# Patient Record
Sex: Female | Born: 1955 | Race: White | Hispanic: No | Marital: Married | Smoking: Former smoker
Health system: Southern US, Community
[De-identification: ages and names within clinical notes are randomized; demographics above are authoritative.]

## PROBLEM LIST (undated history)

## (undated) DIAGNOSIS — E039 Hypothyroidism, unspecified: Secondary | ICD-10-CM

## (undated) DIAGNOSIS — D649 Anemia, unspecified: Secondary | ICD-10-CM

## (undated) DIAGNOSIS — E785 Hyperlipidemia, unspecified: Secondary | ICD-10-CM

## (undated) DIAGNOSIS — I6529 Occlusion and stenosis of unspecified carotid artery: Secondary | ICD-10-CM

## (undated) DIAGNOSIS — F329 Major depressive disorder, single episode, unspecified: Secondary | ICD-10-CM

## (undated) DIAGNOSIS — I700 Atherosclerosis of aorta: Secondary | ICD-10-CM

## (undated) DIAGNOSIS — E559 Vitamin D deficiency, unspecified: Secondary | ICD-10-CM

## (undated) DIAGNOSIS — E8881 Metabolic syndrome: Secondary | ICD-10-CM

## (undated) DIAGNOSIS — I10 Essential (primary) hypertension: Secondary | ICD-10-CM

## (undated) DIAGNOSIS — F419 Anxiety disorder, unspecified: Secondary | ICD-10-CM

## (undated) DIAGNOSIS — M25569 Pain in unspecified knee: Secondary | ICD-10-CM

## (undated) DIAGNOSIS — K219 Gastro-esophageal reflux disease without esophagitis: Secondary | ICD-10-CM

## (undated) DIAGNOSIS — F32A Depression, unspecified: Secondary | ICD-10-CM

## (undated) DIAGNOSIS — R0600 Dyspnea, unspecified: Secondary | ICD-10-CM

## (undated) DIAGNOSIS — R3129 Other microscopic hematuria: Secondary | ICD-10-CM

## (undated) DIAGNOSIS — E079 Disorder of thyroid, unspecified: Secondary | ICD-10-CM

## (undated) DIAGNOSIS — M1712 Unilateral primary osteoarthritis, left knee: Secondary | ICD-10-CM

## (undated) HISTORY — DX: Pain in unspecified knee: M25.569

## (undated) HISTORY — DX: Occlusion and stenosis of unspecified carotid artery: I65.29

## (undated) HISTORY — PX: FOOT NEUROMA SURGERY: SHX646

## (undated) HISTORY — DX: Essential (primary) hypertension: I10

## (undated) HISTORY — DX: Metabolic syndrome: E88.810

## (undated) HISTORY — DX: Anxiety disorder, unspecified: F41.9

## (undated) HISTORY — DX: Depression, unspecified: F32.A

## (undated) HISTORY — DX: Other microscopic hematuria: R31.29

## (undated) HISTORY — DX: Vitamin D deficiency, unspecified: E55.9

## (undated) HISTORY — DX: Gastro-esophageal reflux disease without esophagitis: K21.9

## (undated) HISTORY — DX: Disorder of thyroid, unspecified: E07.9

## (undated) HISTORY — DX: Major depressive disorder, single episode, unspecified: F32.9

## (undated) HISTORY — DX: Metabolic syndrome: E88.81

## (undated) HISTORY — DX: Hyperlipidemia, unspecified: E78.5

## (undated) HISTORY — PX: COLONOSCOPY W/ POLYPECTOMY: SHX1380

## (undated) HISTORY — PX: APPENDECTOMY: SHX54

---

## 2005-12-05 LAB — HM PAP SMEAR

## 2007-10-01 ENCOUNTER — Ambulatory Visit: Payer: Self-pay | Admitting: Vascular Surgery

## 2009-06-15 HISTORY — PX: ENDOMETRIAL BIOPSY: SHX622

## 2011-02-13 LAB — HM DEXA SCAN

## 2012-12-27 ENCOUNTER — Ambulatory Visit: Payer: Self-pay | Admitting: Gastroenterology

## 2012-12-27 LAB — HM COLONOSCOPY

## 2013-11-18 ENCOUNTER — Ambulatory Visit: Payer: Self-pay

## 2015-07-13 ENCOUNTER — Other Ambulatory Visit: Payer: Self-pay

## 2015-07-13 NOTE — Telephone Encounter (Signed)
Patient called and stated she would run out of both of these medications before her appointment on 08/23/15. She would like enough to get her to the appointment. Pharmacy is [facility].

## 2015-07-14 MED ORDER — HYDROCHLOROTHIAZIDE 25 MG PO TABS: 25.0000 mg | ORAL_TABLET | Freq: Every day | ORAL | Status: DC

## 2015-07-14 MED ORDER — LOSARTAN POTASSIUM 50 MG PO TABS: 50.0000 mg | ORAL_TABLET | Freq: Every day | ORAL | Status: DC

## 2015-07-14 NOTE — Telephone Encounter (Signed)
Last labs were August 2015; reviewed One month approved

## 2015-08-06 DIAGNOSIS — I10 Essential (primary) hypertension: Secondary | ICD-10-CM

## 2015-08-06 DIAGNOSIS — F32A Depression, unspecified: Secondary | ICD-10-CM | POA: Insufficient documentation

## 2015-08-06 DIAGNOSIS — E559 Vitamin D deficiency, unspecified: Secondary | ICD-10-CM | POA: Insufficient documentation

## 2015-08-06 DIAGNOSIS — I6529 Occlusion and stenosis of unspecified carotid artery: Secondary | ICD-10-CM | POA: Insufficient documentation

## 2015-08-06 DIAGNOSIS — I129 Hypertensive chronic kidney disease with stage 1 through stage 4 chronic kidney disease, or unspecified chronic kidney disease: Secondary | ICD-10-CM | POA: Insufficient documentation

## 2015-08-06 DIAGNOSIS — E785 Hyperlipidemia, unspecified: Secondary | ICD-10-CM | POA: Insufficient documentation

## 2015-08-06 DIAGNOSIS — F419 Anxiety disorder, unspecified: Secondary | ICD-10-CM

## 2015-08-06 DIAGNOSIS — F411 Generalized anxiety disorder: Secondary | ICD-10-CM | POA: Insufficient documentation

## 2015-08-06 DIAGNOSIS — E063 Autoimmune thyroiditis: Secondary | ICD-10-CM | POA: Insufficient documentation

## 2015-08-06 DIAGNOSIS — E8881 Metabolic syndrome: Secondary | ICD-10-CM | POA: Insufficient documentation

## 2015-08-06 DIAGNOSIS — K219 Gastro-esophageal reflux disease without esophagitis: Secondary | ICD-10-CM | POA: Insufficient documentation

## 2015-08-06 DIAGNOSIS — E039 Hypothyroidism, unspecified: Secondary | ICD-10-CM | POA: Insufficient documentation

## 2015-08-06 DIAGNOSIS — M25569 Pain in unspecified knee: Secondary | ICD-10-CM | POA: Insufficient documentation

## 2015-08-06 DIAGNOSIS — F329 Major depressive disorder, single episode, unspecified: Secondary | ICD-10-CM

## 2015-08-06 DIAGNOSIS — R3129 Other microscopic hematuria: Secondary | ICD-10-CM | POA: Insufficient documentation

## 2015-08-09 ENCOUNTER — Ambulatory Visit (INDEPENDENT_AMBULATORY_CARE_PROVIDER_SITE_OTHER): Payer: 59 | Admitting: Unknown Physician Specialty

## 2015-08-09 ENCOUNTER — Encounter: Payer: Self-pay | Admitting: Unknown Physician Specialty

## 2015-08-09 VITALS — BP 109/72 | HR 65 | Temp 98.3°F | Ht 62.9 in | Wt 199.3 lb

## 2015-08-09 DIAGNOSIS — F418 Other specified anxiety disorders: Secondary | ICD-10-CM

## 2015-08-09 DIAGNOSIS — F32A Depression, unspecified: Secondary | ICD-10-CM

## 2015-08-09 DIAGNOSIS — I129 Hypertensive chronic kidney disease with stage 1 through stage 4 chronic kidney disease, or unspecified chronic kidney disease: Secondary | ICD-10-CM

## 2015-08-09 DIAGNOSIS — K219 Gastro-esophageal reflux disease without esophagitis: Secondary | ICD-10-CM

## 2015-08-09 DIAGNOSIS — I10 Essential (primary) hypertension: Secondary | ICD-10-CM | POA: Diagnosis not present

## 2015-08-09 DIAGNOSIS — F329 Major depressive disorder, single episode, unspecified: Secondary | ICD-10-CM

## 2015-08-09 DIAGNOSIS — E785 Hyperlipidemia, unspecified: Secondary | ICD-10-CM

## 2015-08-09 DIAGNOSIS — E039 Hypothyroidism, unspecified: Secondary | ICD-10-CM

## 2015-08-09 DIAGNOSIS — F419 Anxiety disorder, unspecified: Secondary | ICD-10-CM

## 2015-08-09 LAB — LIPID PANEL PICCOLO, WAIVED
CHOLESTEROL PICCOLO, WAIVED: 168 mg/dL (ref <200)
Chol/HDL Ratio Piccolo,Waive: 3.9 mg/dL
HDL CHOL PICCOLO, WAIVED: 43 mg/dL — AB (ref >59)
LDL Chol Calc Piccolo Waived: 76 mg/dL (ref <100)
TRIGLYCERIDES PICCOLO,WAIVED: 245 mg/dL — AB (ref <150)
VLDL Chol Calc Piccolo,Waive: 49 mg/dL — ABNORMAL HIGH (ref <30)

## 2015-08-09 LAB — MICROALBUMIN, URINE WAIVED
Creatinine, Urine Waived: 100 mg/dL (ref 10–300)
Microalb, Ur Waived: 10 mg/L (ref 0–19)

## 2015-08-09 LAB — BAYER DCA HB A1C WAIVED: HB A1C: 6.1 % (ref <7.0)

## 2015-08-09 MED ORDER — LEVOTHYROXINE SODIUM 112 MCG PO TABS: 112.0000 ug | ORAL_TABLET | Freq: Every day | ORAL | Status: DC

## 2015-08-09 MED ORDER — OMEPRAZOLE 20 MG PO CPDR: 20.0000 mg | DELAYED_RELEASE_CAPSULE | Freq: Every day | ORAL | Status: DC

## 2015-08-09 MED ORDER — CITALOPRAM HYDROBROMIDE 20 MG PO TABS: 20.0000 mg | ORAL_TABLET | Freq: Every day | ORAL | Status: DC

## 2015-08-09 MED ORDER — ATORVASTATIN CALCIUM 40 MG PO TABS: 40.0000 mg | ORAL_TABLET | Freq: Every day | ORAL | Status: DC

## 2015-08-09 MED ORDER — LOSARTAN POTASSIUM 50 MG PO TABS: 50.0000 mg | ORAL_TABLET | Freq: Every day | ORAL | Status: DC

## 2015-08-09 MED ORDER — HYDROCHLOROTHIAZIDE 25 MG PO TABS: 25.0000 mg | ORAL_TABLET | Freq: Every day | ORAL | Status: DC

## 2015-08-09 NOTE — Assessment & Plan Note (Addendum)
Comprehensive Metabolic Panel ordered results pending Uric Acid ordered results pending

## 2015-08-09 NOTE — Assessment & Plan Note (Signed)
Stable, continue present medications.   

## 2015-08-09 NOTE — Assessment & Plan Note (Addendum)
Lipid panel results reviewed LDL 76 Total Cholesterol 168 Stable, continue present medications.

## 2015-08-09 NOTE — Progress Notes (Signed)
BP 109/72 mmHg  Pulse 65  Temp(Src) 98.3 F (36.8 C)  Ht 5' 2.9" (1.598 m)  Wt 199 lb 4.8 oz (90.402 kg)  BMI 35.40 kg/m2  SpO2 97%  LMP  (LMP Unknown)   Subjective:    Patient ID: Leslie Turner, female    DOB: 1956/09/17, 59 y.o.   MRN: [id number]  HPI: SHAKINAH NAVIS is a 59 y.o. female  Chief Complaint  Patient presents with  . Annual Exam   The pt presents for her annual exam her only complaint is intermittent  hot flashes onset 6 weeks ago.  She has not had a menstrual cycle in 5 years. Nothing makes the problem better or worse.    Hypertension/Hyperlipidemia This is a chronic problem medication compliance is excellent.  She is satisfied with the current treatment.  She does not check her blood pressure at home.  Pertinent negatives denies chest pain, shortness of breath, palpitations, edema, or headaches  Hypothyroidism This is a chronic problem medication compliance is excellent.  She is satisfied with the current treatment.  Pertinent negatives denies cold or heat intolerances, weight loss or gain, or fatigue  Depression This is a chronic problem medication compliance is excellent.  She is satisfied with the current treatment.  Pertinent negatives denies anxiety, suicidal ideation, confusion, or self-injury.  PHQ-9 results 2  Relevant past medical, surgical, family and social history reviewed and updated as indicated. Interim medical history since our last visit reviewed. Allergies and medications reviewed and updated.  Review of Systems  Constitutional: Negative.   HENT: Negative.   Eyes: Negative.   Respiratory: Negative.   Cardiovascular: Negative.   Gastrointestinal: Negative.   Endocrine: Negative.   Genitourinary: Negative.   Musculoskeletal: Negative.   Skin: Negative.   Allergic/Immunologic: Negative.   Neurological: Negative.   Hematological: Negative.   Psychiatric/Behavioral: Negative.     Per HPI unless specifically indicated above      Objective:    BP 109/72 mmHg  Pulse 65  Temp(Src) 98.3 F (36.8 C)  Ht 5' 2.9" (1.598 m)  Wt 199 lb 4.8 oz (90.402 kg)  BMI 35.40 kg/m2  SpO2 97%  LMP  (LMP Unknown)  Wt Readings from Last 3 Encounters:  08/09/15 199 lb 4.8 oz (90.402 kg)  01/08/15 200 lb (90.719 kg)    Physical Exam  Constitutional: She is oriented to person, place, and time. She appears well-developed and well-nourished. No distress.  HENT:  Head: Normocephalic and atraumatic.  Right Ear: External ear normal.  Left Ear: External ear normal.  Nose: Nose normal.  Mouth/Throat: Oropharynx is clear and moist. No oropharyngeal exudate.  Eyes: Pupils are equal, round, and reactive to light.  Neck: Normal range of motion. Neck supple. No JVD present. No thyromegaly present.  Cardiovascular: Normal rate, regular rhythm, normal heart sounds and intact distal pulses.   Pulmonary/Chest: Effort normal and breath sounds normal. No respiratory distress. She has no wheezes. She has no rales.  Abdominal: Soft. Bowel sounds are normal. She exhibits no distension and no mass. There is no tenderness. There is no rebound and no guarding.  Genitourinary: No breast swelling, tenderness, discharge or bleeding.  Bilateral breast no masses or discharge present   Musculoskeletal: She exhibits no edema or tenderness.  Lymphadenopathy:    She has no cervical adenopathy.  Neurological: She is alert and oriented to person, place, and time.  Skin: Skin is warm and dry. No rash noted. She is not diaphoretic. No erythema. No pallor.  Psychiatric: She has a normal mood and affect. Her behavior is normal. Judgment and thought content normal.    Results for orders placed or performed in visit on 08/06/15  HM DEXA SCAN  Result Value Ref Range   HM Dexa Scan from PP   HM PAP SMEAR  Result Value Ref Range   HM Pap smear from PP   HM COLONOSCOPY  Result Value Ref Range   HM Colonoscopy from PP       Assessment & Plan:   Problem List  Items Addressed This Visit      Unprioritized   Anxiety and depression    Stable, continue present medications.        Hyperlipidemia - Primary    Lipid panel results reviewed LDL 76 Total Cholesterol 168 Stable, continue present medications.        Relevant Medications   atorvastatin (LIPITOR) 40 MG tablet   hydrochlorothiazide (HYDRODIURIL) 25 MG tablet   losartan (COZAAR) 50 MG tablet   Other Relevant Orders   Bayer DCA Hb A1c Waived   Lipid Panel Piccolo, Waived   Hypothyroidism    TSH ordered results pending      Relevant Medications   levothyroxine (SYNTHROID, LEVOTHROID) 112 MCG tablet   Other Relevant Orders   TSH   GERD (gastroesophageal reflux disease)    Stable, continue present medications.        Relevant Medications   omeprazole (PRILOSEC) 20 MG capsule   Hypertensive CKD (chronic kidney disease)    Comprehensive Metabolic Panel ordered results pending Uric Acid ordered results pending        Relevant Orders   Comprehensive metabolic panel   Microalbumin, Urine Waived   Uric acid    Other Visit Diagnoses    Essential hypertension        Relevant Medications    atorvastatin (LIPITOR) 40 MG tablet    hydrochlorothiazide (HYDRODIURIL) 25 MG tablet    losartan (COZAAR) 50 MG tablet        Follow up plan: Return in about 6 months (around 02/07/2016).

## 2015-08-09 NOTE — Assessment & Plan Note (Signed)
TSH ordered results pending

## 2015-08-10 ENCOUNTER — Encounter: Payer: Self-pay | Admitting: Unknown Physician Specialty

## 2015-08-10 LAB — COMPREHENSIVE METABOLIC PANEL
ALT: 14 IU/L (ref 0–32)
AST: 18 IU/L (ref 0–40)
Albumin/Globulin Ratio: 1.7 (ref 1.1–2.5)
Albumin: 4.2 g/dL (ref 3.5–5.5)
Alkaline Phosphatase: 92 IU/L (ref 39–117)
BUN/Creatinine Ratio: 19 (ref 9–23)
BUN: 15 mg/dL (ref 6–24)
Bilirubin Total: 0.5 mg/dL (ref 0.0–1.2)
CALCIUM: 9 mg/dL (ref 8.7–10.2)
CO2: 24 mmol/L (ref 18–29)
CREATININE: 0.77 mg/dL (ref 0.57–1.00)
Chloride: 99 mmol/L (ref 97–108)
GFR, EST AFRICAN AMERICAN: 98 mL/min/{1.73_m2} (ref >59)
GFR, EST NON AFRICAN AMERICAN: 85 mL/min/{1.73_m2} (ref >59)
GLUCOSE: 90 mg/dL (ref 65–99)
Globulin, Total: 2.5 g/dL (ref 1.5–4.5)
Potassium: 3.9 mmol/L (ref 3.5–5.2)
Sodium: 141 mmol/L (ref 134–144)
TOTAL PROTEIN: 6.7 g/dL (ref 6.0–8.5)

## 2015-08-10 LAB — URIC ACID: Uric Acid: 5.2 mg/dL (ref 2.5–7.1)

## 2015-08-10 LAB — TSH: TSH: 1.35 u[IU]/mL (ref 0.450–4.500)

## 2015-08-10 NOTE — Progress Notes (Signed)
Quick Note:  Normal labs. Patient notified by letter. ______ 

## 2015-08-23 ENCOUNTER — Ambulatory Visit: Payer: Self-pay | Admitting: Unknown Physician Specialty

## 2015-09-29 ENCOUNTER — Other Ambulatory Visit: Payer: Self-pay | Admitting: Unknown Physician Specialty

## 2015-12-20 ENCOUNTER — Other Ambulatory Visit: Payer: Self-pay | Admitting: Unknown Physician Specialty

## 2016-01-16 ENCOUNTER — Other Ambulatory Visit: Payer: Self-pay | Admitting: Unknown Physician Specialty

## 2016-01-17 ENCOUNTER — Other Ambulatory Visit: Payer: Self-pay

## 2016-01-17 MED ORDER — HYDROCHLOROTHIAZIDE 25 MG PO TABS: 25.0000 mg | ORAL_TABLET | Freq: Every day | ORAL | Status: DC

## 2016-01-17 MED ORDER — LOSARTAN POTASSIUM 50 MG PO TABS: 50.0000 mg | ORAL_TABLET | Freq: Every day | ORAL | Status: DC

## 2016-01-17 MED ORDER — LEVOTHYROXINE SODIUM 112 MCG PO TABS: 112.0000 ug | ORAL_TABLET | Freq: Every day | ORAL | Status: DC

## 2016-01-17 NOTE — Telephone Encounter (Signed)
Patient called and left a voicemail about needing some refills sent in to a local pharmacy for her for her. I need clarification about the medications from the patient so I called and left her a voicemail asking for her to please return my call.

## 2016-01-17 NOTE — Telephone Encounter (Signed)
Patient returned call. She states there was a mix up with her pharmacies. She would like a one month supply of HCTZ and losartan sent to [facility]. She has an upcoming appointment 02/07/16.

## 2016-01-17 NOTE — Telephone Encounter (Signed)
Patient needs regular refills of these medications sent to [facility]. Has upcoming appointment 02/07/16.

## 2016-02-07 ENCOUNTER — Encounter: Payer: Self-pay | Admitting: Unknown Physician Specialty

## 2016-02-07 ENCOUNTER — Ambulatory Visit (INDEPENDENT_AMBULATORY_CARE_PROVIDER_SITE_OTHER): Payer: 59 | Admitting: Unknown Physician Specialty

## 2016-02-07 VITALS — BP 107/69 | HR 63 | Temp 98.3°F | Ht 63.0 in | Wt 203.6 lb

## 2016-02-07 DIAGNOSIS — E785 Hyperlipidemia, unspecified: Secondary | ICD-10-CM | POA: Diagnosis not present

## 2016-02-07 DIAGNOSIS — E039 Hypothyroidism, unspecified: Secondary | ICD-10-CM | POA: Diagnosis not present

## 2016-02-07 DIAGNOSIS — F32A Depression, unspecified: Secondary | ICD-10-CM

## 2016-02-07 DIAGNOSIS — F418 Other specified anxiety disorders: Secondary | ICD-10-CM | POA: Diagnosis not present

## 2016-02-07 DIAGNOSIS — I129 Hypertensive chronic kidney disease with stage 1 through stage 4 chronic kidney disease, or unspecified chronic kidney disease: Secondary | ICD-10-CM | POA: Diagnosis not present

## 2016-02-07 DIAGNOSIS — F329 Major depressive disorder, single episode, unspecified: Secondary | ICD-10-CM

## 2016-02-07 DIAGNOSIS — F419 Anxiety disorder, unspecified: Secondary | ICD-10-CM

## 2016-02-07 NOTE — Assessment & Plan Note (Signed)
Stable, continue present medications.   

## 2016-02-07 NOTE — Progress Notes (Signed)
BP 107/69 mmHg  Pulse 63  Temp(Src) 98.3 F (36.8 C)  Ht 5\' 3"  (1.6 m)  Wt 203 lb 9.6 oz (92.352 kg)  BMI 36.08 kg/m2  SpO2 97%  LMP  (LMP Unknown)   Subjective:    Patient ID: Leslie Turner, female    DOB: 14-Nov-1955, 60 y.o.   MRN: [id number]  HPI: MARTIN UMPIERRE is a 60 y.o. female  Chief Complaint  Patient presents with  . Depression  . Hyperlipidemia  . Hypertension  . Hypothyroidism   Depression: This is a chronic problem. Excellent medication compliance and patient is satisfied with current treatment. Denies anxiety, suicidal ideation, confusion, or self-injury. PHQ-9 results 1.   Hyperlipidemia Using medications without problems No Muscle aches Diet compliance: eats both out and at home, patient states she eats healthy for the most part  Exercise: walks dogs few times a week  Hypertension Using medications without difficulty Average home BPs: doesn't take  No problems or lightheadedness No chest pain with exertion or shortness of breath No Edema  Hypothyroidism: This is chronic problem and medication compliance is excellent. Patient satisfied with current treatment. Patient denies cold intolerance, weight loss/gain, fatigue, dry skin, or constipation.      Relevant past medical, surgical, family and social history reviewed and updated as indicated. Interim medical history since our last visit reviewed. Allergies and medications reviewed and updated.  Review of Systems  Constitutional: Negative.   HENT: Negative.   Eyes: Negative.   Respiratory: Negative.   Cardiovascular: Negative.   Gastrointestinal: Negative.   Endocrine: Negative.   Genitourinary: Negative.   Musculoskeletal: Negative.   Skin: Negative.   Allergic/Immunologic: Negative.   Neurological: Negative.   Hematological: Negative.   Psychiatric/Behavioral: Negative.     Per HPI unless specifically indicated above     Objective:    BP 107/69 mmHg  Pulse 63  Temp(Src) 98.3  F (36.8 C)  Ht 5\' 3"  (1.6 m)  Wt 203 lb 9.6 oz (92.352 kg)  BMI 36.08 kg/m2  SpO2 97%  LMP  (LMP Unknown)  Wt Readings from Last 3 Encounters:  02/07/16 203 lb 9.6 oz (92.352 kg)  08/09/15 199 lb 4.8 oz (90.402 kg)  01/08/15 200 lb (90.719 kg)    Physical Exam  Constitutional: She is oriented to person, place, and time. She appears well-developed and well-nourished. No distress.  HENT:  Head: Normocephalic and atraumatic.  Eyes: Conjunctivae and lids are normal. Right eye exhibits no discharge. Left eye exhibits no discharge. No scleral icterus.  Neck: Normal range of motion. Neck supple. No JVD present. Carotid bruit is not present.  Cardiovascular: Normal rate, regular rhythm and normal heart sounds.   Pulmonary/Chest: Effort normal and breath sounds normal.  Abdominal: Normal appearance. There is no splenomegaly or hepatomegaly.  Musculoskeletal: Normal range of motion.  Neurological: She is alert and oriented to person, place, and time.  Skin: Skin is warm, dry and intact. No rash noted. No pallor.  Psychiatric: She has a normal mood and affect. Her behavior is normal. Judgment and thought content normal.        Assessment & Plan:   Problem List Items Addressed This Visit      Unprioritized   Anxiety and depression    Stable, continue present medications.        Hyperlipidemia    Stable, continue present medications. Will check lipid panel at annual physical.        Hypertensive CKD (chronic kidney disease)  Stable, continue present medications.        Hypothyroidism - Primary    Stable, continue present medications.            Follow up plan: Return in about 6 months (around 08/08/2016) for annual physical.

## 2016-02-07 NOTE — Assessment & Plan Note (Signed)
Stable, continue present medications. Will check lipid panel at annual physical.

## 2016-05-01 DIAGNOSIS — M170 Bilateral primary osteoarthritis of knee: Secondary | ICD-10-CM | POA: Insufficient documentation

## 2016-07-15 ENCOUNTER — Other Ambulatory Visit: Payer: Self-pay | Admitting: Unknown Physician Specialty

## 2016-08-07 ENCOUNTER — Other Ambulatory Visit: Payer: Self-pay | Admitting: Unknown Physician Specialty

## 2016-08-09 ENCOUNTER — Encounter: Payer: Self-pay | Admitting: Unknown Physician Specialty

## 2016-08-09 ENCOUNTER — Ambulatory Visit (INDEPENDENT_AMBULATORY_CARE_PROVIDER_SITE_OTHER): Payer: 59 | Admitting: Unknown Physician Specialty

## 2016-08-09 VITALS — BP 113/74 | HR 64 | Temp 98.0°F | Ht 63.6 in | Wt 199.0 lb

## 2016-08-09 DIAGNOSIS — E039 Hypothyroidism, unspecified: Secondary | ICD-10-CM | POA: Diagnosis not present

## 2016-08-09 DIAGNOSIS — I129 Hypertensive chronic kidney disease with stage 1 through stage 4 chronic kidney disease, or unspecified chronic kidney disease: Secondary | ICD-10-CM

## 2016-08-09 DIAGNOSIS — Z0000 Encounter for general adult medical examination without abnormal findings: Secondary | ICD-10-CM | POA: Diagnosis not present

## 2016-08-09 DIAGNOSIS — E7800 Pure hypercholesterolemia, unspecified: Secondary | ICD-10-CM | POA: Diagnosis not present

## 2016-08-09 LAB — MICROALBUMIN, URINE WAIVED
Creatinine, Urine Waived: 300 mg/dL (ref 10–300)
Microalb, Ur Waived: 30 mg/L — ABNORMAL HIGH (ref 0–19)
Microalb/Creat Ratio: <30 mg/g

## 2016-08-09 MED ORDER — ATORVASTATIN CALCIUM 40 MG PO TABS: 40.0000 mg | ORAL_TABLET | Freq: Every day | ORAL | 3 refills | Status: DC

## 2016-08-09 MED ORDER — OMEPRAZOLE 20 MG PO CPDR
20.0000 mg | DELAYED_RELEASE_CAPSULE | Freq: Every day | ORAL | 3 refills | Status: DC
Start: 2016-08-09 — End: 2017-08-21

## 2016-08-09 MED ORDER — LOSARTAN POTASSIUM 50 MG PO TABS: 50.0000 mg | ORAL_TABLET | Freq: Every day | ORAL | 3 refills | Status: DC

## 2016-08-09 MED ORDER — HYDROCHLOROTHIAZIDE 25 MG PO TABS: 25.0000 mg | ORAL_TABLET | Freq: Every day | ORAL | 3 refills | Status: DC

## 2016-08-09 MED ORDER — LEVOTHYROXINE SODIUM 112 MCG PO TABS: 112.0000 ug | ORAL_TABLET | Freq: Every day | ORAL | 3 refills | Status: DC

## 2016-08-09 NOTE — Progress Notes (Signed)
BP 113/74 (BP Location: Left Arm, Patient Position: Sitting, Cuff Size: Large)   Pulse 64   Temp 98 F (36.7 C)   Ht 5' 3.6" (1.615 m)   Wt 199 lb (90.3 kg)   LMP  (LMP Unknown)   SpO2 96%   BMI 34.59 kg/m    Subjective:    Patient ID: Leslie Turner, female    DOB: [birth year], 60 y.o.   MRN: [id number]  HPI: Leslie Turner is a 60 y.o. female  Chief Complaint  Patient presents with  . Hyperlipidemia  . Hypertension  . Hypothyroidism  . Depression   Hyperlipidemia Using medications without problems No Muscle aches Diet compliance: eats both out and at home, patient states she eats healthy for the most part.  She has cut back on sald Exercise: walks dogs few times a week  Hypertension Using medications without difficulty Average home BPs: doesn't take        No problems or lightheadedness No chest pain with exertion or shortness of breath No Edema  Hypothyroidism  This is chronic problem and medication compliance is excellent. Patient satisfied with current treatment. Patient denies cold intolerance, weight loss/gain, fatigue, dry skin, or constipation.   Relevant past medical, surgical, family and social history reviewed and updated as indicated. Interim medical history since our last visit reviewed. Allergies and medications reviewed and updated.  Review of Systems  Constitutional: Negative.   HENT: Negative.   Eyes: Negative.   Respiratory: Negative.   Cardiovascular: Negative.   Gastrointestinal: Negative.   Endocrine: Negative.   Genitourinary: Negative.   Musculoskeletal: Negative.   Skin: Negative.   Allergic/Immunologic: Negative.   Neurological: Negative.   Hematological: Negative.   Psychiatric/Behavioral: Negative.     Per HPI unless specifically indicated above     Objective:    BP 113/74 (BP Location: Left Arm, Patient Position: Sitting, Cuff Size: Large)   Pulse 64   Temp 98 F (36.7 C)   Ht 5' 3.6" (1.615 m)   Wt 199 lb (90.3  kg)   LMP  (LMP Unknown)   SpO2 96%   BMI 34.59 kg/m   Wt Readings from Last 3 Encounters:  08/09/16 199 lb (90.3 kg)  02/07/16 203 lb 9.6 oz (92.4 kg)  08/09/15 199 lb 4.8 oz (90.4 kg)    Physical Exam  Constitutional: She is oriented to person, place, and time. She appears well-developed and well-nourished. No distress.  HENT:  Head: Normocephalic and atraumatic.  Eyes: Conjunctivae and lids are normal. Right eye exhibits no discharge. Left eye exhibits no discharge. No scleral icterus.  Neck: Normal range of motion. Neck supple. No JVD present. Carotid bruit is not present.  Cardiovascular: Normal rate, regular rhythm and normal heart sounds.   Pulmonary/Chest: Effort normal and breath sounds normal.  Abdominal: Normal appearance. There is no splenomegaly or hepatomegaly.  Musculoskeletal: Normal range of motion.  Neurological: She is alert and oriented to person, place, and time.  Skin: Skin is warm, dry and intact. No rash noted. No pallor.  Psychiatric: She has a normal mood and affect. Her behavior is normal. Judgment and thought content normal.    Results for orders placed or performed in visit on 08/09/15  Comprehensive metabolic panel  Result Value Ref Range   Glucose 90 65 - 99 mg/dL   BUN 15 6 - 24 mg/dL   Creatinine, Ser 0.77 0.57 - 1.00 mg/dL   GFR calc non Af Amer 85 >59 mL/min/1.73   GFR  calc Af Amer 98 >59 mL/min/1.73   BUN/Creatinine Ratio 19 9 - 23   Sodium 141 134 - 144 mmol/L   Potassium 3.9 3.5 - 5.2 mmol/L   Chloride 99 97 - 108 mmol/L   CO2 24 18 - 29 mmol/L   Calcium 9.0 8.7 - 10.2 mg/dL   Total Protein 6.7 6.0 - 8.5 g/dL   Albumin 4.2 3.5 - 5.5 g/dL   Globulin, Total 2.5 1.5 - 4.5 g/dL   Albumin/Globulin Ratio 1.7 1.1 - 2.5   Bilirubin Total 0.5 0.0 - 1.2 mg/dL   Alkaline Phosphatase 92 39 - 117 IU/L   AST 18 0 - 40 IU/L   ALT 14 0 - 32 IU/L  Microalbumin, Urine Waived  Result Value Ref Range   Microalb, Ur Waived 10 0 - 19 mg/L    Creatinine, Urine Waived 100 10 - 300 mg/dL   Microalb/Creat Ratio <30 <30 mg/g  Uric acid  Result Value Ref Range   Uric Acid 5.2 2.5 - 7.1 mg/dL  Bayer DCA Hb A1c Waived  Result Value Ref Range   Bayer DCA Hb A1c Waived 6.1 <7.0 %  Lipid Panel Piccolo, Waived  Result Value Ref Range   Cholesterol Piccolo, Waived 168 <200 mg/dL   HDL Chol Piccolo, Waived 43 (L) >59 mg/dL   Triglycerides Piccolo,Waived 245 (H) <150 mg/dL   Chol/HDL Ratio Piccolo,Waive 3.9 mg/dL   LDL Chol Calc Piccolo Waived 76 <100 mg/dL   VLDL Chol Calc Piccolo,Waive 49 (H) <30 mg/dL  TSH  Result Value Ref Range   TSH 1.350 0.450 - 4.500 uIU/mL      Assessment & Plan:   Problem List Items Addressed This Visit    None    Visit Diagnoses    Health care maintenance    -  Primary   Relevant Orders   Hepatitis C antibody   HIV antibody       Follow up plan: No Follow-up on file.

## 2016-08-10 LAB — COMPREHENSIVE METABOLIC PANEL
ALBUMIN: 4.5 g/dL (ref 3.5–5.5)
ALK PHOS: 92 IU/L (ref 39–117)
ALT: 11 IU/L (ref 0–32)
AST: 14 IU/L (ref 0–40)
Albumin/Globulin Ratio: 2 (ref 1.2–2.2)
BUN / CREAT RATIO: 21 (ref 9–23)
BUN: 18 mg/dL (ref 6–24)
Bilirubin Total: 0.6 mg/dL (ref 0.0–1.2)
CO2: 25 mmol/L (ref 18–29)
CREATININE: 0.86 mg/dL (ref 0.57–1.00)
Calcium: 9.2 mg/dL (ref 8.7–10.2)
Chloride: 98 mmol/L (ref 96–106)
GFR calc non Af Amer: 74 mL/min/{1.73_m2} (ref >59)
GFR, EST AFRICAN AMERICAN: 86 mL/min/{1.73_m2} (ref >59)
GLOBULIN, TOTAL: 2.2 g/dL (ref 1.5–4.5)
Glucose: 89 mg/dL (ref 65–99)
Potassium: 3.7 mmol/L (ref 3.5–5.2)
SODIUM: 140 mmol/L (ref 134–144)
TOTAL PROTEIN: 6.7 g/dL (ref 6.0–8.5)

## 2016-08-10 LAB — LIPID PANEL W/O CHOL/HDL RATIO
CHOLESTEROL TOTAL: 162 mg/dL (ref 100–199)
HDL: 42 mg/dL (ref >39)
LDL CALC: 81 mg/dL (ref 0–99)
Triglycerides: 195 mg/dL — ABNORMAL HIGH (ref 0–149)
VLDL CHOLESTEROL CAL: 39 mg/dL (ref 5–40)

## 2016-08-10 LAB — CBC WITH DIFFERENTIAL/PLATELET
BASOS ABS: 0 10*3/uL (ref 0.0–0.2)
Basos: 0 %
EOS (ABSOLUTE): 0.4 10*3/uL (ref 0.0–0.4)
Eos: 5 %
HEMOGLOBIN: 13.4 g/dL (ref 11.1–15.9)
Hematocrit: 40.1 % (ref 34.0–46.6)
IMMATURE GRANS (ABS): 0 10*3/uL (ref 0.0–0.1)
IMMATURE GRANULOCYTES: 0 %
LYMPHS: 38 %
Lymphocytes Absolute: 3 10*3/uL (ref 0.7–3.1)
MCH: 29.6 pg (ref 26.6–33.0)
MCHC: 33.4 g/dL (ref 31.5–35.7)
MCV: 89 fL (ref 79–97)
MONOCYTES: 8 %
Monocytes Absolute: 0.6 10*3/uL (ref 0.1–0.9)
NEUTROS ABS: 3.9 10*3/uL (ref 1.4–7.0)
NEUTROS PCT: 49 %
PLATELETS: 294 10*3/uL (ref 150–379)
RBC: 4.53 x10E6/uL (ref 3.77–5.28)
RDW: 13.4 % (ref 12.3–15.4)
WBC: 7.9 10*3/uL (ref 3.4–10.8)

## 2016-08-10 LAB — URIC ACID: URIC ACID: 5.1 mg/dL (ref 2.5–7.1)

## 2016-08-10 LAB — HIV ANTIBODY (ROUTINE TESTING W REFLEX): HIV Screen 4th Generation wRfx: NONREACTIVE

## 2016-08-10 LAB — HEPATITIS C ANTIBODY: Hep C Virus Ab: <0.1 s/co ratio (ref 0.0–0.9)

## 2016-08-10 LAB — TSH: TSH: 0.54 u[IU]/mL (ref 0.450–4.500)

## 2016-08-11 ENCOUNTER — Encounter: Payer: Self-pay | Admitting: Unknown Physician Specialty

## 2016-09-13 ENCOUNTER — Ambulatory Visit (INDEPENDENT_AMBULATORY_CARE_PROVIDER_SITE_OTHER): Payer: 59 | Admitting: Family Medicine

## 2016-09-13 ENCOUNTER — Encounter: Payer: Self-pay | Admitting: Family Medicine

## 2016-09-13 VITALS — BP 131/82 | HR 62 | Temp 98.1°F | Wt 201.0 lb

## 2016-09-13 DIAGNOSIS — D489 Neoplasm of uncertain behavior, unspecified: Secondary | ICD-10-CM | POA: Diagnosis not present

## 2016-09-13 DIAGNOSIS — L03313 Cellulitis of chest wall: Secondary | ICD-10-CM

## 2016-09-13 MED ORDER — SULFAMETHOXAZOLE-TRIMETHOPRIM 800-160 MG PO TABS: 1.0000 | ORAL_TABLET | Freq: Two times a day (BID) | ORAL | 0 refills | Status: DC

## 2016-09-13 NOTE — Progress Notes (Signed)
BP 131/82   Pulse 62   Temp 98.1 F (36.7 C)   Wt 201 lb (91.2 kg)   LMP  (LMP Unknown)   SpO2 96%   BMI 34.94 kg/m    Subjective:    Patient ID: Leslie Turner, female    DOB: May 29, 1956, 60 y.o.   MRN: [id number]  HPI: Leslie Turner is a 60 y.o. female  Chief Complaint  Patient presents with  . Insect Bite    on her left breast x 1 week. Red, angry looking.    Patient presents with 1 week history of infected bug bite on left breast. Has been treating it with neosporin and keeping the area clean and covered. Area has become red, swollen, and painful with purulent drainage from the opening.   Pt also wanted a place on her left low back looked at that she's recently noticed. States she never had a mole there in the past but now has a raised place there that keeps catching on her clothes and itching.   Relevant past medical, surgical, family and social history reviewed and updated as indicated. Interim medical history since our last visit reviewed. Allergies and medications reviewed and updated.  Review of Systems  Constitutional: Negative for fever.  HENT: Negative.   Eyes: Negative.   Respiratory: Negative.   Cardiovascular: Negative.   Gastrointestinal: Negative.   Genitourinary: Negative.   Musculoskeletal: Negative.   Skin: Positive for wound.       Neoplasm of left back  Neurological: Negative.   Psychiatric/Behavioral: Negative.     Per HPI unless specifically indicated above     Objective:    BP 131/82   Pulse 62   Temp 98.1 F (36.7 C)   Wt 201 lb (91.2 kg)   LMP  (LMP Unknown)   SpO2 96%   BMI 34.94 kg/m   Wt Readings from Last 3 Encounters:  09/13/16 201 lb (91.2 kg)  08/09/16 199 lb (90.3 kg)  02/07/16 203 lb 9.6 oz (92.4 kg)    Physical Exam  Constitutional: She is oriented to person, place, and time. She appears well-developed and well-nourished. No distress.  HENT:  Head: Atraumatic.  Eyes: Conjunctivae are normal. Pupils are  equal, round, and reactive to light. No scleral icterus.  Neck: Normal range of motion. Neck supple.  Cardiovascular: Normal rate and normal heart sounds.   Pulmonary/Chest: Effort normal. No respiratory distress.  Musculoskeletal: Normal range of motion.  Lymphadenopathy:    She has no cervical adenopathy.  Neurological: She is alert and oriented to person, place, and time.  Skin: Skin is warm and dry.  Area of warmth, erythema, and purulence on lateral left breast at site of insect bite  Flesh toned rough papule about .5 cm diameter on left low back  Psychiatric: She has a normal mood and affect. Her behavior is normal.  Nursing note and vitals reviewed.  Procedure:  Shave bx, left low back neoplasm Area was prepped with alcohol pad and numbed with 2 cc of lidocaine with epinephrine. A scalpel and forceps were used to shave the neoplasm. Silver nitrate stick was used for cautery. Area was dressed with neosporin and a bandage. Patient tolerated procedure well with no immediate complications. Wound care was discussed.      Assessment & Plan:   Problem List Items Addressed This Visit    None    Visit Diagnoses    Cellulitis of chest wall    -  Primary  Bactrim sent, continue dressing with neosporin and bandage. Follow up if not completely resolved once abx are complete   Neoplasm of uncertain behavior       Shave bx done today. Await lab results. Wound care discussed   Relevant Orders   Pathology Report       Follow up plan: Return if symptoms worsen or fail to improve.

## 2016-09-13 NOTE — Patient Instructions (Signed)
Follow up as needed

## 2016-09-16 LAB — PATHOLOGY

## 2016-09-18 ENCOUNTER — Telehealth: Payer: Self-pay | Admitting: Family Medicine

## 2016-09-18 NOTE — Telephone Encounter (Signed)
Called and left patient a VM (it was personalized) letting her know about results. I asked for her to please give [place] a call if she has any questions.

## 2016-09-18 NOTE — Telephone Encounter (Signed)
Please call pt and let her know that the place we biopsied on her low back came back as an irritated keratosis - completely benign and nothing further to do!

## 2017-02-07 ENCOUNTER — Ambulatory Visit (INDEPENDENT_AMBULATORY_CARE_PROVIDER_SITE_OTHER): Payer: 59 | Admitting: Unknown Physician Specialty

## 2017-02-07 ENCOUNTER — Encounter: Payer: Self-pay | Admitting: Unknown Physician Specialty

## 2017-02-07 DIAGNOSIS — E7800 Pure hypercholesterolemia, unspecified: Secondary | ICD-10-CM | POA: Diagnosis not present

## 2017-02-07 DIAGNOSIS — I129 Hypertensive chronic kidney disease with stage 1 through stage 4 chronic kidney disease, or unspecified chronic kidney disease: Secondary | ICD-10-CM | POA: Diagnosis not present

## 2017-02-07 DIAGNOSIS — M25569 Pain in unspecified knee: Secondary | ICD-10-CM | POA: Diagnosis not present

## 2017-02-07 DIAGNOSIS — E039 Hypothyroidism, unspecified: Secondary | ICD-10-CM | POA: Diagnosis not present

## 2017-02-07 DIAGNOSIS — G8929 Other chronic pain: Secondary | ICD-10-CM | POA: Diagnosis not present

## 2017-02-07 MED ORDER — MELOXICAM 7.5 MG PO TABS: 7.5000 mg | ORAL_TABLET | Freq: Every day | ORAL | 3 refills | Status: DC

## 2017-02-07 NOTE — Assessment & Plan Note (Signed)
Stable, continue present medications.   

## 2017-02-07 NOTE — Assessment & Plan Note (Signed)
Due to OA.  Continue current meds

## 2017-02-07 NOTE — Progress Notes (Signed)
BP 132/85 (BP Location: Left Arm, Patient Position: Sitting, Cuff Size: Large)   Pulse 67   Temp 98.6 F (37 C)   Ht 5' 3.5" (1.613 m)   Wt 203 lb 14.4 oz (92.5 kg)   LMP  (LMP Unknown)   SpO2 95%   BMI 35.55 kg/m    Subjective:    Patient ID: Leslie Turner, female    DOB: Feb 20, 1956, 61 y.o.   MRN: [id number]  HPI: Leslie Turner is a 61 y.o. female  Chief Complaint  Patient presents with  . Depression  . Hyperlipidemia  . Hypertension  . Hypothyroidism   Hyperlipidemia Using medications without problems No Muscle aches Diet compliance: eats both out and at home, patient states she eats healthy for the most part.   Exercise: walks dogs few times a week lately as she has her grand dogs.    Hypertension Using medications without difficulty Average home BPs: No checking Noproblems or lightheadedness No chest pain with exertionor shortness of breath No Edema  Hypothyroidism  This is chronic problem and medication compliance is excellent. Patient satisfied with current treatment. Patient denies cold intolerance, weight loss/gain, fatigue, dry skin, or constipation.   Knee OA Seeing Dr. Prescott Parma at [facility].  Taking Mobic.  Would like me to prescribe  Relevant past medical, surgical, family and social history reviewed and updated as indicated. Interim medical history since our last visit reviewed. Allergies and medications reviewed and updated.  Review of Systems  Per HPI unless specifically indicated above     Objective:    BP 132/85 (BP Location: Left Arm, Patient Position: Sitting, Cuff Size: Large)   Pulse 67   Temp 98.6 F (37 C)   Ht 5' 3.5" (1.613 m)   Wt 203 lb 14.4 oz (92.5 kg)   LMP  (LMP Unknown)   SpO2 95%   BMI 35.55 kg/m   Wt Readings from Last 3 Encounters:  02/07/17 203 lb 14.4 oz (92.5 kg)  09/13/16 201 lb (91.2 kg)  08/09/16 199 lb (90.3 kg)    Physical Exam  Constitutional: She is oriented to person, place, and  time. She appears well-developed and well-nourished. No distress.  HENT:  Head: Normocephalic and atraumatic.  Eyes: Conjunctivae and lids are normal. Right eye exhibits no discharge. Left eye exhibits no discharge. No scleral icterus.  Neck: Normal range of motion. Neck supple. No JVD present. Carotid bruit is not present.  Cardiovascular: Normal rate, regular rhythm and normal heart sounds.   Pulmonary/Chest: Effort normal and breath sounds normal.  Abdominal: Normal appearance. There is no splenomegaly or hepatomegaly.  Musculoskeletal: Normal range of motion.  Neurological: She is alert and oriented to person, place, and time.  Skin: Skin is warm, dry and intact. No rash noted. No pallor.  Psychiatric: She has a normal mood and affect. Her behavior is normal. Judgment and thought content normal.   Reviewed labs.  All were normal    Assessment & Plan:   Problem List Items Addressed This Visit      Unprioritized   Hyperlipidemia    Stable, continue present medications.        Hypertensive CKD (chronic kidney disease)    Stable, continue present medications.        Hypothyroidism    Stable, continue present medications.        Pain in knee    Due to OA.  Continue current meds          Follow up  plan: Return in about 6 months (around 08/09/2017).

## 2017-02-16 ENCOUNTER — Other Ambulatory Visit: Payer: Self-pay | Admitting: Unknown Physician Specialty

## 2017-06-13 ENCOUNTER — Other Ambulatory Visit: Payer: Self-pay | Admitting: Unknown Physician Specialty

## 2017-06-15 ENCOUNTER — Encounter: Payer: Self-pay | Admitting: Certified Nurse Midwife

## 2017-06-15 ENCOUNTER — Ambulatory Visit (INDEPENDENT_AMBULATORY_CARE_PROVIDER_SITE_OTHER): Payer: 59 | Admitting: Certified Nurse Midwife

## 2017-06-15 VITALS — BP 100/58 | HR 97 | Ht 64.5 in | Wt 204.0 lb

## 2017-06-15 DIAGNOSIS — Z01419 Encounter for gynecological examination (general) (routine) without abnormal findings: Secondary | ICD-10-CM | POA: Diagnosis not present

## 2017-06-15 DIAGNOSIS — Z124 Encounter for screening for malignant neoplasm of cervix: Secondary | ICD-10-CM

## 2017-06-15 NOTE — Progress Notes (Signed)
Gynecology Annual Exam  PCP: [name], NP  Chief Complaint:  Chief Complaint  Patient presents with  . Gynecologic Exam    History of Present Illness:Leslie Turner presents today for her annual exam. She is a 61 year old Caucasian/White female, G 2 P 2 0 0 2, whose LMP was in the distant past. Her menses are absent and she is postmenopausal. She has had no spotting.  She does have intermittent hot flashes that have been more noticeable over the last 2 years. They are worse after drinking hot coffee in the AM and when outside in the heat. Pt has not taken any medications for reduction. The patient's past medical history is remarkable for anxiety/depression, hypertension, and hypothyroidism.  Since her last annual GYN exam dated 06/22/2015, she has had no significant changes in her health history. Has had problems recently with pain and swelling behind her right knee. She rarely sexually active due to her husband's condition (had a prostatectomy for prostate cancer). She does not have vaginal dryness.  Her most recent pap smear was obtained 06/22/2015 and was with negative cells and negative HPV DNA.  Her last mammogram was done 06/21/2016 and was negative. There is no family history of breast cancer. There is no family history of ovarian cancer. The patient does do monthly self breast exams.  She had a colonoscopy in 4.5 years ago that was normal. Her next colonoscopy is due later this year due to a family history of colon cancer (mother). A DEXA scan was done by her PCP (?date) and was normal. The patient does not smoke.  The patient does drink occasionally.  The patient does not use illegal drugs.  The patient exercises occasionally. Her BMI=34.48 kg/m2..  The patient does get adequate calcium in her diet.  She has her labs managed by her PCP. Her last cholesterol screen in 2018 was normal The patient denies current symptoms of depression.    Review of Systems: Review of  Systems  Constitutional: Negative for chills, fever and weight loss.  HENT: Negative for congestion, sinus pain and sore throat.   Eyes: Negative for blurred vision and pain.  Respiratory: Negative for hemoptysis, shortness of breath and wheezing.   Cardiovascular: Negative for chest pain, palpitations and leg swelling.  Gastrointestinal: Negative for abdominal pain, blood in stool, diarrhea, heartburn, nausea and vomiting.  Genitourinary: Negative for dysuria, frequency, hematuria and urgency.  Musculoskeletal: Positive for joint pain (right kneebbb). Negative for back pain and myalgias.  Skin: Negative for itching and rash.  Neurological: Negative for dizziness, tingling and headaches.  Endo/Heme/Allergies: Negative for environmental allergies and polydipsia. Does not bruise/bleed easily.       Negative for hirsutism   Psychiatric/Behavioral: Negative for depression. The patient is not nervous/anxious and does not have insomnia.     Past Medical History:  Past Medical History:  Diagnosis Date  . Anxiety   . Carotid stenosis   . Depression   . GERD (gastroesophageal reflux disease)   . Hyperlipidemia   . Hypertension   . Metabolic syndrome   . Microscopic hematuria   . Pain in knee   . Thyroid disease    hypothyroid  . Vitamin D deficiency     Past Surgical History:  Past Surgical History:  Procedure Laterality Date  . APPENDECTOMY    . ENDOMETRIAL BIOPSY  06/15/2009   disordered prolif. phase  . FOOT NEUROMA SURGERY Left     Family History:  Family History  Problem Relation Age of Onset  . Diabetes Father   . Heart disease Father   . Hypertension Father   . Heart disease Brother   . Lung cancer Brother   . Arthritis Mother   . Cancer Mother 46       colon  . Heart disease Paternal Grandfather        MI    Social History:  Social History   Social History  . Marital status: Married    Spouse name: Elta Guadeloupe  . Number of children: 2  . Years of education:  N/A   Occupational History  . Not on file.   Social History Main Topics  . Smoking status: Former Research scientist (life sciences)  . Smokeless tobacco: Never Used     Comment: smoked for 12-14 years and quit cold [place]  . Alcohol use No     Comment: occ  . Drug use: No  . Sexual activity: Not Currently    Partners: Male   Other Topics Concern  . Not on file   Social History Narrative  . No narrative on file    Allergies:  No Known Allergies  Medications: Prior to Admission medications   Medication Sig Start Date End Date Taking? Authorizing Provider  atorvastatin (LIPITOR) 40 MG tablet Take 1 tablet (40 mg total) by mouth daily. 08/09/16   [name], NP  cholecalciferol (VITAMIN D) 1000 UNITS tablet Take 1,000 Units by mouth daily.    [provider]  citalopram (CELEXA) 20 MG tablet TAKE 1 TABLET BY MOUTH  DAILY 02/19/17   [name], NP  hydrochlorothiazide (HYDRODIURIL) 25 MG tablet Take 1 tablet (25 mg total) by mouth daily. 08/09/16   [name], NP  levothyroxine (SYNTHROID, LEVOTHROID) 112 MCG tablet Take 1 tablet (112 mcg total) by mouth daily before breakfast. 08/09/16   [name], NP  losartan (COZAAR) 50 MG tablet TAKE 1 TABLET BY MOUTH  DAILY 06/14/17   [name], NP  meloxicam (MOBIC) 7.5 MG tablet Take 1 tablet (7.5 mg total) by mouth daily. 02/07/17   [name], NP  omeprazole (PRILOSEC) 20 MG capsule Take 1 capsule (20 mg total) by mouth daily. 08/09/16   [name], NP    Physical Exam Vitals: BP (!) 100/58   Pulse 97   Ht 5' 4.5" (1.638 m)   Wt 204 lb (92.5 kg)   LMP  (LMP Unknown)   BMI 34.48 kg/m   General: WF in NAD HEENT: normocephalic, anicteric Neck: no thyroid enlargement, no palpable nodules, no cervical lymphadenopathy  Pulmonary: No increased work of breathing, CTAB Cardiovascular: RRR, without murmur  Breast: Breast symmetrical, no tenderness, no palpable nodules or masses, no skin or nipple retraction present, no nipple  discharge.  No axillary, infraclavicular or supraclavicular lymphadenopathy. Abdomen: Soft, non-tender, non-distended.  Umbilicus without lesions.  No hepatomegaly or masses palpable. No evidence of hernia. Genitourinary:  External: Normal external female genitalia.  Normal urethral meatus, normal Bartholin's and Skene's glands.    Vagina: Normal vaginal mucosa, no evidence of prolapse.    Cervix: Grossly normal in appearance, no bleeding, non-tender  Uterus: Anteverted, normal size, shape, and consistency, mobile, and non-tender  Adnexa: No adnexal masses, non-tender  Rectal: deferred  Lymphatic: no evidence of inguinal lymphadenopathy Extremities: no edema, erythema, or tenderness Neurologic: Grossly intact Psychiatric: mood appropriate, affect full     Assessment: 61 y.o. [id number] annual gyn exam  Plan:    1) Breast cancer screening - recommend monthly self breast exam and annual  screening mammograms.  Patient to schedule annual mammogram at [facility]  2) Colon cancer screening-UTD, due in the next 6-12 months  3) Cervical cancer screening - Pap was done. ASCCP guidelines and rational discussed.  Patient opts for every 2 years. screening interval  4) Osteoporosis prevention: discussed calcium and vitamin D3 requirements. To discuss timing of next DEXA with PCP  5) Routine healthcare maintenance including cholesterol and diabetes screening managed by PCP   Dalia Heading, CNM

## 2017-06-19 ENCOUNTER — Other Ambulatory Visit: Payer: Self-pay | Admitting: Unknown Physician Specialty

## 2017-06-19 LAB — IGP, APTIMA HPV
HPV Aptima: NEGATIVE
PAP Smear Comment: 0

## 2017-06-25 ENCOUNTER — Encounter: Payer: Self-pay | Admitting: Unknown Physician Specialty

## 2017-07-11 ENCOUNTER — Other Ambulatory Visit: Payer: Self-pay | Admitting: Unknown Physician Specialty

## 2017-08-08 ENCOUNTER — Other Ambulatory Visit: Payer: Self-pay | Admitting: Unknown Physician Specialty

## 2017-08-14 ENCOUNTER — Ambulatory Visit: Payer: 59 | Admitting: Unknown Physician Specialty

## 2017-08-21 ENCOUNTER — Ambulatory Visit (INDEPENDENT_AMBULATORY_CARE_PROVIDER_SITE_OTHER): Payer: 59 | Admitting: Unknown Physician Specialty

## 2017-08-21 ENCOUNTER — Encounter: Payer: Self-pay | Admitting: Unknown Physician Specialty

## 2017-08-21 VITALS — BP 118/72 | HR 71 | Temp 98.2°F | Wt 204.0 lb

## 2017-08-21 DIAGNOSIS — E039 Hypothyroidism, unspecified: Secondary | ICD-10-CM

## 2017-08-21 DIAGNOSIS — E7800 Pure hypercholesterolemia, unspecified: Secondary | ICD-10-CM

## 2017-08-21 DIAGNOSIS — F329 Major depressive disorder, single episode, unspecified: Secondary | ICD-10-CM

## 2017-08-21 DIAGNOSIS — I10 Essential (primary) hypertension: Secondary | ICD-10-CM | POA: Diagnosis not present

## 2017-08-21 DIAGNOSIS — F32A Depression, unspecified: Secondary | ICD-10-CM

## 2017-08-21 MED ORDER — CITALOPRAM HYDROBROMIDE 20 MG PO TABS: 20.0000 mg | ORAL_TABLET | Freq: Every day | ORAL | 3 refills | Status: DC

## 2017-08-21 MED ORDER — MELOXICAM 7.5 MG PO TABS
7.5000 mg | ORAL_TABLET | Freq: Every day | ORAL | 3 refills | Status: DC
Start: 2017-08-21 — End: 2018-07-05

## 2017-08-21 MED ORDER — LOSARTAN POTASSIUM 50 MG PO TABS: 50.0000 mg | ORAL_TABLET | Freq: Every day | ORAL | 1 refills | Status: DC

## 2017-08-21 MED ORDER — HYDROCHLOROTHIAZIDE 25 MG PO TABS: 25.0000 mg | ORAL_TABLET | Freq: Every day | ORAL | 3 refills | Status: DC

## 2017-08-21 MED ORDER — OMEPRAZOLE 20 MG PO CPDR: 20.0000 mg | DELAYED_RELEASE_CAPSULE | Freq: Every day | ORAL | 3 refills | Status: DC

## 2017-08-21 MED ORDER — LEVOTHYROXINE SODIUM 112 MCG PO TABS: 112.0000 ug | ORAL_TABLET | Freq: Every day | ORAL | 1 refills | Status: DC

## 2017-08-21 MED ORDER — ATORVASTATIN CALCIUM 40 MG PO TABS: 40.0000 mg | ORAL_TABLET | Freq: Every day | ORAL | 1 refills | Status: DC

## 2017-08-21 NOTE — Progress Notes (Signed)
BP 118/72   Pulse 71   Temp 98.2 F (36.8 C)   Wt 204 lb (92.5 kg)   LMP  (LMP Unknown)   SpO2 98%   BMI 34.48 kg/m    Subjective:    Patient ID: Leslie Turner, female    DOB: 23-Nov-1955, 61 y.o.   MRN: [id number]  HPI: Leslie Turner is a 61 y.o. female  Chief Complaint  Patient presents with  . Depression  . Hyperlipidemia  . Hypertension  . Hypothyroidism   Hypertension Using medications without difficulty Average home BPs   No problems or lightheadedness No chest pain with exertion or shortness of breath No Edema  Hyperlipidemia Non-fasting today Using medications without problems: No Muscle aches  Diet compliance:Exercise: Not walking as much as she should.  Eats healthy  Hypothyroid Excellent compliance.  Energy stable.  Weight is the same.    Knee OA Good days and bad days  Depression Depression screen [facility] 2/9 08/21/2017 02/07/2017 08/09/2015  Decreased Interest 0 0 0  Down, Depressed, Hopeless 0 0 0  PHQ - 2 Score 0 0 0  Altered sleeping 0 - -  Tired, decreased energy 0 - -  Change in appetite 0 - -  Feeling bad or failure about yourself  0 - -  Trouble concentrating 0 - -  Moving slowly or fidgety/restless 0 - -  Suicidal thoughts 0 - -  PHQ-9 Score 0 - -   Relevant past medical, surgical, family and social history reviewed and updated as indicated. Interim medical history since our last visit reviewed. Allergies and medications reviewed and updated.  Review of Systems  Per HPI unless specifically indicated above     Objective:    BP 118/72   Pulse 71   Temp 98.2 F (36.8 C)   Wt 204 lb (92.5 kg)   LMP  (LMP Unknown)   SpO2 98%   BMI 34.48 kg/m   Wt Readings from Last 3 Encounters:  08/21/17 204 lb (92.5 kg)  06/15/17 204 lb (92.5 kg)  02/07/17 203 lb 14.4 oz (92.5 kg)    Physical Exam  Constitutional: She is oriented to person, place, and time. She appears well-developed and well-nourished. No distress.  HENT:  Head:  Normocephalic and atraumatic.  Eyes: Conjunctivae and lids are normal. Right eye exhibits no discharge. Left eye exhibits no discharge. No scleral icterus.  Neck: Normal range of motion. Neck supple. No JVD present. Carotid bruit is not present.  Cardiovascular: Normal rate, regular rhythm and normal heart sounds.   Pulmonary/Chest: Effort normal and breath sounds normal.  Abdominal: Normal appearance. There is no splenomegaly or hepatomegaly.  Musculoskeletal: Normal range of motion.  Neurological: She is alert and oriented to person, place, and time.  Skin: Skin is warm, dry and intact. No rash noted. No pallor.  Psychiatric: She has a normal mood and affect. Her behavior is normal. Judgment and thought content normal.    Results for orders placed or performed in visit on 06/15/17  IGP, Aptima HPV  Result Value Ref Range   DIAGNOSIS: Comment    Specimen adequacy: Comment    Clinician Provided ICD10 Comment    Performed by: Comment    PAP Smear Comment .    Note: Comment    Test Methodology Comment    HPV Aptima Negative Negative      Assessment & Plan:   Problem List Items Addressed This Visit      Unprioritized   Chronic depression  Relevant Medications   citalopram (CELEXA) 20 MG tablet   Hyperlipidemia - Primary   Relevant Medications   atorvastatin (LIPITOR) 40 MG tablet   hydrochlorothiazide (HYDRODIURIL) 25 MG tablet   losartan (COZAAR) 50 MG tablet   Other Relevant Orders   Lipid Panel w/o Chol/HDL Ratio   Hypothyroidism   Relevant Medications   levothyroxine (SYNTHROID, LEVOTHROID) 112 MCG tablet   Other Relevant Orders   TSH    Other Visit Diagnoses    Essential hypertension, benign       Relevant Medications   atorvastatin (LIPITOR) 40 MG tablet   hydrochlorothiazide (HYDRODIURIL) 25 MG tablet   losartan (COZAAR) 50 MG tablet   Other Relevant Orders   CBC with Differential/Platelet   Comprehensive metabolic panel       Follow up plan: Return in  about 6 months (around 02/19/2018), or if symptoms worsen or fail to improve.

## 2017-08-22 ENCOUNTER — Encounter: Payer: Self-pay | Admitting: Unknown Physician Specialty

## 2017-08-22 LAB — LIPID PANEL W/O CHOL/HDL RATIO
CHOLESTEROL TOTAL: 159 mg/dL (ref 100–199)
HDL: 47 mg/dL (ref >39)
LDL CALC: 87 mg/dL (ref 0–99)
Triglycerides: 123 mg/dL (ref 0–149)
VLDL CHOLESTEROL CAL: 25 mg/dL (ref 5–40)

## 2017-08-22 LAB — COMPREHENSIVE METABOLIC PANEL
ALBUMIN: 4.1 g/dL (ref 3.6–4.8)
ALK PHOS: 90 IU/L (ref 39–117)
ALT: 16 IU/L (ref 0–32)
AST: 17 IU/L (ref 0–40)
Albumin/Globulin Ratio: 1.6 (ref 1.2–2.2)
BUN / CREAT RATIO: 16 (ref 12–28)
BUN: 14 mg/dL (ref 8–27)
Bilirubin Total: 0.6 mg/dL (ref 0.0–1.2)
CO2: 23 mmol/L (ref 20–29)
CREATININE: 0.9 mg/dL (ref 0.57–1.00)
Calcium: 9.1 mg/dL (ref 8.7–10.3)
Chloride: 99 mmol/L (ref 96–106)
GFR calc non Af Amer: 70 mL/min/{1.73_m2} (ref >59)
GFR, EST AFRICAN AMERICAN: 80 mL/min/{1.73_m2} (ref >59)
GLOBULIN, TOTAL: 2.5 g/dL (ref 1.5–4.5)
Glucose: 113 mg/dL — ABNORMAL HIGH (ref 65–99)
Potassium: 3.5 mmol/L (ref 3.5–5.2)
SODIUM: 139 mmol/L (ref 134–144)
Total Protein: 6.6 g/dL (ref 6.0–8.5)

## 2017-08-22 LAB — CBC WITH DIFFERENTIAL/PLATELET
Basophils Absolute: 0 10*3/uL (ref 0.0–0.2)
Basos: 1 %
EOS (ABSOLUTE): 0.5 10*3/uL — ABNORMAL HIGH (ref 0.0–0.4)
EOS: 7 %
HEMATOCRIT: 39.8 % (ref 34.0–46.6)
HEMOGLOBIN: 13.2 g/dL (ref 11.1–15.9)
IMMATURE GRANS (ABS): 0 10*3/uL (ref 0.0–0.1)
Immature Granulocytes: 0 %
LYMPHS ABS: 3 10*3/uL (ref 0.7–3.1)
LYMPHS: 45 %
MCH: 29.5 pg (ref 26.6–33.0)
MCHC: 33.2 g/dL (ref 31.5–35.7)
MCV: 89 fL (ref 79–97)
MONOCYTES: 8 %
Monocytes Absolute: 0.5 10*3/uL (ref 0.1–0.9)
NEUTROS ABS: 2.6 10*3/uL (ref 1.4–7.0)
Neutrophils: 39 %
Platelets: 269 10*3/uL (ref 150–379)
RBC: 4.47 x10E6/uL (ref 3.77–5.28)
RDW: 13.6 % (ref 12.3–15.4)
WBC: 6.7 10*3/uL (ref 3.4–10.8)

## 2017-08-22 LAB — TSH: TSH: 1.1 u[IU]/mL (ref 0.450–4.500)

## 2018-01-17 ENCOUNTER — Other Ambulatory Visit: Payer: Self-pay | Admitting: Unknown Physician Specialty

## 2018-02-19 ENCOUNTER — Ambulatory Visit (INDEPENDENT_AMBULATORY_CARE_PROVIDER_SITE_OTHER): Payer: 59 | Admitting: Unknown Physician Specialty

## 2018-02-19 ENCOUNTER — Encounter: Payer: Self-pay | Admitting: Unknown Physician Specialty

## 2018-02-19 VITALS — BP 117/74 | HR 69 | Temp 98.6°F | Ht 64.5 in | Wt 203.5 lb

## 2018-02-19 DIAGNOSIS — I10 Essential (primary) hypertension: Secondary | ICD-10-CM | POA: Insufficient documentation

## 2018-02-19 DIAGNOSIS — F32A Depression, unspecified: Secondary | ICD-10-CM

## 2018-02-19 DIAGNOSIS — M79606 Pain in leg, unspecified: Secondary | ICD-10-CM | POA: Diagnosis not present

## 2018-02-19 DIAGNOSIS — F329 Major depressive disorder, single episode, unspecified: Secondary | ICD-10-CM | POA: Diagnosis not present

## 2018-02-19 DIAGNOSIS — G2581 Restless legs syndrome: Secondary | ICD-10-CM | POA: Diagnosis not present

## 2018-02-19 DIAGNOSIS — E785 Hyperlipidemia, unspecified: Secondary | ICD-10-CM | POA: Diagnosis not present

## 2018-02-19 MED ORDER — ROPINIROLE HCL 0.5 MG PO TABS: 0.5000 mg | ORAL_TABLET | Freq: Every day | ORAL | 1 refills | Status: DC

## 2018-02-19 MED ORDER — ROPINIROLE HCL 0.5 MG PO TABS: 0.5000 mg | ORAL_TABLET | Freq: Three times a day (TID) | ORAL | 0 refills | Status: DC

## 2018-02-19 MED ORDER — CYANOCOBALAMIN 1000 MCG/ML IJ SOLN: 1000.0000 ug | Freq: Once | INTRAMUSCULAR | Status: DC

## 2018-02-19 NOTE — Assessment & Plan Note (Signed)
Continue Lipitor for now.  Due to legs aching, may consider a trial off of medications

## 2018-02-19 NOTE — Progress Notes (Signed)
BP 117/74   Pulse 69   Temp 98.6 F (37 C) (Oral)   Ht 5' 4.5" (1.638 m)   Wt 203 lb 8 oz (92.3 kg)   LMP  (LMP Unknown)   SpO2 96%   BMI 34.39 kg/m    Subjective:    Patient ID: Leslie Turner, female    DOB: [birth year], 62 y.o.   MRN: [id number]  HPI: Leslie Turner is a 62 y.o. female  Chief Complaint  Patient presents with  . Depression  . Hyperlipidemia  . Hypertension   Hypertension Using medications without difficulty Average home BPs Not checking   No problems or lightheadedness No chest pain with exertion or shortness of breath No Edema  Hyperlipidemia Using medications without problems: No Muscle aches  Diet compliance:Exercise:  Depression Depression screen [facility] 2/9 02/19/2018 08/21/2017 02/07/2017 08/09/2015  Decreased Interest 0 0 0 0  Down, Depressed, Hopeless 0 0 0 0  PHQ - 2 Score 0 0 0 0  Altered sleeping 1 0 - -  Tired, decreased energy 1 0 - -  Change in appetite 0 0 - -  Feeling bad or failure about yourself  0 0 - -  Trouble concentrating 0 0 - -  Moving slowly or fidgety/restless 0 0 - -  Suicidal thoughts 0 0 - -  PHQ-9 Score 2 0 - -   Leg pain Pt states sometimes her lower legs ache, especially when she is up on them for a while.  Unable to stand still for long.  Legs ache at night. Better when walking.  Both knees ache, but feels she can handle that.    Relevant past medical, surgical, family and social history reviewed and updated as indicated. Interim medical history since our last visit reviewed. Allergies and medications reviewed and updated.  Review of Systems  Per HPI unless specifically indicated above     Objective:    BP 117/74   Pulse 69   Temp 98.6 F (37 C) (Oral)   Ht 5' 4.5" (1.638 m)   Wt 203 lb 8 oz (92.3 kg)   LMP  (LMP Unknown)   SpO2 96%   BMI 34.39 kg/m   Wt Readings from Last 3 Encounters:  02/19/18 203 lb 8 oz (92.3 kg)  08/21/17 204 lb (92.5 kg)  06/15/17 204 lb (92.5 kg)    Physical Exam    Constitutional: She is oriented to person, place, and time. She appears well-developed and well-nourished. No distress.  HENT:  Head: Normocephalic and atraumatic.  Eyes: Conjunctivae and lids are normal. Right eye exhibits no discharge. Left eye exhibits no discharge. No scleral icterus.  Neck: Normal range of motion. Neck supple. No JVD present. Carotid bruit is not present.  Cardiovascular: Normal rate, regular rhythm and normal heart sounds.  Pulmonary/Chest: Effort normal and breath sounds normal.  Abdominal: Normal appearance. There is no splenomegaly or hepatomegaly.  Musculoskeletal: Normal range of motion.  Neurological: She is alert and oriented to person, place, and time.  Skin: Skin is warm, dry and intact. No rash noted. No pallor.  Psychiatric: She has a normal mood and affect. Her behavior is normal. Judgment and thought content normal.    Results for orders placed or performed in visit on 08/21/17  CBC with Differential/Platelet  Result Value Ref Range   WBC 6.7 3.4 - 10.8 x10E3/uL   RBC 4.47 3.77 - 5.28 x10E6/uL   Hemoglobin 13.2 11.1 - 15.9 g/dL   Hematocrit 39.8 34.0 -  46.6 %   MCV 89 79 - 97 fL   MCH 29.5 26.6 - 33.0 pg   MCHC 33.2 31.5 - 35.7 g/dL   RDW 13.6 12.3 - 15.4 %   Platelets 269 150 - 379 x10E3/uL   Neutrophils 39 Not Estab. %   Lymphs 45 Not Estab. %   Monocytes 8 Not Estab. %   Eos 7 Not Estab. %   Basos 1 Not Estab. %   Neutrophils Absolute 2.6 1.4 - 7.0 x10E3/uL   Lymphocytes Absolute 3.0 0.7 - 3.1 x10E3/uL   Monocytes Absolute 0.5 0.1 - 0.9 x10E3/uL   EOS (ABSOLUTE) 0.5 (H) 0.0 - 0.4 x10E3/uL   Basophils Absolute 0.0 0.0 - 0.2 x10E3/uL   Immature Granulocytes 0 Not Estab. %   Immature Grans (Abs) 0.0 0.0 - 0.1 x10E3/uL  Comprehensive metabolic panel  Result Value Ref Range   Glucose 113 (H) 65 - 99 mg/dL   BUN 14 8 - 27 mg/dL   Creatinine, Ser 0.90 0.57 - 1.00 mg/dL   GFR calc non Af Amer 70 >59 mL/min/1.73   GFR calc Af Amer 80 >59  mL/min/1.73   BUN/Creatinine Ratio 16 12 - 28   Sodium 139 134 - 144 mmol/L   Potassium 3.5 3.5 - 5.2 mmol/L   Chloride 99 96 - 106 mmol/L   CO2 23 20 - 29 mmol/L   Calcium 9.1 8.7 - 10.3 mg/dL   Total Protein 6.6 6.0 - 8.5 g/dL   Albumin 4.1 3.6 - 4.8 g/dL   Globulin, Total 2.5 1.5 - 4.5 g/dL   Albumin/Globulin Ratio 1.6 1.2 - 2.2   Bilirubin Total 0.6 0.0 - 1.2 mg/dL   Alkaline Phosphatase 90 39 - 117 IU/L   AST 17 0 - 40 IU/L   ALT 16 0 - 32 IU/L  Lipid Panel w/o Chol/HDL Ratio  Result Value Ref Range   Cholesterol, Total 159 100 - 199 mg/dL   Triglycerides 123 0 - 149 mg/dL   HDL 47 >39 mg/dL   VLDL Cholesterol Cal 25 5 - 40 mg/dL   LDL Calculated 87 0 - 99 mg/dL  TSH  Result Value Ref Range   TSH 1.100 0.450 - 4.500 uIU/mL      Assessment & Plan:   Problem List Items Addressed This Visit      Unprioritized   Aching leg syndrome    ? Restless legs vs statin vs non-conditioning.  Check laps.  Check Vit D and B12      Relevant Orders   Vitamin B12   Chronic depression    Stable, continue present medications.        Essential hypertension, benign    Stable, continue present medications.        Relevant Orders   Comprehensive metabolic panel   Hyperlipidemia - Primary    Continue Lipitor for now.  Due to legs aching, may consider a trial off of medications      Restless legs    Check Ferritin.  Rx for Ropinarole 1/2 mg and may increase to 1 mg       Relevant Medications   cyanocobalamin ((VITAMIN B-12)) injection 1,000 mcg   Other Relevant Orders   Comprehensive metabolic panel   Ferritin   VITAMIN D 25 Hydroxy (Vit-D Deficiency, Fractures)       Follow up plan: Return in about 3 weeks (around 03/12/2018).

## 2018-02-19 NOTE — Assessment & Plan Note (Signed)
Check Ferritin.  Rx for Ropinarole 1/2 mg and may increase to 1 mg

## 2018-02-19 NOTE — Assessment & Plan Note (Addendum)
?   Restless legs vs statin vs non-conditioning.  Check laps.  Check Vit D and B12

## 2018-02-19 NOTE — Assessment & Plan Note (Signed)
Stable, continue present medications.   

## 2018-02-20 LAB — COMPREHENSIVE METABOLIC PANEL
A/G RATIO: 1.7 (ref 1.2–2.2)
ALBUMIN: 4.3 g/dL (ref 3.6–4.8)
ALK PHOS: 89 IU/L (ref 39–117)
ALT: 15 IU/L (ref 0–32)
AST: 19 IU/L (ref 0–40)
BILIRUBIN TOTAL: 0.6 mg/dL (ref 0.0–1.2)
BUN / CREAT RATIO: 21 (ref 12–28)
BUN: 18 mg/dL (ref 8–27)
CHLORIDE: 101 mmol/L (ref 96–106)
CO2: 25 mmol/L (ref 20–29)
CREATININE: 0.84 mg/dL (ref 0.57–1.00)
Calcium: 9.4 mg/dL (ref 8.7–10.3)
GFR calc Af Amer: 87 mL/min/{1.73_m2} (ref >59)
GFR calc non Af Amer: 75 mL/min/{1.73_m2} (ref >59)
GLOBULIN, TOTAL: 2.6 g/dL (ref 1.5–4.5)
Glucose: 84 mg/dL (ref 65–99)
POTASSIUM: 3.9 mmol/L (ref 3.5–5.2)
SODIUM: 142 mmol/L (ref 134–144)
Total Protein: 6.9 g/dL (ref 6.0–8.5)

## 2018-02-20 LAB — FERRITIN: FERRITIN: 74 ng/mL (ref 15–150)

## 2018-02-20 LAB — VITAMIN B12: Vitamin B-12: 599 pg/mL (ref 232–1245)

## 2018-02-20 LAB — VITAMIN D 25 HYDROXY (VIT D DEFICIENCY, FRACTURES): Vit D, 25-Hydroxy: 33.6 ng/mL (ref 30.0–100.0)

## 2018-02-22 ENCOUNTER — Encounter: Payer: Self-pay | Admitting: Unknown Physician Specialty

## 2018-03-10 ENCOUNTER — Other Ambulatory Visit: Payer: Self-pay | Admitting: Unknown Physician Specialty

## 2018-03-13 ENCOUNTER — Encounter: Payer: Self-pay | Admitting: Unknown Physician Specialty

## 2018-03-13 ENCOUNTER — Ambulatory Visit (INDEPENDENT_AMBULATORY_CARE_PROVIDER_SITE_OTHER): Payer: 59 | Admitting: Unknown Physician Specialty

## 2018-03-13 DIAGNOSIS — M79606 Pain in leg, unspecified: Secondary | ICD-10-CM | POA: Diagnosis not present

## 2018-03-13 DIAGNOSIS — G2581 Restless legs syndrome: Secondary | ICD-10-CM | POA: Diagnosis not present

## 2018-03-13 NOTE — Assessment & Plan Note (Signed)
Doing well with current medications.

## 2018-03-13 NOTE — Assessment & Plan Note (Signed)
Probably related to knee pain.  Pt would like at this time not to follow up with Orthopedics.

## 2018-03-13 NOTE — Progress Notes (Signed)
BP 107/76   Pulse 61   Wt 202 lb (91.6 kg)   LMP  (LMP Unknown)   SpO2 96%   BMI 34.14 kg/m    Subjective:    Patient ID: Leslie Turner, female    DOB: 1956-01-21, 62 y.o.   MRN: [id number]  HPI: Leslie Turner is a 62 y.o. female  Chief Complaint  Patient presents with  . Follow-up    Restless Leg Syndrome. Started on Requip x 3 weeks. No Side effect. Has noticed benefit.    Restless legs Pt finding that .5mg  of Ropinarole is working well for her restless legs.  She takes it BID.  No need to increase at this time.    Aching legs F/u for this complaint from previous.  She feels it's related to knee pain.  Has already had injections for this.    Relevant past medical, surgical, family and social history reviewed and updated as indicated. Interim medical history since our last visit reviewed. Allergies and medications reviewed and updated.  Review of Systems  Per HPI unless specifically indicated above     Objective:    BP 107/76   Pulse 61   Wt 202 lb (91.6 kg)   LMP  (LMP Unknown)   SpO2 96%   BMI 34.14 kg/m   Wt Readings from Last 3 Encounters:  03/13/18 202 lb (91.6 kg)  02/19/18 203 lb 8 oz (92.3 kg)  08/21/17 204 lb (92.5 kg)    Physical Exam  Constitutional: She is oriented to person, place, and time. She appears well-developed and well-nourished. No distress.  HENT:  Head: Normocephalic and atraumatic.  Eyes: Conjunctivae and lids are normal. Right eye exhibits no discharge. Left eye exhibits no discharge. No scleral icterus.  Cardiovascular: Normal rate.  Pulmonary/Chest: Effort normal.  Abdominal: Normal appearance. There is no splenomegaly or hepatomegaly.  Musculoskeletal: Normal range of motion.  Neurological: She is alert and oriented to person, place, and time.  Skin: Skin is intact. No rash noted. No pallor.  Psychiatric: She has a normal mood and affect. Her behavior is normal. Judgment and thought content normal.    Results for  orders placed or performed in visit on 02/19/18  Comprehensive metabolic panel  Result Value Ref Range   Glucose 84 65 - 99 mg/dL   BUN 18 8 - 27 mg/dL   Creatinine, Ser 0.84 0.57 - 1.00 mg/dL   GFR calc non Af Amer 75 >59 mL/min/1.73   GFR calc Af Amer 87 >59 mL/min/1.73   BUN/Creatinine Ratio 21 12 - 28   Sodium 142 134 - 144 mmol/L   Potassium 3.9 3.5 - 5.2 mmol/L   Chloride 101 96 - 106 mmol/L   CO2 25 20 - 29 mmol/L   Calcium 9.4 8.7 - 10.3 mg/dL   Total Protein 6.9 6.0 - 8.5 g/dL   Albumin 4.3 3.6 - 4.8 g/dL   Globulin, Total 2.6 1.5 - 4.5 g/dL   Albumin/Globulin Ratio 1.7 1.2 - 2.2   Bilirubin Total 0.6 0.0 - 1.2 mg/dL   Alkaline Phosphatase 89 39 - 117 IU/L   AST 19 0 - 40 IU/L   ALT 15 0 - 32 IU/L  Ferritin  Result Value Ref Range   Ferritin 74 15 - 150 ng/mL  VITAMIN D 25 Hydroxy (Vit-D Deficiency, Fractures)  Result Value Ref Range   Vit D, 25-Hydroxy 33.6 30.0 - 100.0 ng/mL  Vitamin B12  Result Value Ref Range   Vitamin B-12 599  232 - 1,245 pg/mL      Assessment & Plan:   Problem List Items Addressed This Visit      Unprioritized   Aching leg syndrome    Probably related to knee pain.  Pt would like at this time not to follow up with Orthopedics.        Restless legs    Doing well with current medications.            Follow up plan: Return in about 6 months (around 09/13/2018), or if symptoms worsen or fail to improve.

## 2018-05-03 ENCOUNTER — Other Ambulatory Visit: Payer: Self-pay | Admitting: Unknown Physician Specialty

## 2018-05-03 MED ORDER — ROPINIROLE HCL 0.5 MG PO TABS: 0.5000 mg | ORAL_TABLET | Freq: Two times a day (BID) | ORAL | 0 refills | Status: DC

## 2018-05-03 MED ORDER — ROPINIROLE HCL 0.5 MG PO TABS: 0.5000 mg | ORAL_TABLET | Freq: Two times a day (BID) | ORAL | 3 refills | Status: DC

## 2018-05-03 NOTE — Telephone Encounter (Signed)
Copied from [place] [id number]. Topic: Quick Communication - See Telephone Encounter >> May 03, 2018 12:22 PM Hewitt Shorts wrote: Pt is calling to ask if while the rx for ropinirole is being refilled and sent to mail order pharmacy [facility]  that she also approve one refill to go to [facility] [address]   Best number is [phone]

## 2018-05-03 NOTE — Telephone Encounter (Signed)
Requesting Ropinirole refill; last refill 02/19/18; #30; no refills  Last office visit 03/13/18  PCP: [name]  Requesting that she have refill sent to mail order pharmacy [facility]) and [facility] [address] [place]  Due to discrepancy on dose, sending back to office for review.  Medication record shows Rx on 02/19/18 to be TID.  Per last note of 03/13/18 of [name]:    "Restless legs Pt finding that .5mg  of Ropinarole is working well for her restless legs.  She takes it BID.  No need to increase at this time."

## 2018-05-03 NOTE — Telephone Encounter (Signed)
Patient needs updated Rx sent to pharmacy per visit 03/13/18- patient using 0.5mg  bid. Rx on medication list is not current with note.

## 2018-06-02 ENCOUNTER — Other Ambulatory Visit: Payer: Self-pay | Admitting: Unknown Physician Specialty

## 2018-06-23 ENCOUNTER — Other Ambulatory Visit: Payer: Self-pay | Admitting: Unknown Physician Specialty

## 2018-07-04 ENCOUNTER — Other Ambulatory Visit: Payer: Self-pay | Admitting: Unknown Physician Specialty

## 2018-07-04 DIAGNOSIS — I10 Essential (primary) hypertension: Secondary | ICD-10-CM

## 2018-07-05 ENCOUNTER — Other Ambulatory Visit: Payer: Self-pay | Admitting: Unknown Physician Specialty

## 2018-07-05 DIAGNOSIS — G8929 Other chronic pain: Secondary | ICD-10-CM

## 2018-07-05 DIAGNOSIS — M25569 Pain in unspecified knee: Principal | ICD-10-CM

## 2018-07-05 NOTE — Telephone Encounter (Signed)
Mobic 7.5 mg refill Last Refill:08/21/17 # 90 Last OV: 03/13/18 with [name] PCP: Ned Card - There is an appt scheduled with her for 09/17/18 at 3:30 but the pt has not been seen by her yet. Pharmacy:[facility] [place], [place] because was not sure which name to refill under.  Thanks.

## 2018-07-05 NOTE — Telephone Encounter (Signed)
Hydrochlorothiazide 25 mg refill Last Refill:08/21/17 # 90 with 3 refills Last OV: 03/13/18 PCP: formerly Leslie Turner   NOV  11/19 with Leslie Guarneri, NP Pharmacy:[facility]  Refill too soon

## 2018-07-29 ENCOUNTER — Other Ambulatory Visit: Payer: Self-pay | Admitting: Unknown Physician Specialty

## 2018-07-30 NOTE — Telephone Encounter (Signed)
Requested Prescriptions  Pending Prescriptions Disp Refills  . levothyroxine (SYNTHROID, LEVOTHROID) 112 MCG tablet [Pharmacy Med Name: LEVOTHYROXINE  0.112MG   TAB] 90 tablet 0    Sig: TAKE 1 TABLET BY MOUTH  DAILY BEFORE BREAKFAST     Endocrinology:  Hypothyroid Agents Failed - 07/29/2018  9:24 PM      Failed - TSH needs to be rechecked within 3 months after an abnormal result. Refill until TSH is due.      Passed - TSH in normal range and within 360 days    TSH  Date Value Ref Range Status  08/21/2017 1.100 0.450 - 4.500 uIU/mL Final         Passed - Valid encounter within last 12 months    Recent Outpatient Visits          4 months ago Aching leg syndrome, unspecified laterality   [place], NP   5 months ago Hyperlipidemia, unspecified hyperlipidemia type   [facility] [name], NP   11 months ago Pure hypercholesterolemia   [place], NP   1 year ago Hypertensive CKD (chronic kidney disease)   [facility] [name], NP   1 year ago Cellulitis of chest wall   [facility], Lilia Argue, [place]      Future Appointments            In 1 month [place], Barbaraann Faster, NP [employer], [facility]

## 2018-08-09 ENCOUNTER — Ambulatory Visit (INDEPENDENT_AMBULATORY_CARE_PROVIDER_SITE_OTHER): Payer: 59 | Admitting: Nurse Practitioner

## 2018-08-09 ENCOUNTER — Encounter: Payer: Self-pay | Admitting: Nurse Practitioner

## 2018-08-09 VITALS — BP 127/78 | HR 70 | Ht 64.5 in | Wt 202.0 lb

## 2018-08-09 DIAGNOSIS — M545 Low back pain, unspecified: Secondary | ICD-10-CM | POA: Insufficient documentation

## 2018-08-09 DIAGNOSIS — R109 Unspecified abdominal pain: Secondary | ICD-10-CM | POA: Diagnosis not present

## 2018-08-09 LAB — UA/M W/RFLX CULTURE, ROUTINE
Bilirubin, UA: NEGATIVE
Glucose, UA: NEGATIVE
Ketones, UA: NEGATIVE
Leukocytes, UA: NEGATIVE
NITRITE UA: NEGATIVE
RBC, UA: NEGATIVE
Specific Gravity, UA: 1.015 (ref 1.005–1.030)
UUROB: 1 mg/dL (ref 0.2–1.0)
pH, UA: 7.5 (ref 5.0–7.5)

## 2018-08-09 MED ORDER — CYCLOBENZAPRINE HCL 5 MG PO TABS: 5.0000 mg | ORAL_TABLET | Freq: Three times a day (TID) | ORAL | 0 refills | Status: DC | PRN

## 2018-08-09 NOTE — Progress Notes (Signed)
BP 127/78 (BP Location: Left Arm, Patient Position: Sitting)   LMP  (LMP Unknown)    Subjective:    Patient ID: Leslie Turner, female    DOB: [birth year], 62 y.o.   MRN: [id number]  HPI: Leslie Turner is a 62 y.o. female presents for mid-left back pain.  Chief Complaint  Patient presents with  . Flank Pain    Patient states she has low back pain near kidneys.   . Back Pain    BACK PAIN Duration: Almost one week to left lower back Mechanism of injury: unknown Location: Left Onset: sudden Severity: 1/10 and at times a 2/10 Quality: dull and throbbing Frequency: intermittent Radiation: none Aggravating factors: none Alleviating factors: ice and NSAIDs Status: better Treatments attempted: ice and ibuprofen  Relief with NSAIDs?: mild Nighttime pain:  no Paresthesias / decreased sensation:  no Bowel / bladder incontinence:  no Fevers:  no Dysuria / urinary frequency:  no  She does not recall any incident that started back pain.  Reports pain present to mid-left back.  States it is improving.  Wanted to ensure it was not UTI related.  Reports she did wake up with a muscle spasm in back last night, but overall pain is improving.  Denies any urinary symptoms.  Denies chills, headache, fatigue, or radiation of pain with numbness.    Relevant past medical, surgical, family and social history reviewed and updated as indicated. Interim medical history since our last visit reviewed. Allergies and medications reviewed and updated.  Review of Systems  Constitutional: Negative for activity change, appetite change, fatigue and fever.  Respiratory: Negative for cough, chest tightness and shortness of breath.   Cardiovascular: Negative for chest pain, palpitations and leg swelling.  Gastrointestinal: Negative for abdominal distention, abdominal pain, constipation, diarrhea, nausea and vomiting.  Genitourinary: Negative for difficulty urinating, frequency, hematuria and urgency.    Musculoskeletal: Positive for back pain. Negative for gait problem, neck pain and neck stiffness.  Neurological: Negative for dizziness, numbness and headaches.    Per HPI unless specifically indicated above     Objective:    BP 127/78 (BP Location: Left Arm, Patient Position: Sitting)   LMP  (LMP Unknown)   Wt Readings from Last 3 Encounters:  03/13/18 202 lb (91.6 kg)  02/19/18 203 lb 8 oz (92.3 kg)  08/21/17 204 lb (92.5 kg)    Physical Exam  Constitutional: She is oriented to person, place, and time. She appears well-developed and well-nourished.  HENT:  Head: Normocephalic and atraumatic.  Eyes: Pupils are equal, round, and reactive to light.  Neck: Normal range of motion.  Cardiovascular: Normal rate, regular rhythm and normal heart sounds.  Pulmonary/Chest: Effort normal and breath sounds normal.  Abdominal: Soft. Bowel sounds are normal.  Genitourinary:  Genitourinary Comments: No CVA or suprapubic tenderness  Musculoskeletal: Normal range of motion.  Able to perform all back ROM without grimacing or report of pain.  No tenderness on palpation.  No bruising or rash noted.  Neurological: She is alert and oriented to person, place, and time.  Skin: Skin is warm and dry.  Psychiatric: She has a normal mood and affect. Her behavior is normal.    Results for orders placed or performed in visit on 02/19/18  Comprehensive metabolic panel  Result Value Ref Range   Glucose 84 65 - 99 mg/dL   BUN 18 8 - 27 mg/dL   Creatinine, Ser 0.84 0.57 - 1.00 mg/dL   GFR calc non Af Wyvonnia Lora  75 >59 mL/min/1.73   GFR calc Af Amer 87 >59 mL/min/1.73   BUN/Creatinine Ratio 21 12 - 28   Sodium 142 134 - 144 mmol/L   Potassium 3.9 3.5 - 5.2 mmol/L   Chloride 101 96 - 106 mmol/L   CO2 25 20 - 29 mmol/L   Calcium 9.4 8.7 - 10.3 mg/dL   Total Protein 6.9 6.0 - 8.5 g/dL   Albumin 4.3 3.6 - 4.8 g/dL   Globulin, Total 2.6 1.5 - 4.5 g/dL   Albumin/Globulin Ratio 1.7 1.2 - 2.2   Bilirubin Total  0.6 0.0 - 1.2 mg/dL   Alkaline Phosphatase 89 39 - 117 IU/L   AST 19 0 - 40 IU/L   ALT 15 0 - 32 IU/L  Ferritin  Result Value Ref Range   Ferritin 74 15 - 150 ng/mL  VITAMIN D 25 Hydroxy (Vit-D Deficiency, Fractures)  Result Value Ref Range   Vit D, 25-Hydroxy 33.6 30.0 - 100.0 ng/mL  Vitamin B12  Result Value Ref Range   Vitamin B-12 599 232 - 1,245 pg/mL      Assessment & Plan:   Problem List Items Addressed This Visit      Other   Acute left-sided low back pain without sciatica    Acute, improving.  UA negative for infection (neg NIT, blood, leuks).  Pain to left mid-back improving with intermittent muscle spasms at night reported.  Script for Flexeril 5MG  TID PRN x 15 tablet.  Educated patient on medication and use.  Informed patient to return if worsening or no improvement in symptoms.      Relevant Medications   cyclobenzaprine (FLEXERIL) 5 MG tablet    Other Visit Diagnoses    Flank pain    -  Primary   Negative for UTI.  Pain present more left mid-back.   Relevant Orders   UA/M w/rflx Culture, Routine      Controlled substance check.  Checked data base and no other refills of controlled substances noted.  Follow up plan: Return if symptoms worsen or fail to improve.

## 2018-08-09 NOTE — Patient Instructions (Signed)

## 2018-08-09 NOTE — Assessment & Plan Note (Signed)
Acute, improving.  UA negative for infection (neg NIT, blood, leuks).  Pain to left mid-back improving with intermittent muscle spasms at night reported.  Script for Flexeril 5MG  TID PRN x 15 tablet.  Educated patient on medication and use.  Informed patient to return if worsening or no improvement in symptoms.

## 2018-08-29 ENCOUNTER — Other Ambulatory Visit: Payer: Self-pay | Admitting: Physician Assistant

## 2018-08-29 DIAGNOSIS — I10 Essential (primary) hypertension: Secondary | ICD-10-CM

## 2018-08-30 NOTE — Telephone Encounter (Signed)
Refill request approved

## 2018-09-11 ENCOUNTER — Other Ambulatory Visit: Payer: Self-pay | Admitting: Unknown Physician Specialty

## 2018-09-15 ENCOUNTER — Other Ambulatory Visit: Payer: Self-pay | Admitting: Unknown Physician Specialty

## 2018-09-17 ENCOUNTER — Ambulatory Visit: Payer: 59 | Admitting: Unknown Physician Specialty

## 2018-09-17 ENCOUNTER — Encounter: Payer: Self-pay | Admitting: Nurse Practitioner

## 2018-09-17 ENCOUNTER — Ambulatory Visit (INDEPENDENT_AMBULATORY_CARE_PROVIDER_SITE_OTHER): Payer: 59 | Admitting: Nurse Practitioner

## 2018-09-17 VITALS — BP 117/73 | HR 54 | Temp 98.3°F | Ht 64.5 in | Wt 199.5 lb

## 2018-09-17 DIAGNOSIS — E7800 Pure hypercholesterolemia, unspecified: Secondary | ICD-10-CM

## 2018-09-17 DIAGNOSIS — E039 Hypothyroidism, unspecified: Secondary | ICD-10-CM

## 2018-09-17 DIAGNOSIS — I10 Essential (primary) hypertension: Secondary | ICD-10-CM

## 2018-09-17 MED ORDER — ZOSTER VAC RECOMB ADJUVANTED 50 MCG/0.5ML IM SUSR: 0.5000 mL | Freq: Once | INTRAMUSCULAR | 0 refills | Status: AC

## 2018-09-17 MED ORDER — ATORVASTATIN CALCIUM 40 MG PO TABS: 40.0000 mg | ORAL_TABLET | Freq: Every day | ORAL | 3 refills | Status: DC

## 2018-09-17 MED ORDER — LEVOTHYROXINE SODIUM 112 MCG PO TABS: ORAL_TABLET | ORAL | 3 refills | Status: DC

## 2018-09-17 MED ORDER — HYDROCHLOROTHIAZIDE 25 MG PO TABS: 25.0000 mg | ORAL_TABLET | Freq: Every day | ORAL | 3 refills | Status: DC

## 2018-09-17 MED ORDER — LOSARTAN POTASSIUM 50 MG PO TABS: 50.0000 mg | ORAL_TABLET | Freq: Every day | ORAL | 3 refills | Status: DC

## 2018-09-17 MED ORDER — CITALOPRAM HYDROBROMIDE 20 MG PO TABS: 20.0000 mg | ORAL_TABLET | Freq: Every day | ORAL | 3 refills | Status: DC

## 2018-09-17 NOTE — Assessment & Plan Note (Addendum)
Chronic, well-controlled on current regimen.  Urine micro ALB 10 and CRT 10 with A:C <30.  CMP today.  Continue current regimen.

## 2018-09-17 NOTE — Patient Instructions (Signed)
Live Zoster (Shingles) Vaccine, ZVL: What You Need to Know  1. What is shingles?  Shingles (also called herpes zoster, or just zoster) is a painful skin rash, often with blisters. Shingles is caused by the varicella zoster virus, the same virus that causes chickenpox. After you have chickenpox, the virus stays in your body and can cause shingles later in life.  You can't catch shingles from another person. However, a person who has never had chickenpox (or chickenpox vaccine) could get chickenpox from someone with shingles.  A shingles rash usually appears on one side of the face or body and heals within 2 to 4 weeks. Its main symptom is pain, which can be severe. Other symptoms can include fever, headache, chills, and upset stomach. Very rarely, a shingles infection can lead to pneumonia, hearing problems, blindness, brain inflammation (encephalitis), or death.  For about 1 person in 5, severe pain can continue even long after the rash has cleared up. This long-lasting pain is called post-herpetic neuralgia (PHN).  Shingles is far more common in people 50 years of age and older than in younger people, and the risk increases with age. It is also more common in people whose immune system is weakened because of a disease such as cancer or by drugs such as steroids or chemotherapy.  At least 1 million people a year in the [place] get shingles.  2. Shingles vaccine (live)  A live shingles vaccine was approved by FDA in 2006. In a clinical trial, the vaccine reduced the risk of shingles by about 50% in people 60 and older. It can reduce the likelihood of PHN, and reduce pain in some people who still get shingles after being vaccinated.  The recommended schedule for live shingles vaccine is a single dose for adults 60 years of age and older.  3. Some people should not get this vaccine  Tell your vaccine provider if you:  · Have any severe, life-threatening allergies. A person who has ever had a life-threatening  allergic reaction after a dose of live shingles vaccine, or has a severe allergy to any component of this vaccine, may be advised not to be vaccinated. Ask your health care provider if you want information about vaccine components.  · Are pregnant, or think you might be pregnant. Pregnant women should wait to get live shingles vaccine until they are no longer pregnant. Women should avoid getting pregnant for at least 1 month after getting shingles vaccine.  · Have a weakened immune system due to disease (such as cancer or AIDS) or medical treatments (such as radiation, immunotherapy, high-dose steroids, or chemotherapy).  · Are not feeling well. If you have a mild illness, such as a cold, you can probably get the vaccine today. If you are moderately or severely ill, you should probably wait until you recover. Your doctor can advise you.    4. Risks of a vaccine reaction  With any medicine, including vaccines, there is a chance of reactions.  After live shingles vaccination, a person might experience:  · Redness, soreness, swelling, or itching at the site of the injection  · Headache    These events are usually mild and go away on their own.  Rarely, live shingles vaccine can cause rash or shingles.  Other things that could happen after this vaccine:  · People sometimes faint after medical procedures, including vaccination. Sitting or lying down for about 15 minutes can help prevent fainting and injuries caused by a fall. Tell your   provider if you feel dizzy or have vision changes or ringing in the ears.  · Some people get shoulder pain that can be more severe and longer-lasting than routine soreness that can follow injections. This happens very rarely.  · Any medication can cause a severe allergic reaction. Such reactions to a vaccine are estimated at about 1 in a million doses, and would happen within a few minutes to a few hours after the vaccination.  As with any medicine, there is a very remote chance of a  vaccine causing a serious injury or death.  The safety of vaccines is always being monitored. For more information, visit: www.cdc.gov/vaccinesafety/  5. What if there is a serious problem?  What should I look for?  · Look for anything that concerns you, such as signs of a severe allergic reaction, very high fever, or unusual behavior.  Signs of a severe allergic reaction can include hives, swelling of the face and throat, difficulty breathing, a fast heartbeat, dizziness, and weakness. These would usually start a few minutes to a few hours after the vaccination.  What should I do?  · If you think it is a severe allergic reaction or other emergency that can't wait, call 9-1-1 and get to the nearest hospital. Otherwise, call your health care provider.  · Afterward, the reaction should be reported to the Vaccine Adverse Event Reporting System (VAERS). Your doctor should file this report, or you can do it yourself through the VAERS web site at www.vaers.hhs.gov or by calling 1-800-822-7967.  VAERS does not give medical advice.  6. How can I learn more?  · Ask your healthcare provider. He or she can give you the vaccine package insert or suggest other sources of information.  · Call your local or state health department.  · Contact the Centers for Disease Control and Prevention (CDC):  ? Call 1-800-232-4636(1-800-CDC-INFO) or  ? Visit CDC's website at www.cdc.gov/vaccines  CDC Vaccine Information Statement (VIS) Live Zoster Vaccine (12/11/2016)  This information is not intended to replace advice given to you by your health care provider. Make sure you discuss any questions you have with your health care provider.  Document Released: 08/13/2006 Document Revised: 12/26/2016 Document Reviewed: 12/26/2016  Elsevier Interactive Patient Education © 2018 [employer]

## 2018-09-17 NOTE — Telephone Encounter (Signed)
Requested Prescriptions  Pending Prescriptions Disp Refills  . levothyroxine (SYNTHROID, LEVOTHROID) 112 MCG tablet [Pharmacy Med Name: LEVOTHYROXINE  0.112MG   TAB] 90 tablet 0    Sig: TAKE 1 TABLET BY MOUTH  DAILY BEFORE BREAKFAST     Endocrinology:  Hypothyroid Agents Failed - 09/15/2018  9:11 PM      Failed - TSH needs to be rechecked within 3 months after an abnormal result. Refill until TSH is due.      Failed - TSH in normal range and within 360 days    TSH  Date Value Ref Range Status  08/21/2017 1.100 0.450 - 4.500 uIU/mL Final         Passed - Valid encounter within last 12 months    Recent Outpatient Visits          1 month ago Flank pain   [place], Beatty T, NP   6 months ago Aching leg syndrome, unspecified laterality   [facility] [name], NP   7 months ago Hyperlipidemia, unspecified hyperlipidemia type   [facility] [name], NP   1 year ago Pure hypercholesterolemia   [facility] [name], NP   1 year ago Hypertensive CKD (chronic kidney disease)   [place] [name], NP      Future Appointments            Today Venita Lick, NP [place]

## 2018-09-17 NOTE — Assessment & Plan Note (Addendum)
Chronic, stable on Levothyroxine 112 MCG daily.  TSH today.  Adjust dose as needed.

## 2018-09-17 NOTE — Progress Notes (Signed)
BP 117/73 (BP Location: Left Arm, Patient Position: Sitting, Cuff Size: Normal)   Pulse (!) 54   Temp 98.3 F (36.8 C)   Ht 5' 4.5" (1.638 m)   Wt 199 lb 8 oz (90.5 kg)   LMP  (LMP Unknown)   SpO2 97%   BMI 33.72 kg/m    Subjective:    Patient ID: Leslie Turner, female    DOB: Mar 14, 1956, 62 y.o.   MRN: [id number]  HPI: Leslie Turner is a 62 y.o. female presents for 6 month follow-up.  Chief Complaint  Patient presents with  . Hypertension  . Hyperlipidemia  . Hypothyroidism   HYPERTENSION / HYPERLIPIDEMIA Continues on current medication regimen with no ADR reported. Satisfied with current treatment? yes Duration of hypertension: chronic BP monitoring frequency: not checking BP range:  BP medication side effects: no Past BP meds:none Duration of hyperlipidemia: chronic Cholesterol medication side effects: no Cholesterol supplements: none Past cholesterol medications: none Medication compliance: excellent compliance Aspirin: no Recent stressors: no Recurrent headaches: no Visual changes: no Palpitations: no Dyspnea: no Chest pain: no Lower extremity edema: no Dizzy/lightheaded: no   HYPOTHYROIDISM Continues on Levothyroxine 112 MCG daily.  Last TSH 08/21/17 1.100.  Thyroid control status:controlled Satisfied with current treatment? yes Medication side effects: no Medication compliance: excellent compliance Etiology of hypothyroidism:  Recent dose adjustment:no Fatigue: no Cold intolerance: no Heat intolerance: no Weight gain: no Weight loss: no Constipation: no Diarrhea/loose stools: no Palpitations: no Lower extremity edema: no Anxiety/depressed mood: no  Relevant past medical, surgical, family and social history reviewed and updated as indicated. Interim medical history since our last visit reviewed. Allergies and medications reviewed and updated.  Review of Systems  Constitutional: Negative for activity change, appetite change,  diaphoresis, fatigue, fever and unexpected weight change.  Respiratory: Negative for cough, chest tightness and shortness of breath.   Cardiovascular: Negative for chest pain, palpitations and leg swelling.  Gastrointestinal: Negative for abdominal distention, abdominal pain, constipation, diarrhea, nausea and vomiting.  Endocrine: Negative for cold intolerance, heat intolerance, polydipsia, polyphagia and polyuria.  Neurological: Negative for dizziness, syncope, weakness, light-headedness, numbness and headaches.  Psychiatric/Behavioral: Negative.     Per HPI unless specifically indicated above     Objective:    BP 117/73 (BP Location: Left Arm, Patient Position: Sitting, Cuff Size: Normal)   Pulse (!) 54   Temp 98.3 F (36.8 C)   Ht 5' 4.5" (1.638 m)   Wt 199 lb 8 oz (90.5 kg)   LMP  (LMP Unknown)   SpO2 97%   BMI 33.72 kg/m   Wt Readings from Last 3 Encounters:  09/17/18 199 lb 8 oz (90.5 kg)  08/09/18 202 lb (91.6 kg)  03/13/18 202 lb (91.6 kg)    Physical Exam  Constitutional: She is oriented to person, place, and time. She appears well-developed and well-nourished.  HENT:  Head: Normocephalic.  Eyes: Pupils are equal, round, and reactive to light. Conjunctivae and EOM are normal. Right eye exhibits no discharge. Left eye exhibits no discharge.  Neck: Trachea normal and normal range of motion. Neck supple. No JVD present. Carotid bruit is not present. No thyromegaly present.  Cardiovascular: Normal rate, regular rhythm and normal heart sounds.  Pulmonary/Chest: Effort normal and breath sounds normal.  Abdominal: Soft. Bowel sounds are normal.  Lymphadenopathy:    She has no cervical adenopathy.  Neurological: She is alert and oriented to person, place, and time.  Skin: Skin is warm and dry.  Psychiatric: She has  a normal mood and affect. Her behavior is normal. Judgment and thought content normal.  Nursing note and vitals reviewed.   Results for orders placed or  performed in visit on 08/09/18  UA/M w/rflx Culture, Routine  Result Value Ref Range   Specific Gravity, UA 1.015 1.005 - 1.030   pH, UA 7.5 5.0 - 7.5   Color, UA Yellow Yellow   Appearance Ur Hazy (A) Clear   Leukocytes, UA Negative Negative   Protein, UA Trace (A) Negative/Trace   Glucose, UA Negative Negative   Ketones, UA Negative Negative   RBC, UA Negative Negative   Bilirubin, UA Negative Negative   Urobilinogen, Ur 1.0 0.2 - 1.0 mg/dL   Nitrite, UA Negative Negative      Assessment & Plan:   Problem List Items Addressed This Visit      Cardiovascular and Mediastinum   Essential hypertension, benign    Chronic, well-controlled on current regimen.  Urine micro ALB 10 and CRT 10 with A:C <30.  CMP today.  Continue current regimen.      Relevant Medications   atorvastatin (LIPITOR) 40 MG tablet   hydrochlorothiazide (HYDRODIURIL) 25 MG tablet   losartan (COZAAR) 50 MG tablet   Other Relevant Orders   Comp Met (CMET)   Microalbumin, Urine Waived     Endocrine   Hypothyroidism    Chronic, stable on Levothyroxine 112 MCG daily.  TSH today.  Adjust dose as needed.      Relevant Medications   levothyroxine (SYNTHROID, LEVOTHROID) 112 MCG tablet   Other Relevant Orders   TSH     Other   Hyperlipidemia    Chronic, stable on daily Atorvastatin.  Lipid panel today, non fasting at this time.      Relevant Medications   atorvastatin (LIPITOR) 40 MG tablet   hydrochlorothiazide (HYDRODIURIL) 25 MG tablet   losartan (COZAAR) 50 MG tablet   Other Relevant Orders   Lipid Profile       Follow up plan: Return in about 6 months (around 03/18/2019) for HTN/HLD and depression.

## 2018-09-17 NOTE — Assessment & Plan Note (Addendum)
Chronic, stable on daily Atorvastatin.  Lipid panel today, non fasting at this time.

## 2018-09-18 ENCOUNTER — Other Ambulatory Visit: Payer: Self-pay | Admitting: Nurse Practitioner

## 2018-09-18 DIAGNOSIS — E039 Hypothyroidism, unspecified: Secondary | ICD-10-CM

## 2018-09-18 LAB — LIPID PANEL
Chol/HDL Ratio: 2.9 ratio (ref 0.0–4.4)
Cholesterol, Total: 164 mg/dL (ref 100–199)
HDL: 56 mg/dL (ref >39)
LDL Calculated: 86 mg/dL (ref 0–99)
TRIGLYCERIDES: 109 mg/dL (ref 0–149)
VLDL CHOLESTEROL CAL: 22 mg/dL (ref 5–40)

## 2018-09-18 LAB — COMPREHENSIVE METABOLIC PANEL
A/G RATIO: 1.7 (ref 1.2–2.2)
ALBUMIN: 4.3 g/dL (ref 3.6–4.8)
ALK PHOS: 93 IU/L (ref 39–117)
ALT: 19 IU/L (ref 0–32)
AST: 20 IU/L (ref 0–40)
BILIRUBIN TOTAL: 0.6 mg/dL (ref 0.0–1.2)
BUN / CREAT RATIO: 13 (ref 12–28)
BUN: 12 mg/dL (ref 8–27)
CHLORIDE: 97 mmol/L (ref 96–106)
CO2: 26 mmol/L (ref 20–29)
Calcium: 9.4 mg/dL (ref 8.7–10.3)
Creatinine, Ser: 0.91 mg/dL (ref 0.57–1.00)
GFR calc non Af Amer: 68 mL/min/{1.73_m2} (ref >59)
GFR, EST AFRICAN AMERICAN: 78 mL/min/{1.73_m2} (ref >59)
Globulin, Total: 2.5 g/dL (ref 1.5–4.5)
Glucose: 84 mg/dL (ref 65–99)
POTASSIUM: 3.8 mmol/L (ref 3.5–5.2)
Sodium: 141 mmol/L (ref 134–144)
TOTAL PROTEIN: 6.8 g/dL (ref 6.0–8.5)

## 2018-09-18 LAB — TSH: TSH: 0.272 u[IU]/mL — ABNORMAL LOW (ref 0.450–4.500)

## 2018-09-18 LAB — MICROALBUMIN, URINE WAIVED
Creatinine, Urine Waived: 10 mg/dL (ref 10–300)
Microalb, Ur Waived: 10 mg/L (ref 0–19)
Microalb/Creat Ratio: <30 mg/g (ref <30)

## 2018-09-18 MED ORDER — LEVOTHYROXINE SODIUM 88 MCG PO TABS: 88.0000 ug | ORAL_TABLET | Freq: Every day | ORAL | 3 refills | Status: DC

## 2018-09-18 NOTE — Progress Notes (Signed)
Spoke to Leslie Turner over telephone to review labs.  TSH 0.272.  Previous one year ago 1.100.  Discussed via telephone will decrease her current Levothyroxine from 112 MCG to 88 MCG daily.  Script sent to [facility].  She is to f/u in 6 weeks for TSH recheck.  Remainder of labs remain stable, discussed this with patient.

## 2018-11-01 ENCOUNTER — Encounter: Payer: Self-pay | Admitting: Nurse Practitioner

## 2018-11-01 ENCOUNTER — Ambulatory Visit (INDEPENDENT_AMBULATORY_CARE_PROVIDER_SITE_OTHER): Payer: 59 | Admitting: Nurse Practitioner

## 2018-11-01 DIAGNOSIS — F411 Generalized anxiety disorder: Secondary | ICD-10-CM | POA: Diagnosis not present

## 2018-11-01 DIAGNOSIS — E039 Hypothyroidism, unspecified: Secondary | ICD-10-CM | POA: Diagnosis not present

## 2018-11-01 DIAGNOSIS — F32A Depression, unspecified: Secondary | ICD-10-CM

## 2018-11-01 DIAGNOSIS — F329 Major depressive disorder, single episode, unspecified: Secondary | ICD-10-CM | POA: Diagnosis not present

## 2018-11-01 MED ORDER — CITALOPRAM HYDROBROMIDE 20 MG PO TABS: 20.0000 mg | ORAL_TABLET | Freq: Every day | ORAL | 3 refills | Status: DC

## 2018-11-01 MED ORDER — CITALOPRAM HYDROBROMIDE 10 MG PO TABS: 10.0000 mg | ORAL_TABLET | Freq: Every day | ORAL | 3 refills | Status: DC

## 2018-11-01 NOTE — Patient Instructions (Signed)

## 2018-11-01 NOTE — Assessment & Plan Note (Signed)
Chronic, ongoing.  Increase Citalopram to 30 MG daily and reassess in 4 weeks.  Appears anxiety > depression.

## 2018-11-01 NOTE — Assessment & Plan Note (Signed)
Chronic, ongoing.  Increase Citalopram to 30 MG daily and reassess in 4 weeks.

## 2018-11-01 NOTE — Assessment & Plan Note (Signed)
Thyroid panel today.  Adjust dose as needed.

## 2018-11-01 NOTE — Progress Notes (Signed)
BP 128/64 (BP Location: Left Arm, Patient Position: Sitting)   Pulse 61   Temp 98.9 F (37.2 C) (Oral)   Ht 5' 4.5" (1.638 m)   Wt 199 lb (90.3 kg)   LMP  (LMP Unknown)   SpO2 98%   BMI 33.63 kg/m    Subjective:    Patient ID: Leslie Turner, female    DOB: 09-Aug-1956, 63 y.o.   MRN: [id number]  HPI: Leslie Turner is a 62 y.o. female presents for follow-up  Chief Complaint  Patient presents with  . Anxiety    Pt in office w/ husband, Elta [place]. Increased anxiety/depression after Holidays, retirement, and children leaving.   . Depression   ANXIETY/STRESS Reports increased anxiety since retirement.   Will increase to Citalopram 30 MG (20 mg and 10 mg tablets = 30 mg), as she would prefer not to go to 40 MG at this time; states she tried taking 30 MG last night and it helped. Duration:exacerbated Anxious mood: yes  Excessive worrying: yes Irritability: no  Sweating: no Nausea: no Palpitations:no Hyperventilation: no Panic attacks: no Agoraphobia: no  Obscessions/compulsions: no Depressed mood: no Depression screen [facility] 2/9 11/01/2018 03/13/2018 02/19/2018 08/21/2017 02/07/2017  Decreased Interest 2 0 0 0 0  Down, Depressed, Hopeless 2 0 0 0 0  PHQ - 2 Score 4 0 0 0 0  Altered sleeping 2 - 1 0 -  Tired, decreased energy 1 - 1 0 -  Change in appetite 1 - 0 0 -  Feeling bad or failure about yourself  0 - 0 0 -  Trouble concentrating 1 - 0 0 -  Moving slowly or fidgety/restless 0 - 0 0 -  Suicidal thoughts 0 - 0 0 -  PHQ-9 Score 9 - 2 0 -  Difficult doing work/chores Somewhat difficult - - - -   Anhedonia: no Weight changes: no Insomnia: yes hard to stay asleep  Hypersomnia: no Fatigue/loss of energy: no Feelings of worthlessness: no Feelings of guilt: no Impaired concentration/indecisiveness: no Suicidal ideations: no  Crying spells: no Recent Stressors/Life Changes: yes   Relationship problems: no   Family stress: no     Financial stress: no    Job stress: yes  , retirement   Recent death/loss: no  HYPOTHYROIDISM Dose decreased at last visit to 72 MCG d/t TSH 0.272. Thyroid control status:controlled Satisfied with current treatment? yes Medication side effects: no Medication compliance: good compliance Etiology of hypothyroidism:  Recent dose adjustment:yes Fatigue: no Cold intolerance: no Heat intolerance: no Weight gain: no Weight loss: no Constipation: no Diarrhea/loose stools: no Palpitations: no Lower extremity edema: no Anxiety/depressed mood: yes  Relevant past medical, surgical, family and social history reviewed and updated as indicated. Interim medical history since our last visit reviewed. Allergies and medications reviewed and updated.  Review of Systems  Constitutional: Negative for activity change, appetite change, diaphoresis, fatigue and fever.  Respiratory: Negative for cough, chest tightness and shortness of breath.   Cardiovascular: Negative for chest pain, palpitations and leg swelling.  Gastrointestinal: Negative for abdominal distention, abdominal pain, constipation, diarrhea, nausea and vomiting.  Endocrine: Negative for cold intolerance, heat intolerance, polydipsia, polyphagia and polyuria.  Neurological: Negative for dizziness, syncope, weakness, light-headedness, numbness and headaches.  Psychiatric/Behavioral: Positive for decreased concentration and sleep disturbance (started Melatonin). Negative for behavioral problems. The patient is nervous/anxious.     Per HPI unless specifically indicated above     Objective:    BP 128/64 (BP Location: Left Arm, Patient Position:  Sitting)   Pulse 61   Temp 98.9 F (37.2 C) (Oral)   Ht 5' 4.5" (1.638 m)   Wt 199 lb (90.3 kg)   LMP  (LMP Unknown)   SpO2 98%   BMI 33.63 kg/m   Wt Readings from Last 3 Encounters:  11/01/18 199 lb (90.3 kg)  09/17/18 199 lb 8 oz (90.5 kg)  08/09/18 202 lb (91.6 kg)    Physical Exam Vitals signs and nursing note reviewed.    Constitutional:      General: She is awake.     Appearance: She is well-developed.  HENT:     Head: Normocephalic.     Right Ear: Hearing normal.     Left Ear: Hearing normal.     Nose: Nose normal.     Mouth/Throat:     Mouth: Mucous membranes are moist.  Eyes:     General: Lids are normal.        Right eye: No discharge.        Left eye: No discharge.     Conjunctiva/sclera: Conjunctivae normal.     Pupils: Pupils are equal, round, and reactive to light.  Neck:     Musculoskeletal: Normal range of motion and neck supple.     Thyroid: No thyromegaly.     Vascular: No carotid bruit or JVD.  Cardiovascular:     Rate and Rhythm: Normal rate and regular rhythm.     Heart sounds: Normal heart sounds.  Pulmonary:     Effort: Pulmonary effort is normal.     Breath sounds: Normal breath sounds.  Abdominal:     General: Bowel sounds are normal.     Palpations: Abdomen is soft. There is no hepatomegaly or splenomegaly.  Lymphadenopathy:     Cervical: No cervical adenopathy.  Skin:    General: Skin is warm and dry.  Neurological:     Mental Status: She is alert and oriented to person, place, and time.  Psychiatric:        Attention and Perception: Attention normal.        Mood and Affect: Mood normal.        Behavior: Behavior normal. Behavior is cooperative.        Thought Content: Thought content normal.        Judgment: Judgment normal.     Results for orders placed or performed in visit on 09/17/18  TSH  Result Value Ref Range   TSH 0.272 (L) 0.450 - 4.500 uIU/mL  Lipid Profile  Result Value Ref Range   Cholesterol, Total 164 100 - 199 mg/dL   Triglycerides 109 0 - 149 mg/dL   HDL 56 >39 mg/dL   VLDL Cholesterol Cal 22 5 - 40 mg/dL   LDL Calculated 86 0 - 99 mg/dL   Chol/HDL Ratio 2.9 0.0 - 4.4 ratio  Comp Met (CMET)  Result Value Ref Range   Glucose 84 65 - 99 mg/dL   BUN 12 8 - 27 mg/dL   Creatinine, Ser 0.91 0.57 - 1.00 mg/dL   GFR calc non Af Amer 68 >59  mL/min/1.73   GFR calc Af Amer 78 >59 mL/min/1.73   BUN/Creatinine Ratio 13 12 - 28   Sodium 141 134 - 144 mmol/L   Potassium 3.8 3.5 - 5.2 mmol/L   Chloride 97 96 - 106 mmol/L   CO2 26 20 - 29 mmol/L   Calcium 9.4 8.7 - 10.3 mg/dL   Total Protein 6.8 6.0 - 8.5 g/dL  Albumin 4.3 3.6 - 4.8 g/dL   Globulin, Total 2.5 1.5 - 4.5 g/dL   Albumin/Globulin Ratio 1.7 1.2 - 2.2   Bilirubin Total 0.6 0.0 - 1.2 mg/dL   Alkaline Phosphatase 93 39 - 117 IU/L   AST 20 0 - 40 IU/L   ALT 19 0 - 32 IU/L  Microalbumin, Urine Waived  Result Value Ref Range   Microalb, Ur Waived 10 0 - 19 mg/L   Creatinine, Urine Waived 10 10 - 300 mg/dL   Microalb/Creat Ratio <30 <30 mg/g      Assessment & Plan:   Problem List Items Addressed This Visit      Endocrine   Hypothyroidism    Thyroid panel today.  Adjust dose as needed.      Relevant Orders   Thyroid Panel With TSH     Other   Chronic depression    Chronic, ongoing.  Increase Citalopram to 30 MG daily and reassess in 4 weeks.  Appears anxiety > depression.      Relevant Medications   citalopram (CELEXA) 10 MG tablet   citalopram (CELEXA) 20 MG tablet   Generalized anxiety disorder    Chronic, ongoing.  Increase Citalopram to 30 MG daily and reassess in 4 weeks.      Relevant Medications   citalopram (CELEXA) 10 MG tablet   citalopram (CELEXA) 20 MG tablet       Follow up plan: Return in about 4 weeks (around 11/29/2018) for HTN, Thyroid.

## 2018-11-02 LAB — THYROID PANEL WITH TSH
Free Thyroxine Index: 2.5 (ref 1.2–4.9)
T3 UPTAKE RATIO: 26 % (ref 24–39)
T4 TOTAL: 9.7 ug/dL (ref 4.5–12.0)
TSH: 8.82 u[IU]/mL — ABNORMAL HIGH (ref 0.450–4.500)

## 2018-11-04 ENCOUNTER — Other Ambulatory Visit: Payer: 59

## 2018-11-04 ENCOUNTER — Other Ambulatory Visit: Payer: Self-pay | Admitting: Nurse Practitioner

## 2018-11-04 ENCOUNTER — Other Ambulatory Visit: Payer: Self-pay | Admitting: Unknown Physician Specialty

## 2018-11-04 MED ORDER — LEVOTHYROXINE SODIUM 100 MCG PO TABS: 100.0000 ug | ORAL_TABLET | Freq: Every day | ORAL | 5 refills | Status: DC

## 2018-11-04 NOTE — Progress Notes (Signed)
Recent TSH 8.820, currently on Levothyroxine 88 MCG.  Had decreased dose 6 weeks ago d/t TSH level 0.272.  Will increase this up to 100 MCG daily and recheck TSH in 4-6 weeks.  Discussed plan of care with patient via telephone, she was able to verbalize plan back to provider.

## 2018-11-29 ENCOUNTER — Ambulatory Visit (INDEPENDENT_AMBULATORY_CARE_PROVIDER_SITE_OTHER): Payer: 59 | Admitting: Nurse Practitioner

## 2018-11-29 ENCOUNTER — Encounter: Payer: Self-pay | Admitting: Nurse Practitioner

## 2018-11-29 VITALS — BP 111/73 | HR 59 | Temp 97.6°F | Ht 64.5 in | Wt 198.1 lb

## 2018-11-29 DIAGNOSIS — E039 Hypothyroidism, unspecified: Secondary | ICD-10-CM | POA: Diagnosis not present

## 2018-11-29 DIAGNOSIS — F411 Generalized anxiety disorder: Secondary | ICD-10-CM

## 2018-11-29 DIAGNOSIS — F329 Major depressive disorder, single episode, unspecified: Secondary | ICD-10-CM | POA: Diagnosis not present

## 2018-11-29 DIAGNOSIS — I10 Essential (primary) hypertension: Secondary | ICD-10-CM

## 2018-11-29 DIAGNOSIS — F32A Depression, unspecified: Secondary | ICD-10-CM

## 2018-11-29 MED ORDER — CITALOPRAM HYDROBROMIDE 40 MG PO TABS: 40.0000 mg | ORAL_TABLET | Freq: Every day | ORAL | 3 refills | Status: DC

## 2018-11-29 NOTE — Assessment & Plan Note (Signed)
Chronic, ongoing.  Increase Citalopram to 40 MG daily and monitor.

## 2018-11-29 NOTE — Patient Instructions (Signed)
Hypothyroidism  Hypothyroidism is when the thyroid gland does not make enough of certain hormones (it is underactive). The thyroid gland is a small gland located in the lower front part of the neck, just in front of the windpipe (trachea). This gland makes hormones that help control how the body uses food for energy (metabolism) as well as how the heart and brain function. These hormones also play a role in keeping your bones strong. When the thyroid is underactive, it produces too little of the hormones thyroxine (T4) and triiodothyronine (T3). What are the causes? This condition may be caused by:  Hashimoto's disease. This is a disease in which the body's disease-fighting system (immune system) attacks the thyroid gland. This is the most common cause.  Viral infections.  Pregnancy.  Certain medicines.  Birth defects.  Past radiation treatments to the head or neck for cancer.  Past treatment with radioactive iodine.  Past exposure to radiation in the environment.  Past surgical removal of part or all of the thyroid.  Problems with a gland in the center of the brain (pituitary gland).  Lack of enough iodine in the diet. What increases the risk? You are more likely to develop this condition if:  You are female.  You have a family history of thyroid conditions.  You use a medicine called lithium.  You take medicines that affect the immune system (immunosuppressants). What are the signs or symptoms? Symptoms of this condition include:  Feeling as though you have no energy (lethargy).  Not being able to tolerate cold.  Weight gain that is not explained by a change in diet or exercise habits.  Lack of appetite.  Dry skin.  Coarse hair.  Menstrual irregularity.  Slowing of thought processes.  Constipation.  Sadness or depression. How is this diagnosed? This condition may be diagnosed based on:  Your symptoms, your medical history, and a physical exam.  Blood  tests. You may also have imaging tests, such as an ultrasound or MRI. How is this treated? This condition is treated with medicine that replaces the thyroid hormones that your body does not make. After you begin treatment, it may take several weeks for symptoms to go away. Follow these instructions at home:  Take over-the-counter and prescription medicines only as told by your health care provider.  If you start taking any new medicines, tell your health care provider.  Keep all follow-up visits as told by your health care provider. This is important. ? As your condition improves, your dosage of thyroid hormone medicine may change. ? You will need to have blood tests regularly so that your health care provider can monitor your condition. Contact a health care provider if:  Your symptoms do not get better with treatment.  You are taking thyroid replacement medicine and you: ? Sweat a lot. ? Have tremors. ? Feel anxious. ? Lose weight rapidly. ? Cannot tolerate heat. ? Have emotional swings. ? Have diarrhea. ? Feel weak. Get help right away if you have:  Chest pain.  An irregular heartbeat.  A rapid heartbeat.  Difficulty breathing. Summary  Hypothyroidism is when the thyroid gland does not make enough of certain hormones (it is underactive).  When the thyroid is underactive, it produces too little of the hormones thyroxine (T4) and triiodothyronine (T3).  The most common cause is Hashimoto's disease, a disease in which the body's disease-fighting system (immune system) attacks the thyroid gland. The condition can also be caused by viral infections, medicine, pregnancy, or past   radiation treatment to the head or neck.  Symptoms may include weight gain, dry skin, constipation, feeling as though you do not have energy, and not being able to tolerate cold.  This condition is treated with medicine to replace the thyroid hormones that your body does not make. This information  is not intended to replace advice given to you by your health care provider. Make sure you discuss any questions you have with your health care provider. Document Released: 10/16/2005 Document Revised: 09/26/2017 Document Reviewed: 09/26/2017 Elsevier Interactive Patient Education  2019 [employer]  

## 2018-11-29 NOTE — Assessment & Plan Note (Signed)
Chronic, ongoing.  Continue current medication regimen.   

## 2018-11-29 NOTE — Progress Notes (Signed)
BP 111/73 (BP Location: Left Arm, Patient Position: Sitting, Cuff Size: Normal)   Pulse (!) 59   Temp 97.6 F (36.4 C) (Oral)   Ht 5' 4.5" (1.638 m)   Wt 198 lb 1.6 oz (89.9 kg)   LMP  (LMP Unknown)   SpO2 96%   BMI 33.48 kg/m    Subjective:    Patient ID: Leslie Turner, female    DOB: September 05, 1956, 63 y.o.   MRN: [id number]  HPI: Leslie Turner is a 63 y.o. female  Chief Complaint  Patient presents with  . Hypertension  . Hypothyroidism  . Depression   HYPERTENSION Hypertension status: controlled  Satisfied with current treatment? yes Duration of hypertension: chronic BP monitoring frequency:  daily BP range: 110-120/7-80 BP medication side effects:  no Medication compliance: excellent compliance Aspirin: no Recurrent headaches: no Visual changes: no Palpitations: no Dyspnea: no Chest pain: no Lower extremity edema: no Dizzy/lightheaded: no   HYPOTHYROIDISM Thyroid control status:stable Satisfied with current treatment? yes Medication side effects: no Medication compliance: good compliance Etiology of hypothyroidism:  Recent dose adjustment:yes, increased to 100 MCG on 11/04/2018 Fatigue: no Cold intolerance: no Heat intolerance: no Weight gain: no Weight loss: no Constipation: no Diarrhea/loose stools: no Palpitations: no Lower extremity edema: no Anxiety/depressed mood: no   DEPRESSION Reports some continued panic attacks, twice a week.  Would like to adjust Citalopram to 40 MG, currently taking 30 MG. Mood status: stable Satisfied with current treatment?: yes Symptom severity: mild  Duration of current treatment : months Side effects: no Medication compliance: good compliance Psychotherapy/counseling: none Anhedonia: no Significant weight loss or gain: no Insomnia: yes hard to fall asleep Fatigue: no Feelings of worthlessness or guilt: no Impaired concentration/indecisiveness: no Suicidal ideations: no Hopelessness: no Crying spells:  no Depression screen [facility] 2/9 11/29/2018 11/01/2018 03/13/2018 02/19/2018 08/21/2017  Decreased Interest 0 2 0 0 0  Down, Depressed, Hopeless 1 2 0 0 0  PHQ - 2 Score 1 4 0 0 0  Altered sleeping 0 2 - 1 0  Tired, decreased energy 0 1 - 1 0  Change in appetite 0 1 - 0 0  Feeling bad or failure about yourself  0 0 - 0 0  Trouble concentrating 0 1 - 0 0  Moving slowly or fidgety/restless 0 0 - 0 0  Suicidal thoughts 0 0 - 0 0  PHQ-9 Score 1 9 - 2 0  Difficult doing work/chores Not difficult at all Somewhat difficult - - -    Relevant past medical, surgical, family and social history reviewed and updated as indicated. Interim medical history since our last visit reviewed. Allergies and medications reviewed and updated.  Review of Systems  Constitutional: Negative for activity change, appetite change, diaphoresis, fatigue and fever.  Respiratory: Negative for cough, chest tightness and shortness of breath.   Cardiovascular: Negative for chest pain, palpitations and leg swelling.  Gastrointestinal: Negative for abdominal distention, abdominal pain, constipation, diarrhea, nausea and vomiting.  Endocrine: Negative for cold intolerance, heat intolerance, polydipsia, polyphagia and polyuria.  Neurological: Negative for dizziness, syncope, weakness, light-headedness, numbness and headaches.  Psychiatric/Behavioral: Negative.     Per HPI unless specifically indicated above     Objective:    BP 111/73 (BP Location: Left Arm, Patient Position: Sitting, Cuff Size: Normal)   Pulse (!) 59   Temp 97.6 F (36.4 C) (Oral)   Ht 5' 4.5" (1.638 m)   Wt 198 lb 1.6 oz (89.9 kg)   LMP  (LMP  Unknown)   SpO2 96%   BMI 33.48 kg/m   Wt Readings from Last 3 Encounters:  11/29/18 198 lb 1.6 oz (89.9 kg)  11/01/18 199 lb (90.3 kg)  09/17/18 199 lb 8 oz (90.5 kg)    Physical Exam Vitals signs and nursing note reviewed.  Constitutional:      General: She is awake.     Appearance: She is well-developed.    HENT:     Head: Normocephalic.     Right Ear: Hearing normal.     Left Ear: Hearing normal.     Nose: Nose normal.     Mouth/Throat:     Mouth: Mucous membranes are moist.  Eyes:     General: Lids are normal.        Right eye: No discharge.        Left eye: No discharge.     Conjunctiva/sclera: Conjunctivae normal.     Pupils: Pupils are equal, round, and reactive to light.  Neck:     Musculoskeletal: Normal range of motion and neck supple.     Thyroid: No thyromegaly.     Vascular: No carotid bruit or JVD.  Cardiovascular:     Rate and Rhythm: Normal rate and regular rhythm.     Heart sounds: Normal heart sounds. No murmur. No gallop.   Pulmonary:     Effort: Pulmonary effort is normal.     Breath sounds: Normal breath sounds.  Abdominal:     General: Bowel sounds are normal.     Palpations: Abdomen is soft. There is no hepatomegaly or splenomegaly.  Musculoskeletal:     Right lower leg: No edema.     Left lower leg: No edema.  Lymphadenopathy:     Cervical: No cervical adenopathy.  Skin:    General: Skin is warm and dry.  Neurological:     Mental Status: She is alert and oriented to person, place, and time.  Psychiatric:        Attention and Perception: Attention normal.        Mood and Affect: Mood normal.        Behavior: Behavior normal. Behavior is cooperative.        Thought Content: Thought content normal.        Judgment: Judgment normal.     Results for orders placed or performed in visit on 11/01/18  Thyroid Panel With TSH  Result Value Ref Range   TSH 8.820 (H) 0.450 - 4.500 uIU/mL   T4, Total 9.7 4.5 - 12.0 ug/dL   T3 Uptake Ratio 26 24 - 39 %   Free Thyroxine Index 2.5 1.2 - 4.9      Assessment & Plan:   Problem List Items Addressed This Visit      Cardiovascular and Mediastinum   Essential hypertension, benign    Chronic, ongoing.  Continue current medication regimen.          Endocrine   Hypothyroidism - Primary    Thyroid panel  today, adjust dose as needed.      Relevant Orders   Thyroid Panel With TSH     Other   Chronic depression    Chronic, ongoing.  Increase Citalopram to 40 MG daily and monitor.      Relevant Medications   citalopram (CELEXA) 40 MG tablet   Generalized anxiety disorder    Chronic, ongoing.  Increase Citalopram to 40 MG daily and monitor.      Relevant Medications   citalopram (CELEXA) 40  MG tablet       Follow up plan: Return as scheduled in May with provider.

## 2018-11-29 NOTE — Assessment & Plan Note (Signed)
Thyroid panel today, adjust dose as needed.

## 2018-11-30 LAB — THYROID PANEL WITH TSH
Free Thyroxine Index: 3 (ref 1.2–4.9)
T3 UPTAKE RATIO: 31 % (ref 24–39)
T4, Total: 9.7 ug/dL (ref 4.5–12.0)
TSH: 3.43 u[IU]/mL (ref 0.450–4.500)

## 2018-12-17 ENCOUNTER — Telehealth: Payer: Self-pay | Admitting: Nurse Practitioner

## 2018-12-17 MED ORDER — BUSPIRONE HCL 5 MG PO TABS: 5.0000 mg | ORAL_TABLET | Freq: Two times a day (BID) | ORAL | 3 refills | Status: DC

## 2018-12-17 NOTE — Telephone Encounter (Signed)
Spoke to Leslie Turner via telephone.  She discussed her continued anxiety and intermittent tearfulness.  Retired 3 months ago and since this time has had increased anxiety.  Have adjusted Citalopram and upon increase to 40 MG she reports headaches and states she started taking 30 MG instead.  Discussed adding on Buspar, which she has agreed to.  Sent script to pharmacy.

## 2018-12-17 NOTE — Telephone Encounter (Signed)
Copied from [place] [phone]. Topic: General - Other >> Dec 17, 2018  3:04 PM Carolyn Stare wrote:  Pt ask if Kendre Sires could call her she said she having some issues and would like to speak with her today.

## 2018-12-18 ENCOUNTER — Other Ambulatory Visit: Payer: Self-pay

## 2018-12-18 ENCOUNTER — Ambulatory Visit (INDEPENDENT_AMBULATORY_CARE_PROVIDER_SITE_OTHER): Payer: 59 | Admitting: Nurse Practitioner

## 2018-12-18 ENCOUNTER — Encounter: Payer: Self-pay | Admitting: Nurse Practitioner

## 2018-12-18 VITALS — BP 125/78 | HR 75 | Temp 98.3°F | Ht 64.5 in | Wt 194.0 lb

## 2018-12-18 DIAGNOSIS — F411 Generalized anxiety disorder: Secondary | ICD-10-CM

## 2018-12-18 DIAGNOSIS — E039 Hypothyroidism, unspecified: Secondary | ICD-10-CM | POA: Diagnosis not present

## 2018-12-18 MED ORDER — HYDROXYZINE HCL 25 MG PO TABS: 25.0000 mg | ORAL_TABLET | Freq: Two times a day (BID) | ORAL | 0 refills | Status: DC | PRN

## 2018-12-18 MED ORDER — SERTRALINE HCL 50 MG PO TABS: 25.0000 mg | ORAL_TABLET | Freq: Every day | ORAL | 3 refills | Status: DC

## 2018-12-18 NOTE — Progress Notes (Signed)
BP 125/78   Pulse 75   Temp 98.3 F (36.8 C) (Oral)   Ht 5' 4.5" (1.638 m)   Wt 194 lb (88 kg)   LMP  (LMP Unknown)   SpO2 96%   BMI 32.79 kg/m    Subjective:    Patient ID: Leslie Turner, female    DOB: Dec 26, 1955, 63 y.o.   MRN: [id number]  HPI: Leslie Turner is a 63 y.o. female  Chief Complaint  Patient presents with  . Medication Management    citalopram   DEPRESSION Started having headaches with 40 MG Citalopram, has returned to 30 MG.  Started on Buspar 5 mg last night and took second dose this morning at 11 states no "reall help" with this.  Has had ongoing issues with anxiety since her retirement on December 20th.  Her husband is present at bedside with her today and is able to assist in history.  Has been on Citalopram for several years, after an incident of anxiety while in a mall in [place].  This regimen has worked majority of the time.  Her husband does report pt is someone "who does better with structure" and has had incidents in past where she has had less structure or no control over situation, which has led to increased anxiety for a few days.  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  Reports she finds her self with increased tearfulness, sweaty palms, and feeling overwhelmed.  When she has visitors she only enjoys their visit for a little bit and then begins feeling anxious about them leaving and being tearful.  At this time she has a daughter who is currently taking Sertraline and Ativan, which is beneficial for her mood. Mood status: uncontrolled Satisfied with current treatment?: no Symptom severity: moderate  Duration of current treatment : years Side effects: no Medication compliance: excellent compliance Psychotherapy/counseling: none Previous psychiatric medications: Citalopram and Buspar Anhedonia: no Significant weight loss or gain: no Insomnia: yes hard to fall asleep Fatigue:  yes Feelings of worthlessness or guilt: no Impaired concentration/indecisiveness: yes Suicidal ideations: no Hopelessness: no Crying spells: yes Depression screen [facility] 2/9 12/18/2018 11/29/2018 11/01/2018 03/13/2018 02/19/2018  Decreased Interest 1 0 2 0 0  Down, Depressed, Hopeless 2 1 2  0 0  PHQ - 2 Score 3 1 4  0 0  Altered sleeping 2 0 2 - 1  Tired, decreased energy 1 0 1 - 1  Change in appetite 2 0 1 - 0  Feeling bad or failure about yourself  0 0 0 - 0  Trouble concentrating 1 0 1 - 0  Moving slowly or fidgety/restless 0 0 0 - 0  Suicidal thoughts 0 0 0 - 0  PHQ-9 Score 9 1 9  - 2  Difficult doing work/chores Somewhat difficult Not difficult at all Somewhat difficult - -   GAD 7 : Generalized Anxiety Score 12/18/2018 11/29/2018 11/01/2018  Nervous, Anxious, on Edge 1 1 1   Control/stop worrying 1 0 1  Worry too much - different things 2 1 1   Trouble relaxing 2 1 1   Restless 0 1 0  Easily annoyed or irritable 1 0 0  Afraid - awful might happen 0 0 1  Total GAD 7 Score 7 4 5   Anxiety Difficulty Somewhat difficult Somewhat difficult Somewhat difficult     Relevant past medical, surgical, family and social history reviewed and updated as indicated. Interim medical history since our last visit  reviewed. Allergies and medications reviewed and updated.  Review of Systems  Constitutional: Negative for activity change, appetite change, diaphoresis, fatigue and fever.  Respiratory: Negative for cough, chest tightness and shortness of breath.   Cardiovascular: Negative for chest pain, palpitations and leg swelling.  Gastrointestinal: Negative for abdominal distention, abdominal pain, constipation, diarrhea, nausea and vomiting.  Endocrine: Negative for cold intolerance, heat intolerance, polydipsia, polyphagia and polyuria.  Neurological: Negative for dizziness, syncope, weakness, light-headedness, numbness and headaches.  Psychiatric/Behavioral: Positive for decreased concentration and sleep  disturbance. Negative for self-injury and suicidal ideas. The patient is nervous/anxious.     Per HPI unless specifically indicated above     Objective:    BP 125/78   Pulse 75   Temp 98.3 F (36.8 C) (Oral)   Ht 5' 4.5" (1.638 m)   Wt 194 lb (88 kg)   LMP  (LMP Unknown)   SpO2 96%   BMI 32.79 kg/m   Wt Readings from Last 3 Encounters:  12/18/18 194 lb (88 kg)  11/29/18 198 lb 1.6 oz (89.9 kg)  11/01/18 199 lb (90.3 kg)    Physical Exam Vitals signs and nursing note reviewed.  Constitutional:      Appearance: She is well-developed.  HENT:     Head: Normocephalic.  Eyes:     General:        Right eye: No discharge.        Left eye: No discharge.     Conjunctiva/sclera: Conjunctivae normal.     Pupils: Pupils are equal, round, and reactive to light.  Neck:     Musculoskeletal: Normal range of motion and neck supple.     Thyroid: No thyromegaly.     Vascular: No carotid bruit or JVD.  Cardiovascular:     Rate and Rhythm: Normal rate and regular rhythm.     Heart sounds: Normal heart sounds.  Pulmonary:     Effort: Pulmonary effort is normal.     Breath sounds: Normal breath sounds.  Abdominal:     General: Bowel sounds are normal.     Palpations: Abdomen is soft.  Lymphadenopathy:     Cervical: No cervical adenopathy.  Skin:    General: Skin is warm and dry.  Neurological:     Mental Status: She is alert and oriented to person, place, and time.  Psychiatric:        Attention and Perception: Attention normal.        Mood and Affect: Mood is anxious.        Behavior: Behavior normal.        Thought Content: Thought content normal.        Judgment: Judgment normal.     Comments: Intermittent periods of tearfulness and anxiety noted during exam.       Results for orders placed or performed in visit on 11/29/18  Thyroid Panel With TSH  Result Value Ref Range   TSH 3.430 0.450 - 4.500 uIU/mL   T4, Total 9.7 4.5 - 12.0 ug/dL   T3 Uptake Ratio 31 24 - 39 %    Free Thyroxine Index 3.0 1.2 - 4.9      Assessment & Plan:   Problem List Items Addressed This Visit      Endocrine   Hypothyroidism   Relevant Orders   Thyroid Panel With TSH     Other   Generalized anxiety disorder - Primary    Chronic, ongoing.  Will switch from Citalopram to Sertraline with starting dose 25  MG, add on Vistaril as needed for anxiety twice a day.  Recommend to start these tomorrow and is aware not to take Citalopram or Buspar.  Recommend therapy referral to assist during this transitional phase of life, she wishes to think about this further.  Follow-up in 4 weeks for mood.      Relevant Medications   sertraline (ZOLOFT) 50 MG tablet   hydrOXYzine (ATARAX/VISTARIL) 25 MG tablet       Follow up plan: Return in about 4 weeks (around 01/15/2019) for depression.

## 2018-12-18 NOTE — Patient Instructions (Signed)

## 2018-12-18 NOTE — Assessment & Plan Note (Addendum)
Chronic, ongoing.  Will switch from Citalopram to Sertraline with starting dose 25 MG, add on Vistaril as needed for anxiety twice a day.  Recommend to start these tomorrow and is aware not to take Citalopram or Buspar.  Recommend therapy referral to assist during this transitional phase of life, she wishes to think about this further.  Follow-up in 4 weeks for mood.

## 2018-12-19 LAB — THYROID PANEL WITH TSH
Free Thyroxine Index: 3.3 (ref 1.2–4.9)
T3 Uptake Ratio: 31 % (ref 24–39)
T4, Total: 10.8 ug/dL (ref 4.5–12.0)
TSH: 1.96 u[IU]/mL (ref 0.450–4.500)

## 2018-12-30 ENCOUNTER — Telehealth: Payer: Self-pay | Admitting: Nurse Practitioner

## 2018-12-30 NOTE — Telephone Encounter (Signed)
Spoke to Ms. Flett on phone to offer support.  She reports "I won't tell you I am not feeling better with medication change, I am just not where I want to be yet".  She was started on Sertraline, along with Hydroxyzine PRN in January for anxiety/depression exacerbated by recent retirement.  We increased dose two weeks ago to 50 MG daily and she reports this is still not helping to where she wishes to be.  Have discussed at length with her multiple times that medication takes 4 weeks to get to full benefit and she can take Hydroxyzine BID as needed at this time to help with moments she feels more anxious, such as when a family member who is visiting has to leave to go home after a long visit.  She reports he daughter takes 100 MG Sertraline daily and also that her neighbor is a Insurance claims handler with a psych provider and she feels my dose is still too low".  Discussed with patient trial of going to 100 MG, she can take two 50 MG tablet at this time until bottle complete.  She would like to try this, again I reiterated that this may take 4-6 weeks to reach full benefit and that she can use Hydroxyzine as needed at this time.  She reports last week she was only use Hydroxyzine once a day, but past couple days she has had to use it twice a day as it is getting close to time that her niece leaves from her recent long visit and she is upset about this.  Discussed benefit of therapy along with medication, she continues to wish to have therapy until her medication has her in "a better spot".  She denies SI/HI.

## 2018-12-30 NOTE — Telephone Encounter (Signed)
Patient started on sertraline 25 mg on 12/18/2018 and it was increased to 50 mg on 12/26/17

## 2018-12-30 NOTE — Telephone Encounter (Signed)
Spoke to patient on phone.  Thank you

## 2019-01-08 ENCOUNTER — Ambulatory Visit (INDEPENDENT_AMBULATORY_CARE_PROVIDER_SITE_OTHER): Payer: 59 | Admitting: Nurse Practitioner

## 2019-01-08 ENCOUNTER — Encounter: Payer: Self-pay | Admitting: Nurse Practitioner

## 2019-01-08 ENCOUNTER — Other Ambulatory Visit: Payer: Self-pay

## 2019-01-08 VITALS — BP 123/76 | HR 75 | Temp 98.0°F | Ht 64.5 in | Wt 192.0 lb

## 2019-01-08 DIAGNOSIS — E039 Hypothyroidism, unspecified: Secondary | ICD-10-CM | POA: Diagnosis not present

## 2019-01-08 DIAGNOSIS — F411 Generalized anxiety disorder: Secondary | ICD-10-CM | POA: Diagnosis not present

## 2019-01-08 MED ORDER — SERTRALINE HCL 100 MG PO TABS: 100.0000 mg | ORAL_TABLET | Freq: Every day | ORAL | 1 refills | Status: DC

## 2019-01-08 MED ORDER — LEVOTHYROXINE SODIUM 100 MCG PO TABS: 100.0000 ug | ORAL_TABLET | Freq: Every day | ORAL | 5 refills | Status: DC

## 2019-01-08 MED ORDER — HYDROXYZINE HCL 25 MG PO TABS: 25.0000 mg | ORAL_TABLET | Freq: Two times a day (BID) | ORAL | 0 refills | Status: DC | PRN

## 2019-01-08 MED ORDER — SERTRALINE HCL 25 MG PO TABS: 25.0000 mg | ORAL_TABLET | Freq: Every day | ORAL | 3 refills | Status: DC

## 2019-01-08 MED ORDER — LEVOTHYROXINE SODIUM 88 MCG PO TABS: 88.0000 ug | ORAL_TABLET | Freq: Every day | ORAL | 3 refills | Status: DC

## 2019-01-08 NOTE — Progress Notes (Signed)
BP 123/76   Pulse 75   Temp 98 F (36.7 C) (Oral)   Ht 5' 4.5" (1.638 m)   Wt 192 lb (87.1 kg)   LMP  (LMP Unknown)   SpO2 97%   BMI 32.45 kg/m    Subjective:    Patient ID: Leslie Turner, female    DOB: Apr 10, 1956, 63 y.o.   MRN: [id number]  HPI: Leslie Turner is a 63 y.o. female  Chief Complaint  Patient presents with  . Depression    f/u   DEPRESSION/ANXIETY Leslie Turner started having increase in anxiety after recent retirement at end of 2019. She was switched from Citalopram, which was not helping mood, to Sertraline recently (Leslie daughter takes this at 125 MG and it works for Leslie) + Hydroxyzine as needed.  On 01/06/19 dose was increased to 100 MG daily Sertraline.  She reports she is "not where I want to be yet, but am getting better".  Leslie Turner reports that last week Leslie Turner had a "good week, especially at end of week".  She also recently was able to cope with Leslie niece leaving to go back home, as patient was focused on upcoming travels to see Leslie daughter.  Prior to this she was having difficulty whenever a visitor left.  Discussed at length that having goals and focusing on each day at a time is beneficial for overall mood.  Leslie Turner reports he has noted Leslie to have some nausea with dose increase, had one episode of emesis but he reports this was at [facility] where she has had issues with food before. She states last week she only needed to take Hydroxyzine once a day and this week she has taken it twice a day "only a couple times".  Denies SI/HI.   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  Reviewed mindfulness apps and discussed using yoga videos.  Discussed volunteering in community.  She reports at home she never started taking Levothyroxine 100 MCG, but has been taking 88 MCG and requests refill today. Mood status: stable Satisfied with current treatment?: no, would like increase to 125 MG  daily Symptom severity: moderate  Duration of current treatment : months Side effects: no, Turner reports she has had some nausea with dose increase Medication compliance: excellent compliance Psychotherapy/counseling: refuses Previous psychiatric medications: Citalopram Depressed mood: no Anxious mood: yes Anhedonia: no Significant weight loss or gain: no Insomnia: no Fatigue: no Feelings of worthlessness or guilt: no Impaired concentration/indecisiveness: yes Suicidal ideations: no Hopelessness: yes Crying spells: yes Depression screen [facility] 2/9 01/08/2019 12/18/2018 11/29/2018 11/01/2018 03/13/2018  Decreased Interest 1 1 0 2 0  Down, Depressed, Hopeless 1 2 1 2  0  PHQ - 2 Score 2 3 1 4  0  Altered sleeping 0 2 0 2 -  Tired, decreased energy 1 1 0 1 -  Change in appetite 2 2 0 1 -  Feeling bad or failure about yourself  0 0 0 0 -  Trouble concentrating 1 1 0 1 -  Moving slowly or fidgety/restless 0 0 0 0 -  Suicidal thoughts 0 0 0 0 -  PHQ-9 Score 6 9 1 9  -  Difficult doing work/chores - Somewhat difficult Not difficult at all Somewhat difficult -   GAD 7 : Generalized Anxiety Score 01/08/2019 12/18/2018 11/29/2018 11/01/2018  Nervous, Anxious, on Edge 1 1 1 1   Control/stop worrying 1 1 0  1  Worry too much - different things 1 2 1 1   Trouble relaxing 1 2 1 1   Restless 0 0 1 0  Easily annoyed or irritable 1 1 0 0  Afraid - awful might happen 0 0 0 1  Total GAD 7 Score 5 7 4 5   Anxiety Difficulty - Somewhat difficult Somewhat difficult Somewhat difficult     Relevant past medical, surgical, family and social history reviewed and updated as indicated. Interim medical history since our last visit reviewed. Allergies and medications reviewed and updated.  Review of Systems  Constitutional: Negative for activity change, appetite change, diaphoresis, fatigue and fever.  Respiratory: Negative for cough, chest tightness and shortness of breath.   Cardiovascular: Negative for chest  pain, palpitations and leg swelling.  Gastrointestinal: Negative for abdominal distention, abdominal pain, constipation, diarrhea, nausea and vomiting.  Endocrine: Negative for cold intolerance, heat intolerance, polydipsia, polyphagia and polyuria.  Neurological: Negative for dizziness, syncope, weakness, light-headedness, numbness and headaches.  Psychiatric/Behavioral: Negative for decreased concentration, self-injury, sleep disturbance and suicidal ideas. The patient is nervous/anxious.     Per HPI unless specifically indicated above     Objective:    BP 123/76   Pulse 75   Temp 98 F (36.7 C) (Oral)   Ht 5' 4.5" (1.638 m)   Wt 192 lb (87.1 kg)   LMP  (LMP Unknown)   SpO2 97%   BMI 32.45 kg/m   Wt Readings from Last 3 Encounters:  01/08/19 192 lb (87.1 kg)  12/18/18 194 lb (88 kg)  11/29/18 198 lb 1.6 oz (89.9 kg)    Physical Exam Vitals signs and nursing note reviewed.  Constitutional:      Appearance: She is well-developed.  HENT:     Head: Normocephalic.  Eyes:     General:        Right eye: No discharge.        Left eye: No discharge.     Conjunctiva/sclera: Conjunctivae normal.     Pupils: Pupils are equal, round, and reactive to light.  Neck:     Musculoskeletal: Normal range of motion and neck supple.     Thyroid: No thyromegaly.     Vascular: No carotid bruit or JVD.  Cardiovascular:     Rate and Rhythm: Normal rate and regular rhythm.     Heart sounds: Normal heart sounds.  Pulmonary:     Effort: Pulmonary effort is normal.     Breath sounds: Normal breath sounds.  Abdominal:     General: Bowel sounds are normal.     Palpations: Abdomen is soft.  Lymphadenopathy:     Cervical: No cervical adenopathy.  Skin:    General: Skin is warm and dry.  Neurological:     Mental Status: She is alert and oriented to person, place, and time.  Psychiatric:        Attention and Perception: Attention normal.        Mood and Affect: Mood is anxious.         Speech: Speech normal.        Behavior: Behavior normal.        Thought Content: Thought content normal.        Cognition and Memory: Cognition normal.        Judgment: Judgment normal.     Comments: Intermittent anxious mood with occasional tearfulness became less tearful during appointment and conversation.     Results for orders placed or performed in visit on 12/18/18  Thyroid Panel With TSH  Result Value Ref Range   TSH 1.960 0.450 - 4.500 uIU/mL   T4, Total 10.8 4.5 - 12.0 ug/dL   T3 Uptake Ratio 31 24 - 39 %   Free Thyroxine Index 3.3 1.2 - 4.9      Assessment & Plan:   Problem List Items Addressed This Visit      Endocrine   Hypothyroidism    Refill sent on 88 MCG Levothyroxine, patient never increase to 100 MCG.  Labs done last visit.      Relevant Medications   levothyroxine (SYNTHROID, LEVOTHROID) 88 MCG tablet     Other   Generalized anxiety disorder - Primary    Not at goal for mood.  Will increase Sertraline to 125 MG daily and continue Hydroxyzine.  Discussed that this medication takes 4-6 weeks to achieve full affect and encouraged patience during this transitional period.  Discussed other tools to utilize to assist with anxiety, refer to HPI.      Relevant Medications   sertraline (ZOLOFT) 100 MG tablet   sertraline (ZOLOFT) 25 MG tablet   hydrOXYzine (ATARAX/VISTARIL) 25 MG tablet      Time: 25 minutes, >50% spent counseling on situational anxiety with recent retirement and education at length on medications and tools to use  Follow up plan: Return in about 4 weeks (around 02/05/2019) for mood follow-up.

## 2019-01-08 NOTE — Assessment & Plan Note (Signed)
Refill sent on 88 MCG Levothyroxine, patient never increase to 100 MCG.  Labs done last visit.

## 2019-01-08 NOTE — Patient Instructions (Addendum)
.The Mindfulness App.  .Headspace.  .Calm.  Marland KitchenMINDBODY.  .Buddhify.  .Insight Timer.  .Smiling Mind.  .Meditation Timer Pro.  Madelynn Done    Generalized Anxiety Disorder, Adult Generalized anxiety disorder (GAD) is a mental health disorder. People with this condition constantly worry about everyday events. Unlike normal anxiety, worry related to GAD is not triggered by a specific event. These worries also do not fade or get better with time. GAD interferes with life functions, including relationships, work, and school. GAD can vary from mild to severe. People with severe GAD can have intense waves of anxiety with physical symptoms (panic attacks). What are the causes? The exact cause of GAD is not known. What increases the risk? This condition is more likely to develop in:  Women.  People who have a family history of anxiety disorders.  People who are very shy.  People who experience very stressful life events, such as the death of a loved one.  People who have a very stressful family environment. What are the signs or symptoms? People with GAD often worry excessively about many things in their lives, such as their health and family. They may also be overly concerned about:  Doing well at work.  Being on time.  Natural disasters.  Friendships. Physical symptoms of GAD include:  Fatigue.  Muscle tension or having muscle twitches.  Trembling or feeling shaky.  Being easily startled.  Feeling like your heart is pounding or racing.  Feeling out of breath or like you cannot take a deep breath.  Having trouble falling asleep or staying asleep.  Sweating.  Nausea, diarrhea, or irritable bowel syndrome (IBS).  Headaches.  Trouble concentrating or remembering facts.  Restlessness.  Irritability. How is this diagnosed? Your health care provider can diagnose GAD based on your symptoms and medical history. You will also have a physical exam. The health care provider  will ask specific questions about your symptoms, including how severe they are, when they started, and if they come and go. Your health care provider may ask you about your use of alcohol or drugs, including prescription medicines. Your health care provider may refer you to a mental health specialist for further evaluation. Your health care provider will do a thorough examination and may perform additional tests to rule out other possible causes of your symptoms. To be diagnosed with GAD, a person must have anxiety that:  Is out of his or her control.  Affects several different aspects of his or her life, such as work and relationships.  Causes distress that makes him or her unable to take part in normal activities.  Includes at least three physical symptoms of GAD, such as restlessness, fatigue, trouble concentrating, irritability, muscle tension, or sleep problems. Before your health care provider can confirm a diagnosis of GAD, these symptoms must be present more days than they are not, and they must last for six months or longer. How is this treated? The following therapies are usually used to treat GAD:  Medicine. Antidepressant medicine is usually prescribed for long-term daily control. Antianxiety medicines may be added in severe cases, especially when panic attacks occur.  Talk therapy (psychotherapy). Certain types of talk therapy can be helpful in treating GAD by providing support, education, and guidance. Options include: ? Cognitive behavioral therapy (CBT). People learn coping skills and techniques to ease their anxiety. They learn to identify unrealistic or negative thoughts and behaviors and to replace them with positive ones. ? Acceptance and commitment therapy (ACT). This treatment teaches  people how to be mindful as a way to cope with unwanted thoughts and feelings. ? Biofeedback. This process trains you to manage your body's response (physiological response) through breathing  techniques and relaxation methods. You will work with a therapist while machines are used to monitor your physical symptoms.  Stress management techniques. These include yoga, meditation, and exercise. A mental health specialist can help determine which treatment is best for you. Some people see improvement with one type of therapy. However, other people require a combination of therapies. Follow these instructions at home:  Take over-the-counter and prescription medicines only as told by your health care provider.  Try to maintain a normal routine.  Try to anticipate stressful situations and allow extra time to manage them.  Practice any stress management or self-calming techniques as taught by your health care provider.  Do not punish yourself for setbacks or for not making progress.  Try to recognize your accomplishments, even if they are small.  Keep all follow-up visits as told by your health care provider. This is important. Contact a health care provider if:  Your symptoms do not get better.  Your symptoms get worse.  You have signs of depression, such as: ? A persistently sad, cranky, or irritable mood. ? Loss of enjoyment in activities that used to bring you joy. ? Change in weight or eating. ? Changes in sleeping habits. ? Avoiding friends or family members. ? Loss of energy for normal tasks. ? Feelings of guilt or worthlessness. Get help right away if:  You have serious thoughts about hurting yourself or others. If you ever feel like you may hurt yourself or others, or have thoughts about taking your own life, get help right away. You can go to your nearest emergency department or call:  Your local emergency services (911 in the [place]  A suicide crisis helpline, such as the [place] at 458-838-4812. This is open 24 hours a day. Summary  Generalized anxiety disorder (GAD) is a mental health disorder that involves worry that is not  triggered by a specific event.  People with GAD often worry excessively about many things in their lives, such as their health and family.  GAD may cause physical symptoms such as restlessness, trouble concentrating, sleep problems, frequent sweating, nausea, diarrhea, headaches, and trembling or muscle twitching.  A mental health specialist can help determine which treatment is best for you. Some people see improvement with one type of therapy. However, other people require a combination of therapies. This information is not intended to replace advice given to you by your health care provider. Make sure you discuss any questions you have with your health care provider. Document Released: 02/10/2013 Document Revised: 09/05/2016 Document Reviewed: 09/05/2016 Elsevier Interactive Patient Education  2019 [employer].

## 2019-01-08 NOTE — Assessment & Plan Note (Signed)
Not at goal for mood.  Will increase Sertraline to 125 MG daily and continue Hydroxyzine.  Discussed that this medication takes 4-6 weeks to achieve full affect and encouraged patience during this transitional period.  Discussed other tools to utilize to assist with anxiety, refer to HPI.

## 2019-01-14 ENCOUNTER — Other Ambulatory Visit: Payer: Self-pay | Admitting: Nurse Practitioner

## 2019-01-14 MED ORDER — SERTRALINE HCL 25 MG PO TABS: 25.0000 mg | ORAL_TABLET | Freq: Every day | ORAL | 2 refills | Status: DC

## 2019-01-14 MED ORDER — SERTRALINE HCL 100 MG PO TABS: 100.0000 mg | ORAL_TABLET | Freq: Every day | ORAL | 2 refills | Status: DC

## 2019-01-14 NOTE — Progress Notes (Signed)
Sertraline refills sent to [facility].

## 2019-01-16 ENCOUNTER — Ambulatory Visit: Payer: 59 | Admitting: Nurse Practitioner

## 2019-02-05 ENCOUNTER — Ambulatory Visit: Payer: 59 | Admitting: Nurse Practitioner

## 2019-03-18 ENCOUNTER — Ambulatory Visit (INDEPENDENT_AMBULATORY_CARE_PROVIDER_SITE_OTHER): Payer: 59 | Admitting: Nurse Practitioner

## 2019-03-18 ENCOUNTER — Encounter: Payer: Self-pay | Admitting: Nurse Practitioner

## 2019-03-18 ENCOUNTER — Ambulatory Visit: Payer: 59 | Admitting: Nurse Practitioner

## 2019-03-18 ENCOUNTER — Other Ambulatory Visit: Payer: Self-pay

## 2019-03-18 VITALS — Ht 64.5 in | Wt 184.0 lb

## 2019-03-18 DIAGNOSIS — F329 Major depressive disorder, single episode, unspecified: Secondary | ICD-10-CM

## 2019-03-18 DIAGNOSIS — F411 Generalized anxiety disorder: Secondary | ICD-10-CM

## 2019-03-18 DIAGNOSIS — F32A Depression, unspecified: Secondary | ICD-10-CM

## 2019-03-18 MED ORDER — SERTRALINE HCL 50 MG PO TABS: 50.0000 mg | ORAL_TABLET | Freq: Every day | ORAL | 2 refills | Status: DC

## 2019-03-18 NOTE — Patient Instructions (Signed)

## 2019-03-18 NOTE — Assessment & Plan Note (Signed)
Ongoing, improved depression but continued anxiety.  Increase Sertraline to 150 MG.  Return in 4 weeks.

## 2019-03-18 NOTE — Assessment & Plan Note (Signed)
Had improved, but with recent Covid pandemic is having increased anxiety.  Will increase Sertraline to 150 MG and continued Hydroxyzine as needed.  Continued to recommend therapy, patient not interested at this time.  Therapy would benefit mood and anxiety due to her recent retirement and transition in life.  Return in 4 weeks for follow-up mood and labs.

## 2019-03-18 NOTE — Progress Notes (Signed)
Ht 5' 4.5" (1.638 m)   Wt 184 lb (83.5 kg)   LMP  (LMP Unknown)   BMI 31.10 kg/m    Subjective:    Patient ID: Leslie Turner, female    DOB: 02-06-1956, 63 y.o.   MRN: [id number]  HPI: Leslie Turner is a 63 y.o. female  Chief Complaint  Patient presents with  . Anxiety    Medication follow-up    . This visit was completed via WebEx due to the restrictions of the COVID-19 pandemic. All issues as above were discussed and addressed. Physical exam was done as above through visual confirmation on WebEx. If it was felt that the patient should be evaluated in the office, they were directed there. The patient verbally consented to this visit. . Location of the patient: home . Location of the provider: home . Those involved with this call:  . Provider: Marnee Guarneri, DNP . CMA: Merilyn Baba, CMA . Front Desk/Registration: Linard Millers  . Time spent on call: 20 minutes with patient face to face via video conference. More than 50% of this time was spent in counseling and coordination of care. 10 minutes total spent in review of patient's record and preparation of their chart. I verified patient identity using two factors (patient name and date of birth). Patient consents verbally to being seen via telemedicine visit today.   ANXIETY/STRESS Currently on Sertraline 125 MG, which has been working well without ADR reported.  States for "the most part I think I am doing better", but "every once and awhile I feel weird".  States Hydroxyzine helps when she is feeling more anxious.  Reports triggers with change in environment, someone visiting and leaving or gloomy weather.  Over past week has taken Hydroxyzine 5 out of 7 days.  States occasional night sweats noted since coming off Citalopram, her last menstrual cycle was several years ago.  Currently not seeing therapy, but has been encouraged to do so as stressors started after retirement with change in life. Duration:exacerbated Anxious mood:  occasional  Excessive worrying: occasional Irritability: no  Sweating: occasional episodes Nausea: no Palpitations:no Hyperventilation: no Panic attacks: no Agoraphobia: no  Obscessions/compulsions: no Depressed mood: yes Depression screen [facility] 2/9 03/18/2019 01/08/2019 12/18/2018 11/29/2018 11/01/2018  Decreased Interest 0 1 1 0 2  Down, Depressed, Hopeless 0 1 2 1 2   PHQ - 2 Score 0 2 3 1 4   Altered sleeping 0 0 2 0 2  Tired, decreased energy 1 1 1  0 1  Change in appetite 2 2 2  0 1  Feeling bad or failure about yourself  1 0 0 0 0  Trouble concentrating 1 1 1  0 1  Moving slowly or fidgety/restless 0 0 0 0 0  Suicidal thoughts 0 0 0 0 0  PHQ-9 Score 5 6 9 1 9   Difficult doing work/chores Not difficult at all - Somewhat difficult Not difficult at all Somewhat difficult   Anhedonia: no Weight changes: no Insomnia: none Hypersomnia: no Fatigue/loss of energy: no Feelings of worthlessness: no Feelings of guilt: no Impaired concentration/indecisiveness: no Suicidal ideations: no  Crying spells: occasional Recent Stressors/Life Changes: yes. Covid pandemic   Relationship problems: no   Family stress: no     Financial stress: no    Job stress: no    Recent death/loss: no  GAD 7 : Generalized Anxiety Score 03/18/2019 01/08/2019 12/18/2018 11/29/2018  Nervous, Anxious, on Edge 2 1 1 1   Control/stop worrying 1 1 1  0  Worry  too much - different things 1 1 2 1   Trouble relaxing 1 1 2 1   Restless 1 0 0 1  Easily annoyed or irritable 0 1 1 0  Afraid - awful might happen 2 0 0 0  Total GAD 7 Score 8 5 7 4   Anxiety Difficulty Not difficult at all - Somewhat difficult Somewhat difficult     Relevant past medical, surgical, family and social history reviewed and updated as indicated. Interim medical history since our last visit reviewed. Allergies and medications reviewed and updated.  Review of Systems  Constitutional: Negative for activity change, appetite change, diaphoresis,  fatigue and fever.  Respiratory: Negative for cough, chest tightness and shortness of breath.   Cardiovascular: Negative for chest pain, palpitations and leg swelling.  Gastrointestinal: Negative for abdominal distention, abdominal pain, constipation, diarrhea, nausea and vomiting.  Neurological: Negative for dizziness, syncope, weakness, light-headedness, numbness and headaches.  Psychiatric/Behavioral: Negative.     Per HPI unless specifically indicated above     Objective:    Ht 5' 4.5" (1.638 m)   Wt 184 lb (83.5 kg)   LMP  (LMP Unknown)   BMI 31.10 kg/m   Wt Readings from Last 3 Encounters:  03/18/19 184 lb (83.5 kg)  01/08/19 192 lb (87.1 kg)  12/18/18 194 lb (88 kg)    Physical Exam Vitals signs and nursing note reviewed.  Constitutional:      General: She is awake. She is not in acute distress.    Appearance: She is well-developed. She is not ill-appearing.  HENT:     Head: Normocephalic.     Right Ear: Hearing normal.     Left Ear: Hearing normal.  Eyes:     General: Lids are normal.        Right eye: No discharge.        Left eye: No discharge.     Conjunctiva/sclera: Conjunctivae normal.  Neck:     Musculoskeletal: Normal range of motion.  Cardiovascular:     Comments: Unable to auscultate due to virtual exam only Pulmonary:     Effort: Pulmonary effort is normal. No accessory muscle usage or respiratory distress.     Comments: Unable to auscultate due to virtual exam only Neurological:     Mental Status: She is alert and oriented to person, place, and time.  Psychiatric:        Attention and Perception: Attention normal.        Mood and Affect: Mood normal.        Behavior: Behavior normal. Behavior is cooperative.        Thought Content: Thought content normal.        Judgment: Judgment normal.     Results for orders placed or performed in visit on 12/18/18  Thyroid Panel With TSH  Result Value Ref Range   TSH 1.960 0.450 - 4.500 uIU/mL   T4,  Total 10.8 4.5 - 12.0 ug/dL   T3 Uptake Ratio 31 24 - 39 %   Free Thyroxine Index 3.3 1.2 - 4.9      Assessment & Plan:   Problem List Items Addressed This Visit      Other   Chronic depression    Ongoing, improved depression but continued anxiety.  Increase Sertraline to 150 MG.  Return in 4 weeks.      Relevant Medications   sertraline (ZOLOFT) 50 MG tablet   Generalized anxiety disorder - Primary    Had improved, but with recent Covid pandemic  is having increased anxiety.  Will increase Sertraline to 150 MG and continued Hydroxyzine as needed.  Continued to recommend therapy, patient not interested at this time.  Therapy would benefit mood and anxiety due to her recent retirement and transition in life.  Return in 4 weeks for follow-up mood and labs.      Relevant Medications   sertraline (ZOLOFT) 50 MG tablet      I discussed the assessment and treatment plan with the patient. The patient was provided an opportunity to ask questions and all were answered. The patient agreed with the plan and demonstrated an understanding of the instructions.   The patient was advised to call back or seek an in-person evaluation if the symptoms worsen or if the condition fails to improve as anticipated.   I provided 20+ minutes of time during this encounter offering support with mood and discussing medication.  Follow up plan: Return in about 4 weeks (around 04/15/2019) for Mood, Hypothyroid, HTN/HLD (labs needed).

## 2019-03-21 ENCOUNTER — Other Ambulatory Visit: Payer: Self-pay | Admitting: Nurse Practitioner

## 2019-03-26 ENCOUNTER — Ambulatory Visit: Payer: 59 | Admitting: Nurse Practitioner

## 2019-04-09 ENCOUNTER — Other Ambulatory Visit: Payer: Self-pay | Admitting: Nurse Practitioner

## 2019-04-09 MED ORDER — SERTRALINE HCL 50 MG PO TABS: 50.0000 mg | ORAL_TABLET | Freq: Every day | ORAL | 2 refills | Status: DC

## 2019-04-22 ENCOUNTER — Encounter: Payer: Self-pay | Admitting: Nurse Practitioner

## 2019-04-22 ENCOUNTER — Ambulatory Visit (INDEPENDENT_AMBULATORY_CARE_PROVIDER_SITE_OTHER): Payer: 59 | Admitting: Nurse Practitioner

## 2019-04-22 ENCOUNTER — Other Ambulatory Visit: Payer: Self-pay

## 2019-04-22 VITALS — BP 112/74 | HR 65 | Temp 98.7°F | Ht 64.5 in | Wt 182.8 lb

## 2019-04-22 DIAGNOSIS — F329 Major depressive disorder, single episode, unspecified: Secondary | ICD-10-CM | POA: Diagnosis not present

## 2019-04-22 DIAGNOSIS — Z1211 Encounter for screening for malignant neoplasm of colon: Secondary | ICD-10-CM | POA: Diagnosis not present

## 2019-04-22 DIAGNOSIS — F411 Generalized anxiety disorder: Secondary | ICD-10-CM

## 2019-04-22 DIAGNOSIS — W57XXXA Bitten or stung by nonvenomous insect and other nonvenomous arthropods, initial encounter: Secondary | ICD-10-CM | POA: Insufficient documentation

## 2019-04-22 DIAGNOSIS — F32A Depression, unspecified: Secondary | ICD-10-CM

## 2019-04-22 MED ORDER — DOXYCYCLINE HYCLATE 100 MG PO TABS: 100.0000 mg | ORAL_TABLET | Freq: Two times a day (BID) | ORAL | 0 refills | Status: AC

## 2019-04-22 NOTE — Assessment & Plan Note (Signed)
Chronic, improved with current medications.  Continue current medication regimen.

## 2019-04-22 NOTE — Progress Notes (Signed)
BP 112/74   Pulse 65   Temp 98.7 F (37.1 C) (Oral)   Ht 5' 4.5" (1.638 m)   Wt 182 lb 12.8 oz (82.9 kg)   LMP  (LMP Unknown)   SpO2 98%   BMI 30.89 kg/m    Subjective:    Patient ID: Leslie Turner, female    DOB: 1955/12/11, 63 y.o.   MRN: [id number]  HPI: Leslie Turner is a 63 y.o. female  Chief Complaint  Patient presents with  . Follow-up  . Anxiety    Sweating alot, wants to know if it is a side effect to increased dose.    ANXIETY/STRESS Sertraline 150 MG daily and Vistaril as needed.  Using Vistaril less, feels better on current Zoloft dose but noticed more sweating.  Discussed side effects of medication. Duration:stable Anxious mood: occasional, when her daughters visit and then go home  Excessive worrying: no Irritability: no  Sweating: no Nausea: no Palpitations:no Hyperventilation: no Panic attacks: occasional, but not as bad Agoraphobia: no  Obscessions/compulsions: no Depressed mood: no Depression screen [facility] 2/9 04/22/2019 03/18/2019 01/08/2019 12/18/2018 11/29/2018  Decreased Interest 1 0 1 1 0  Down, Depressed, Hopeless 1 0 1 2 1   PHQ - 2 Score 2 0 2 3 1   Altered sleeping 0 0 0 2 0  Tired, decreased energy 1 1 1 1  0  Change in appetite 1 2 2 2  0  Feeling bad or failure about yourself  0 1 0 0 0  Trouble concentrating 0 1 1 1  0  Moving slowly or fidgety/restless 0 0 0 0 0  Suicidal thoughts 0 0 0 0 0  PHQ-9 Score 4 5 6 9 1   Difficult doing work/chores Not difficult at all Not difficult at all - Somewhat difficult Not difficult at all  Some recent data might be hidden   Anhedonia: no Weight changes: no Insomnia:none Hypersomnia: no Fatigue/loss of energy: no Feelings of worthlessness: no Feelings of guilt: no Impaired concentration/indecisiveness: no Suicidal ideations: no  Crying spells: no Recent Stressors/Life Changes: no   Relationship problems: no   Family stress: no     Financial stress: no    Job stress: no    Recent  death/loss: no  GAD 7 : Generalized Anxiety Score 04/22/2019 03/18/2019 01/08/2019 12/18/2018  Nervous, Anxious, on Edge 1 2 1 1   Control/stop worrying 1 1 1 1   Worry too much - different things 1 1 1 2   Trouble relaxing 1 1 1 2   Restless 1 1 0 0  Easily annoyed or irritable 1 0 1 1  Afraid - awful might happen 0 2 0 0  Total GAD 7 Score 6 8 5 7   Anxiety Difficulty Somewhat difficult Not difficult at all - Somewhat difficult   TICK BITE: Has had multiple tick bites, one had remained present with erythema for 2 months.   Duration: 2 months Location: left lower back Onset: multiple, most irritating has been 2 months Itching: yes Status: stable Treatments attempted:  Fever: no, having increased sweats Chills: no headache: no Muscle pain: no Rash: redness around one on back  Relevant past medical, surgical, family and social history reviewed and updated as indicated. Interim medical history since our last visit reviewed. Allergies and medications reviewed and updated.  Review of Systems  Constitutional: Negative for activity change, appetite change, diaphoresis, fatigue and fever.  Respiratory: Negative for cough, chest tightness and shortness of breath.   Cardiovascular: Negative for chest pain, palpitations and leg  swelling.  Gastrointestinal: Negative for abdominal distention, abdominal pain, constipation, diarrhea, nausea and vomiting.  Endocrine: Negative for cold intolerance, heat intolerance, polydipsia, polyphagia and polyuria.  Musculoskeletal: Negative.   Skin: Positive for wound.  Neurological: Negative for dizziness, syncope, weakness, light-headedness, numbness and headaches.  Psychiatric/Behavioral: Negative for decreased concentration, self-injury, sleep disturbance and suicidal ideas. The patient is nervous/anxious (occasional, better).      Per HPI unless specifically indicated above     Objective:    BP 112/74   Pulse 65   Temp 98.7 F (37.1 C) (Oral)   Ht  5' 4.5" (1.638 m)   Wt 182 lb 12.8 oz (82.9 kg)   LMP  (LMP Unknown)   SpO2 98%   BMI 30.89 kg/m   Wt Readings from Last 3 Encounters:  04/22/19 182 lb 12.8 oz (82.9 kg)  03/18/19 184 lb (83.5 kg)  01/08/19 192 lb (87.1 kg)    Physical Exam Vitals signs and nursing note reviewed.  Constitutional:      General: She is awake. She is not in acute distress.    Appearance: She is well-developed. She is not ill-appearing.  HENT:     Head: Normocephalic.     Right Ear: Hearing normal.     Left Ear: Hearing normal.     Nose: Nose normal.     Mouth/Throat:     Mouth: Mucous membranes are moist.  Eyes:     General: Lids are normal.        Right eye: No discharge.        Left eye: No discharge.     Conjunctiva/sclera: Conjunctivae normal.     Pupils: Pupils are equal, round, and reactive to light.  Neck:     Musculoskeletal: Normal range of motion and neck supple.     Thyroid: No thyromegaly.     Vascular: No carotid bruit.  Cardiovascular:     Rate and Rhythm: Normal rate and regular rhythm.     Heart sounds: Normal heart sounds. No murmur. No gallop.   Pulmonary:     Effort: Pulmonary effort is normal. No accessory muscle usage or respiratory distress.     Breath sounds: Normal breath sounds.  Abdominal:     General: Bowel sounds are normal.     Palpations: Abdomen is soft. There is no hepatomegaly or splenomegaly.  Musculoskeletal:     Right lower leg: No edema.     Left lower leg: No edema.  Lymphadenopathy:     Cervical: No cervical adenopathy.  Skin:    General: Skin is warm and dry.       Neurological:     Mental Status: She is alert and oriented to person, place, and time.  Psychiatric:        Attention and Perception: Attention normal.        Mood and Affect: Mood normal.        Speech: Speech normal.        Behavior: Behavior normal. Behavior is cooperative.        Thought Content: Thought content normal.        Judgment: Judgment normal.     Results for  orders placed or performed in visit on 12/18/18  Thyroid Panel With TSH  Result Value Ref Range   TSH 1.960 0.450 - 4.500 uIU/mL   T4, Total 10.8 4.5 - 12.0 ug/dL   T3 Uptake Ratio 31 24 - 39 %   Free Thyroxine Index 3.3 1.2 - 4.9  Assessment & Plan:   Problem List Items Addressed This Visit      Musculoskeletal and Integument   Tick bite    One area with minimal improvement x 2 months per patient.  Obtain labs.  Script for Doxycycline sent to pharmacy.  Return for worsening or continued issues.  Discussed s/s to notify provider of.      Relevant Orders   B. burgdorfi antibodies   Lyme disease, western blot     Other   Chronic depression    Chronic, improved with current medications.  Continue current medication regimen.        Generalized anxiety disorder - Primary    Chronic, improving.  Continue current medication regimen.  Recommend therapy, but patient declines.  Return in 3 months for follow-up.       Other Visit Diagnoses    Colon cancer screening       Relevant Orders   Cologuard       Follow up plan: Return in about 3 months (around 07/23/2019) for Anxiety and Thyroid .

## 2019-04-22 NOTE — Assessment & Plan Note (Signed)
Chronic, improving.  Continue current medication regimen.  Recommend therapy, but patient declines.  Return in 3 months for follow-up.

## 2019-04-22 NOTE — Patient Instructions (Signed)

## 2019-04-22 NOTE — Assessment & Plan Note (Signed)
One area with minimal improvement x 2 months per patient.  Obtain labs.  Script for Doxycycline sent to pharmacy.  Return for worsening or continued issues.  Discussed s/s to notify provider of.

## 2019-04-26 LAB — LYME DISEASE, WESTERN BLOT
IgG P18 Ab.: ABSENT
IgG P23 Ab.: ABSENT
IgG P28 Ab.: ABSENT
IgG P30 Ab.: ABSENT
IgG P39 Ab.: ABSENT
IgG P41 Ab.: ABSENT
IgG P45 Ab.: ABSENT
IgG P58 Ab.: ABSENT
IgG P66 Ab.: ABSENT
IgG P93 Ab.: ABSENT
IgM P23 Ab.: ABSENT
IgM P39 Ab.: ABSENT
IgM P41 Ab.: ABSENT
Lyme IgG Wb: NEGATIVE
Lyme IgM Wb: NEGATIVE

## 2019-04-26 LAB — B. BURGDORFI ANTIBODIES: Lyme IgG/IgM Ab: <0.91 {ISR} (ref 0.00–0.90)

## 2019-05-14 LAB — COLOGUARD: Cologuard: NEGATIVE

## 2019-07-23 ENCOUNTER — Other Ambulatory Visit: Payer: Self-pay

## 2019-07-23 ENCOUNTER — Encounter: Payer: Self-pay | Admitting: Nurse Practitioner

## 2019-07-23 ENCOUNTER — Ambulatory Visit (INDEPENDENT_AMBULATORY_CARE_PROVIDER_SITE_OTHER): Payer: 59 | Admitting: Nurse Practitioner

## 2019-07-23 VITALS — BP 127/82 | HR 69 | Temp 98.3°F | Ht 64.5 in | Wt 179.0 lb

## 2019-07-23 DIAGNOSIS — F411 Generalized anxiety disorder: Secondary | ICD-10-CM | POA: Diagnosis not present

## 2019-07-23 DIAGNOSIS — E039 Hypothyroidism, unspecified: Secondary | ICD-10-CM | POA: Diagnosis not present

## 2019-07-23 DIAGNOSIS — E7800 Pure hypercholesterolemia, unspecified: Secondary | ICD-10-CM

## 2019-07-23 DIAGNOSIS — F32A Depression, unspecified: Secondary | ICD-10-CM

## 2019-07-23 DIAGNOSIS — I10 Essential (primary) hypertension: Secondary | ICD-10-CM | POA: Diagnosis not present

## 2019-07-23 DIAGNOSIS — Z23 Encounter for immunization: Secondary | ICD-10-CM

## 2019-07-23 DIAGNOSIS — F329 Major depressive disorder, single episode, unspecified: Secondary | ICD-10-CM | POA: Diagnosis not present

## 2019-07-23 NOTE — Assessment & Plan Note (Signed)
Chronic, stable.  Continue current medication regimen and adjust as needed.  TSH level today. 

## 2019-07-23 NOTE — Assessment & Plan Note (Signed)
Chronic, with improvement.  Denies SI/HI.  Continue current medication regimen and adjust as needed.

## 2019-07-23 NOTE — Assessment & Plan Note (Signed)
Chronic, stable.  Continue current medication regimen and adjust as needed.  Obtain CMP and lipid panel today. 

## 2019-07-23 NOTE — Patient Instructions (Signed)

## 2019-07-23 NOTE — Assessment & Plan Note (Signed)
Chronic, with improvement.  Continue Sertraline 150 MG and Vistaril as needed (using minimally).  Denies SI/HI.  Return to office in 6 months.

## 2019-07-23 NOTE — Progress Notes (Signed)
BP 127/82   Pulse 69   Temp 98.3 F (36.8 C) (Oral)   Ht 5' 4.5" (1.638 m)   Wt 179 lb (81.2 kg)   LMP  (LMP Unknown)   SpO2 96%   BMI 30.25 kg/m    Subjective:    Patient ID: Leslie Turner, female    DOB: November 04, 1955, 63 y.o.   MRN: [id number]  HPI: Leslie Turner is a 63 y.o. female  Chief Complaint  Patient presents with  . Anxiety  . Hypothyroidism   ANXIETY/STRESS Continues on Zoloft 150 MG daily and Vistaril as needed.  Taking Vistaril once a month. Duration:controlled Anxious mood: no  Excessive worrying: no Irritability: no  Sweating: no Nausea: no Palpitations:no Hyperventilation: no Panic attacks: no Agoraphobia: no  Obscessions/compulsions: no Depressed mood: no Depression screen [facility] 2/9 07/23/2019 04/22/2019 03/18/2019 01/08/2019 12/18/2018  Decreased Interest 0 1 0 1 1  Down, Depressed, Hopeless 0 1 0 1 2  PHQ - 2 Score 0 2 0 2 3  Altered sleeping 0 0 0 0 2  Tired, decreased energy 0 1 1 1 1   Change in appetite 0 1 2 2 2   Feeling bad or failure about yourself  0 0 1 0 0  Trouble concentrating 0 0 1 1 1   Moving slowly or fidgety/restless 0 0 0 0 0  Suicidal thoughts 0 0 0 0 0  PHQ-9 Score 0 4 5 6 9   Difficult doing work/chores Not difficult at all Not difficult at all Not difficult at all - Somewhat difficult  Some recent data might be hidden   Anhedonia: no Weight changes: no Insomnia: none Hypersomnia: no Fatigue/loss of energy: no Feelings of worthlessness: no Feelings of guilt: no Impaired concentration/indecisiveness: no Suicidal ideations: no  Crying spells: no Recent Stressors/Life Changes: no   Relationship problems: no   Family stress: no     Financial stress: no    Job stress: no    Recent death/loss: no  HYPOTHYROIDISM Continues on Levothyroxine 88 MCG. Thyroid control status:stable Satisfied with current treatment? yes Medication side effects: no Medication compliance: good compliance Etiology of hypothyroidism:   Recent dose adjustment:no Fatigue: yes Cold intolerance: no Heat intolerance: no Weight gain: no Weight loss: no Constipation: no Diarrhea/loose stools: no Palpitations: no Lower extremity edema: no Anxiety/depressed mood: no   HYPERTENSION / HYPERLIPIDEMIA Continues on HCTZ and Losartan + Lipitor. Satisfied with current treatment? yes Duration of hypertension: chronic BP monitoring frequency: not checking BP range:  BP medication side effects: no Duration of hyperlipidemia: chronic Cholesterol medication side effects: no Cholesterol supplements: none Medication compliance: good compliance Aspirin: no Recent stressors: no Recurrent headaches: no Visual changes: no Palpitations: no Dyspnea: no Chest pain: no Lower extremity edema: no Dizzy/lightheaded: no  Relevant past medical, surgical, family and social history reviewed and updated as indicated. Interim medical history since our last visit reviewed. Allergies and medications reviewed and updated.  Review of Systems  Constitutional: Negative for activity change, appetite change, diaphoresis, fatigue and fever.  Respiratory: Negative for cough, chest tightness and shortness of breath.   Cardiovascular: Negative for chest pain, palpitations and leg swelling.  Gastrointestinal: Negative for abdominal distention, abdominal pain, constipation, diarrhea, nausea and vomiting.  Endocrine: Negative for cold intolerance and heat intolerance.  Neurological: Negative for dizziness, syncope, weakness, light-headedness, numbness and headaches.  Psychiatric/Behavioral: Negative.     Per HPI unless specifically indicated above     Objective:    BP 127/82   Pulse 69  Temp 98.3 F (36.8 C) (Oral)   Ht 5' 4.5" (1.638 m)   Wt 179 lb (81.2 kg)   LMP  (LMP Unknown)   SpO2 96%   BMI 30.25 kg/m   Wt Readings from Last 3 Encounters:  07/23/19 179 lb (81.2 kg)  04/22/19 182 lb 12.8 oz (82.9 kg)  03/18/19 184 lb (83.5 kg)     Physical Exam Vitals signs and nursing note reviewed.  Constitutional:      General: She is awake. She is not in acute distress.    Appearance: She is well-developed and overweight. She is not ill-appearing.  HENT:     Head: Normocephalic.     Right Ear: Hearing normal.     Left Ear: Hearing normal.  Eyes:     General: Lids are normal.        Right eye: No discharge.        Left eye: No discharge.     Conjunctiva/sclera: Conjunctivae normal.     Pupils: Pupils are equal, round, and reactive to light.  Neck:     Musculoskeletal: Normal range of motion and neck supple.     Thyroid: No thyromegaly.     Vascular: No carotid bruit.  Cardiovascular:     Rate and Rhythm: Normal rate and regular rhythm.     Heart sounds: Normal heart sounds. No murmur. No gallop.   Pulmonary:     Effort: Pulmonary effort is normal. No accessory muscle usage or respiratory distress.     Breath sounds: Normal breath sounds.  Abdominal:     General: Bowel sounds are normal.     Palpations: Abdomen is soft.  Musculoskeletal:     Right lower leg: No edema.     Left lower leg: No edema.  Skin:    General: Skin is warm and dry.  Neurological:     Mental Status: She is alert and oriented to person, place, and time.  Psychiatric:        Attention and Perception: Attention normal.        Mood and Affect: Mood normal.        Behavior: Behavior normal. Behavior is cooperative.        Thought Content: Thought content normal.        Judgment: Judgment normal.     Results for orders placed or performed in visit on 05/15/19  Cologuard  Result Value Ref Range   Cologuard Negative Negative      Assessment & Plan:   Problem List Items Addressed This Visit      Cardiovascular and Mediastinum   Essential hypertension, benign    Chronic, stable with BP below goal.  Continue current medication regimen and adjust as needed.  Obtain CMP today.  Recommend checking BP at home three days a week.      Relevant  Orders   Comprehensive metabolic panel     Endocrine   Hypothyroidism    Chronic, stable.  Continue current medication regimen and adjust as needed.  TSH level today.      Relevant Orders   Thyroid Panel With TSH     Other   Chronic depression    Chronic, with improvement.  Denies SI/HI.  Continue current medication regimen and adjust as needed.      Generalized anxiety disorder - Primary    Chronic, with improvement.  Continue Sertraline 150 MG and Vistaril as needed (using minimally).  Denies SI/HI.  Return to office in 6 months.  Hyperlipidemia    Chronic, stable.  Continue current medication regimen and adjust as needed.  Obtain CMP and lipid panel today.      Relevant Orders   Comprehensive metabolic panel   Lipid Panel w/o Chol/HDL Ratio    Other Visit Diagnoses    Flu vaccine need       Relevant Orders   Flu Vaccine QUAD 36+ mos IM (Completed)       Follow up plan: Return in about 6 months (around 01/20/2020) for Hypothyroid, HTN/HLD, Mood, Vit D.

## 2019-07-23 NOTE — Assessment & Plan Note (Signed)
Chronic, stable with BP below goal.  Continue current medication regimen and adjust as needed.  Obtain CMP today.  Recommend checking BP at home three days a week.

## 2019-07-24 LAB — COMPREHENSIVE METABOLIC PANEL
ALT: 74 IU/L — ABNORMAL HIGH (ref 0–32)
AST: 60 IU/L — ABNORMAL HIGH (ref 0–40)
Albumin/Globulin Ratio: 1.9 (ref 1.2–2.2)
Albumin: 4.2 g/dL (ref 3.8–4.8)
Alkaline Phosphatase: 92 IU/L (ref 39–117)
BUN/Creatinine Ratio: 17 (ref 12–28)
BUN: 15 mg/dL (ref 8–27)
Bilirubin Total: 0.5 mg/dL (ref 0.0–1.2)
CO2: 28 mmol/L (ref 20–29)
Calcium: 9.4 mg/dL (ref 8.7–10.3)
Chloride: 99 mmol/L (ref 96–106)
Creatinine, Ser: 0.9 mg/dL (ref 0.57–1.00)
GFR calc Af Amer: 79 mL/min/{1.73_m2} (ref >59)
GFR calc non Af Amer: 69 mL/min/{1.73_m2} (ref >59)
Globulin, Total: 2.2 g/dL (ref 1.5–4.5)
Glucose: 97 mg/dL (ref 65–99)
Potassium: 3.1 mmol/L — ABNORMAL LOW (ref 3.5–5.2)
Sodium: 140 mmol/L (ref 134–144)
Total Protein: 6.4 g/dL (ref 6.0–8.5)

## 2019-07-24 LAB — THYROID PANEL WITH TSH
Free Thyroxine Index: 2.5 (ref 1.2–4.9)
T3 Uptake Ratio: 26 % (ref 24–39)
T4, Total: 9.5 ug/dL (ref 4.5–12.0)
TSH: 2.76 u[IU]/mL (ref 0.450–4.500)

## 2019-07-24 LAB — LIPID PANEL W/O CHOL/HDL RATIO
Cholesterol, Total: 170 mg/dL (ref 100–199)
HDL: 55 mg/dL (ref >39)
LDL Chol Calc (NIH): 96 mg/dL (ref 0–99)
Triglycerides: 107 mg/dL (ref 0–149)
VLDL Cholesterol Cal: 19 mg/dL (ref 5–40)

## 2019-08-14 ENCOUNTER — Other Ambulatory Visit: Payer: Self-pay | Admitting: Nurse Practitioner

## 2019-08-14 ENCOUNTER — Telehealth: Payer: Self-pay

## 2019-08-14 DIAGNOSIS — E876 Hypokalemia: Secondary | ICD-10-CM

## 2019-08-14 DIAGNOSIS — R1084 Generalized abdominal pain: Secondary | ICD-10-CM

## 2019-08-14 NOTE — Progress Notes (Signed)
Future lab orders

## 2019-08-14 NOTE — Telephone Encounter (Signed)
Called patient to do script screening for [facility] lab appointment. She states she just wanted to mention something to [facility]. She states she has had a dull achy in her ovary area on both sides for a while. She states she doesn't think its her bladder or anything like that and that it may just be "old age". She states she is coming in for blood work and didn't know if Jolene wanted to add a urine or any blood work on because of this issue she has been having.

## 2019-08-14 NOTE — Telephone Encounter (Signed)
Added on urine and wet prep with her CMP for tomorrow.  Thank you.

## 2019-08-14 NOTE — Telephone Encounter (Signed)
Patient notified

## 2019-08-15 ENCOUNTER — Other Ambulatory Visit: Payer: 59

## 2019-08-15 ENCOUNTER — Other Ambulatory Visit: Payer: Self-pay

## 2019-08-15 ENCOUNTER — Ambulatory Visit: Payer: 59 | Admitting: Nurse Practitioner

## 2019-08-15 DIAGNOSIS — R1084 Generalized abdominal pain: Secondary | ICD-10-CM

## 2019-08-15 DIAGNOSIS — E876 Hypokalemia: Secondary | ICD-10-CM

## 2019-08-15 LAB — MICROSCOPIC EXAMINATION

## 2019-08-15 LAB — UA/M W/RFLX CULTURE, ROUTINE
Bilirubin, UA: NEGATIVE
Glucose, UA: NEGATIVE
Leukocytes,UA: NEGATIVE
Nitrite, UA: NEGATIVE
Protein,UA: NEGATIVE
Specific Gravity, UA: 1.02 (ref 1.005–1.030)
Urobilinogen, Ur: 0.2 mg/dL (ref 0.2–1.0)
pH, UA: 7 (ref 5.0–7.5)

## 2019-08-15 LAB — WET PREP FOR TRICH, YEAST, CLUE
Clue Cell Exam: NEGATIVE
Trichomonas Exam: NEGATIVE
Yeast Exam: NEGATIVE

## 2019-08-16 LAB — COMPREHENSIVE METABOLIC PANEL
ALT: 89 IU/L — ABNORMAL HIGH (ref 0–32)
AST: 79 IU/L — ABNORMAL HIGH (ref 0–40)
Albumin/Globulin Ratio: 1.7 (ref 1.2–2.2)
Albumin: 4.2 g/dL (ref 3.8–4.8)
Alkaline Phosphatase: 94 IU/L (ref 39–117)
BUN/Creatinine Ratio: 13 (ref 12–28)
BUN: 12 mg/dL (ref 8–27)
Bilirubin Total: 0.6 mg/dL (ref 0.0–1.2)
CO2: 25 mmol/L (ref 20–29)
Calcium: 9.1 mg/dL (ref 8.7–10.3)
Chloride: 101 mmol/L (ref 96–106)
Creatinine, Ser: 0.96 mg/dL (ref 0.57–1.00)
GFR calc Af Amer: 73 mL/min/{1.73_m2} (ref >59)
GFR calc non Af Amer: 64 mL/min/{1.73_m2} (ref >59)
Globulin, Total: 2.5 g/dL (ref 1.5–4.5)
Glucose: 88 mg/dL (ref 65–99)
Potassium: 3.8 mmol/L (ref 3.5–5.2)
Sodium: 140 mmol/L (ref 134–144)
Total Protein: 6.7 g/dL (ref 6.0–8.5)

## 2019-08-18 ENCOUNTER — Other Ambulatory Visit: Payer: Self-pay | Admitting: Nurse Practitioner

## 2019-08-18 DIAGNOSIS — R7989 Other specified abnormal findings of blood chemistry: Secondary | ICD-10-CM

## 2019-09-02 ENCOUNTER — Other Ambulatory Visit: Payer: Self-pay | Admitting: Nurse Practitioner

## 2019-09-15 ENCOUNTER — Other Ambulatory Visit: Payer: 59

## 2019-09-15 ENCOUNTER — Other Ambulatory Visit: Payer: Self-pay

## 2019-09-15 DIAGNOSIS — R7989 Other specified abnormal findings of blood chemistry: Secondary | ICD-10-CM

## 2019-09-16 LAB — COMPREHENSIVE METABOLIC PANEL
ALT: 92 IU/L — ABNORMAL HIGH (ref 0–32)
AST: 77 IU/L — ABNORMAL HIGH (ref 0–40)
Albumin/Globulin Ratio: 2 (ref 1.2–2.2)
Albumin: 4.3 g/dL (ref 3.8–4.8)
Alkaline Phosphatase: 97 IU/L (ref 39–117)
BUN/Creatinine Ratio: 12 (ref 12–28)
BUN: 11 mg/dL (ref 8–27)
Bilirubin Total: 0.6 mg/dL (ref 0.0–1.2)
CO2: 26 mmol/L (ref 20–29)
Calcium: 9.2 mg/dL (ref 8.7–10.3)
Chloride: 102 mmol/L (ref 96–106)
Creatinine, Ser: 0.92 mg/dL (ref 0.57–1.00)
GFR calc Af Amer: 77 mL/min/{1.73_m2} (ref >59)
GFR calc non Af Amer: 66 mL/min/{1.73_m2} (ref >59)
Globulin, Total: 2.1 g/dL (ref 1.5–4.5)
Glucose: 89 mg/dL (ref 65–99)
Potassium: 3.7 mmol/L (ref 3.5–5.2)
Sodium: 141 mmol/L (ref 134–144)
Total Protein: 6.4 g/dL (ref 6.0–8.5)

## 2019-09-30 ENCOUNTER — Other Ambulatory Visit: Payer: Self-pay | Admitting: Nurse Practitioner

## 2019-09-30 DIAGNOSIS — I10 Essential (primary) hypertension: Secondary | ICD-10-CM

## 2019-12-11 ENCOUNTER — Encounter: Payer: Self-pay | Admitting: Nurse Practitioner

## 2019-12-17 LAB — HM MAMMOGRAPHY

## 2019-12-19 ENCOUNTER — Encounter: Payer: Self-pay | Admitting: Nurse Practitioner

## 2019-12-23 ENCOUNTER — Other Ambulatory Visit: Payer: Self-pay

## 2019-12-23 ENCOUNTER — Ambulatory Visit (INDEPENDENT_AMBULATORY_CARE_PROVIDER_SITE_OTHER): Payer: 59

## 2019-12-23 ENCOUNTER — Ambulatory Visit (INDEPENDENT_AMBULATORY_CARE_PROVIDER_SITE_OTHER): Payer: 59 | Admitting: Podiatry

## 2019-12-23 ENCOUNTER — Other Ambulatory Visit: Payer: Self-pay | Admitting: Podiatry

## 2019-12-23 DIAGNOSIS — M778 Other enthesopathies, not elsewhere classified: Secondary | ICD-10-CM | POA: Diagnosis not present

## 2019-12-23 DIAGNOSIS — L989 Disorder of the skin and subcutaneous tissue, unspecified: Secondary | ICD-10-CM | POA: Diagnosis not present

## 2019-12-23 DIAGNOSIS — M79672 Pain in left foot: Secondary | ICD-10-CM | POA: Diagnosis not present

## 2019-12-24 ENCOUNTER — Encounter: Payer: Self-pay | Admitting: Podiatry

## 2019-12-24 NOTE — Progress Notes (Signed)
Subjective:  Patient ID: Leslie Turner, female    DOB: 1955/11/27,  MRN: [id number]  Chief Complaint  Patient presents with  . Foot Pain    pt is here for foot pain, possible callus of the left plantar forefoot, pt states that it is painful to walk on, and states that it has been going on for 3 months. Pt puts pain scale as a 5 out of 42    64 y.o. female presents with the above complaint.  Patient presents with left submetatarsal 2 pain with hyperkeratotic lesion.  Patient states that this has gotten progressively worse with time.  Has been going on for about 3 months.  It is painful to walk on.  She ambulates with regular sneakers.  Her pain scale is 5 out of 10.  She denies any other acute complaints.  She would like to know if there is anything that could be done to help address this.   Review of Systems: Negative except as noted in the HPI. Denies N/V/F/Ch.  Past Medical History:  Diagnosis Date  . Anxiety   . Carotid stenosis   . Depression   . GERD (gastroesophageal reflux disease)   . Hyperlipidemia   . Hypertension   . Metabolic syndrome   . Microscopic hematuria   . Pain in knee   . Thyroid disease    hypothyroid  . Vitamin D deficiency     Current Outpatient Medications:  .  atorvastatin (LIPITOR) 40 MG tablet, TAKE 1 TABLET BY MOUTH  DAILY, Disp: 90 tablet, Rfl: 1 .  cholecalciferol (VITAMIN D) 1000 UNITS tablet, Take 1,000 Units by mouth daily., Disp: , Rfl:  .  hydrochlorothiazide (HYDRODIURIL) 25 MG tablet, TAKE 1 TABLET BY MOUTH  DAILY, Disp: 90 tablet, Rfl: 1 .  hydrOXYzine (ATARAX/VISTARIL) 25 MG tablet, TAKE 1 TABLET (25 MG TOTAL) BY MOUTH 2 (TWO) TIMES DAILY AS NEEDED., Disp: 30 tablet, Rfl: 0 .  levothyroxine (SYNTHROID) 112 MCG tablet, TAKE 1 TABLET BY MOUTH  DAILY BEFORE BREAKFAST, Disp: 90 tablet, Rfl: 3 .  levothyroxine (SYNTHROID, LEVOTHROID) 88 MCG tablet, Take 1 tablet (88 mcg total) by mouth daily., Disp: 90 tablet, Rfl: 3 .  losartan (COZAAR) 50  MG tablet, TAKE 1 TABLET BY MOUTH  DAILY, Disp: 90 tablet, Rfl: 1 .  Multiple Vitamins-Minerals (CENTRUM SILVER 50+WOMEN PO), Take 1 tablet by mouth daily., Disp: , Rfl:  .  sertraline (ZOLOFT) 100 MG tablet, TAKE 1 TABLET BY MOUTH  DAILY, Disp: 90 tablet, Rfl: 3 .  sertraline (ZOLOFT) 50 MG tablet, Take 1 tablet (50 mg total) by mouth daily., Disp: 90 tablet, Rfl: 2 .  omeprazole (PRILOSEC) 20 MG capsule, TAKE 1 CAPSULE BY MOUTH EVERY DAY, Disp: 90 capsule, Rfl: 1  Social History   Tobacco Use  Smoking Status Former Smoker  Smokeless Tobacco Never Used  Tobacco Comment   smoked for 12-14 years and quit cold [place]    No Known Allergies Objective:  There were no vitals filed for this visit. There is no height or weight on file to calculate BMI. Constitutional Well developed. Well nourished.  Vascular Dorsalis pedis pulses palpable bilaterally. Posterior tibial pulses palpable bilaterally. Capillary refill normal to all digits.  No cyanosis or clubbing noted. Pedal hair growth normal.  Neurologic Normal speech. Oriented to person, place, and time. Epicritic sensation to light touch grossly present bilaterally.  Dermatologic Nails well groomed and normal in appearance. No open wounds. No skin lesions.  Orthopedic:  Pain on palpation to  the hyperkeratotic skin lesion submetatarsal 2.  Upon debridement no pinpoint bleeding noted.  No Mulder's click noted.  No capsulitis or intra-articular second metatarsophalangeal joint of third metatarsophalangeal joint pain noted.   Radiographs: 3 views of skeletally mature adult foot mild arthritic changes noted at the first metatarsophalangeal joint.  Heel spur plantar and posterior noted.  Metatarsal parabola with elongated first metatarsal noted.  Assessment:   1. Foot pain, left   2. Benign skin lesion    Plan:  Patient was evaluated and treated and all questions answered.  Left foot benign skin lesion -I explained to the patient the  etiology of the skin lesion/hyperkeratotic lesion and various treatment options associated with it.  Given her radiographic finding with metatarsal parabola of first and second being in the same length this could lightly overload the second metatarsal head and therefore cause excessive pressure to the plantar aspect of the second met head when ambulating.  I believe patient will benefit from a debridement of the lesion and metatarsal pad to offload it. -I will discuss with the patient that if this helps then we will consider doing orthotics with second metatarsal offloading during next visit.  No follow-ups on file.

## 2019-12-26 ENCOUNTER — Encounter: Payer: Self-pay | Admitting: Nurse Practitioner

## 2019-12-26 ENCOUNTER — Other Ambulatory Visit: Payer: Self-pay

## 2019-12-26 ENCOUNTER — Ambulatory Visit (INDEPENDENT_AMBULATORY_CARE_PROVIDER_SITE_OTHER): Payer: 59 | Admitting: Nurse Practitioner

## 2019-12-26 DIAGNOSIS — M170 Bilateral primary osteoarthritis of knee: Secondary | ICD-10-CM | POA: Diagnosis not present

## 2019-12-26 MED ORDER — TRIAMCINOLONE ACETONIDE 40 MG/ML IJ SUSP
40.0000 mg | Freq: Once | INTRAMUSCULAR | Status: AC
  Administered 2019-12-26: 40 mg via INTRAMUSCULAR

## 2019-12-26 NOTE — Progress Notes (Signed)
BP 120/80   Pulse 70   Temp 98.5 F (36.9 C) (Oral)   LMP  (LMP Unknown)   SpO2 98%    Subjective:    Patient ID: Leslie Turner, female    DOB: 30-Dec-1955, 64 y.o.   MRN: [id number]  HPI: Leslie Turner is a 64 y.o. female  Chief Complaint  Patient presents with  . Knee Pain   KNEE PAIN Saw ortho at [facility] in 2017 and 2019.  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  Currently her right knee is causing the most issues.  Has had two injections to right knee in past, 2017 and 2019.   Duration: chronic Involved knee: right Mechanism of injury: unknown Location:anterior Onset: gradual Severity: 8/10  Quality:  sharp, aching and throbbing Frequency: intermittent Radiation: no Aggravating factors: walking, bending and movement  Alleviating factors: Cush cream -- hemp cream  Status: stable Treatments attempted: Cush Cream  Relief with NSAIDs?:  No NSAIDs Taken Weakness with weight bearing or walking: no Sensation of giving way: no Locking: no Popping: no Bruising: no Swelling: no Redness: no Paresthesias/decreased sensation: no Fevers: no  Relevant past medical, surgical, family and social history reviewed and updated as indicated. Interim medical history since our last visit reviewed. Allergies and medications reviewed and updated.  Review of Systems  Constitutional: Negative for activity change, appetite change, diaphoresis, fatigue and fever.  Respiratory: Negative for cough, chest tightness and shortness of breath.   Cardiovascular: Negative for chest pain, palpitations and leg swelling.  Musculoskeletal: Positive for arthralgias.  Psychiatric/Behavioral: Negative.     Per HPI unless specifically indicated above     Objective:    BP 120/80   Pulse 70   Temp 98.5 F (36.9 C) (Oral)   LMP  (LMP Unknown)   SpO2 98%   Wt Readings from Last 3 Encounters:  07/23/19 179 lb (81.2 kg)    04/22/19 182 lb 12.8 oz (82.9 kg)  03/18/19 184 lb (83.5 kg)    Physical Exam Vitals and nursing note reviewed.  Constitutional:      General: She is awake. She is not in acute distress.    Appearance: She is well-developed and well-groomed. She is not ill-appearing.  HENT:     Head: Normocephalic.     Right Ear: Hearing normal.     Left Ear: Hearing normal.  Eyes:     General: Lids are normal.        Right eye: No discharge.        Left eye: No discharge.     Conjunctiva/sclera: Conjunctivae normal.     Pupils: Pupils are equal, round, and reactive to light.  Cardiovascular:     Rate and Rhythm: Normal rate and regular rhythm.     Heart sounds: Normal heart sounds. No murmur. No gallop.   Pulmonary:     Effort: Pulmonary effort is normal. No accessory muscle usage or respiratory distress.     Breath sounds: Normal breath sounds.  Abdominal:     General: Bowel sounds are normal.     Palpations: Abdomen is soft.  Musculoskeletal:     Cervical back: Normal range of motion and neck supple.     Right knee: Crepitus present. No swelling, effusion, erythema, ecchymosis or lacerations. Normal range of motion. No tenderness. Normal alignment, normal meniscus and normal patellar mobility.     Left knee: Crepitus present. No swelling, effusion, erythema, ecchymosis  or lacerations. Normal range of motion. No tenderness. Normal alignment, normal meniscus and normal patellar mobility.     Right lower leg: No edema.     Left lower leg: No edema.  Skin:    General: Skin is warm and dry.  Neurological:     Mental Status: She is alert and oriented to person, place, and time.  Psychiatric:        Attention and Perception: Attention normal.        Mood and Affect: Mood normal.        Speech: Speech normal.        Behavior: Behavior normal. Behavior is cooperative.        Thought Content: Thought content normal.    STEROID INJECTION Procedure: Right Knee Intraarticular Steroid Injection    Description: After verbal consent and patient education on injection,  area prepped and draped using semi-sterile technique. Using a anterior  approach, a mixture of 4 cc of  1% Marcaine & 1 cc of Kenalog 40 was injected into knee joint.  A bandage was then placed over the injection site. Complications:  none Post Procedure Instructions: To the ER if any symptoms of erythema or swelling.   Follow Up: PRN  Results for orders placed or performed in visit on 09/15/19  Comp Met (CMET)  Result Value Ref Range   Glucose 89 65 - 99 mg/dL   BUN 11 8 - 27 mg/dL   Creatinine, Ser 0.92 0.57 - 1.00 mg/dL   GFR calc non Af Amer 66 >59 mL/min/1.73   GFR calc Af Amer 77 >59 mL/min/1.73   BUN/Creatinine Ratio 12 12 - 28   Sodium 141 134 - 144 mmol/L   Potassium 3.7 3.5 - 5.2 mmol/L   Chloride 102 96 - 106 mmol/L   CO2 26 20 - 29 mmol/L   Calcium 9.2 8.7 - 10.3 mg/dL   Total Protein 6.4 6.0 - 8.5 g/dL   Albumin 4.3 3.8 - 4.8 g/dL   Globulin, Total 2.1 1.5 - 4.5 g/dL   Albumin/Globulin Ratio 2.0 1.2 - 2.2   Bilirubin Total 0.6 0.0 - 1.2 mg/dL   Alkaline Phosphatase 97 39 - 117 IU/L   AST 77 (H) 0 - 40 IU/L   ALT 92 (H) 0 - 32 IU/L      Assessment & Plan:   Problem List Items Addressed This Visit      Musculoskeletal and Integument   Primary osteoarthritis of both knees    Chronic, ongoing.  Has received steroid injections to right knee in past with ortho and is very knowledgeable of procedure and risks/benefits, further discussed today.  Verbal consent given and injection performed to right knee.  Recommend she ice area every couple hours for 24 hours.  Continue simple treatment at home, OTC creams.  Recommend focus on exercise regimen and modest weight loss.  Return in 6 weeks and will perform injection to left knee if needed.  Consider return to ortho or physical if ongoing pain.          Follow up plan: Return in about 6 weeks (around 02/06/2020) for Left knee injection. 

## 2019-12-26 NOTE — Assessment & Plan Note (Addendum)
Chronic, ongoing.  Has received steroid injections to right knee in past with ortho and is very knowledgeable of procedure and risks/benefits, further discussed today.  Verbal consent given and injection performed to right knee.  Recommend she ice area every couple hours for 24 hours.  Continue simple treatment at home, OTC creams.  Recommend focus on exercise regimen and modest weight loss.  Return in 6 weeks and will perform injection to left knee if needed.  Consider return to ortho or physical if ongoing pain. 

## 2019-12-26 NOTE — Addendum Note (Signed)
Addended by: Georgina Peer on: 12/26/2019 11:17 AM   Modules accepted: Orders

## 2019-12-26 NOTE — Patient Instructions (Signed)
Knee Injection A knee injection is a procedure to get medicine into your knee joint to relieve the pain, swelling, and stiffness of arthritis. Your health care provider uses a needle to inject medicine, which may also help to lubricate and cushion your knee joint. You may need more than one injection. Tell a health care provider about:  Any allergies you have.  All medicines you are taking, including vitamins, herbs, eye drops, creams, and over-the-counter medicines.  Any problems you or family members have had with anesthetic medicines.  Any blood disorders you have.  Any surgeries you have had.  Any medical conditions you have.  Whether you are pregnant or may be pregnant. What are the risks? Generally, this is a safe procedure. However, problems may occur, including:  Infection.  Bleeding.  Symptoms that get worse.  Damage to the area around your knee.  Allergic reaction to any of the medicines.  Skin reactions from repeated injections. What happens before the procedure?  Ask your health care provider about changing or stopping your regular medicines. This is especially important if you are taking diabetes medicines or blood thinners.  Plan to have someone take you home from the hospital or clinic. What happens during the procedure?   You will sit or lie down in a position for your knee to be treated.  The skin over your kneecap will be cleaned with a germ-killing soap.  You will be given a medicine that numbs the area (local anesthetic). You may feel some stinging.  The medicine will be injected into your knee. The needle is carefully placed between your kneecap and your knee. The medicine is injected into the joint space.  The needle will be removed at the end of the procedure.  A bandage (dressing) may be placed over the injection site. The procedure may vary among health care providers and hospitals. What can I expect after the procedure?  Your blood  pressure, heart rate, breathing rate, and blood oxygen level will be monitored until you leave the hospital or clinic.  You may have to move your knee through its full range of motion. This helps to get all the medicine into your joint space.  You will be watched to make sure that you do not have a reaction to the injected medicine.  You may feel more pain, swelling, and warmth than you did before the injection. This reaction may last about 1-2 days. Follow these instructions at home: Medicines  Take over-the-counter and prescription medicines only as told by your doctor.  Do not drive or use heavy machinery while taking prescription pain medicine.  Do not take medicines such as aspirin and ibuprofen unless your health care provider tells you to take them. Injection site care  Follow instructions from your health care provider about: ? How to take care of your puncture site. ? When and how you should change your dressing. ? When you should remove your dressing.  Check your injection area every day for signs of infection. Check for: ? More redness, swelling, or pain after 2 days. ? Fluid or blood. ? Pus or a bad smell. ? Warmth. Managing pain, stiffness, and swelling   If directed, put ice on the injection area: ? Put ice in a plastic bag. ? Place a towel between your skin and the bag. ? Leave the ice on for 20 minutes, 2-3 times per day.  Do not apply heat to your knee.  Raise (elevate) the injection area above the level   of your heart while you are sitting or lying down. General instructions  If you were given a dressing, keep it dry until your health care provider says it can be removed. Ask your health care provider when you can start showering or taking a bath.  Avoid strenuous activities for as long as directed by your health care provider. Ask your health care provider when you can return to your normal activities.  Keep all follow-up visits as told by your health  care provider. This is important. You may need more injections. Contact a health care provider if you have:  A fever.  Warmth in your injection area.  Fluid, blood, or pus coming from your injection site.  Symptoms at your injection site that last longer than 2 days after your procedure. Get help right away if:  Your knee: ? Turns very red. ? Becomes very swollen. ? Is in severe pain. Summary  A knee injection is a procedure to get medicine into your knee joint to relieve the pain, swelling, and stiffness of arthritis.  A needle is carefully placed between your kneecap and your knee to inject medicine into the joint space.  Before the procedure, ask your health care provider about changing or stopping your regular medicines, especially if you are taking diabetes medicines or blood thinners.  Contact your health care provider if you have any problems or questions after your procedure. This information is not intended to replace advice given to you by your health care provider. Make sure you discuss any questions you have with your health care provider. Document Revised: 11/05/2017 Document Reviewed: 11/05/2017 Elsevier Patient Education  2020 Elsevier Inc.  

## 2020-01-17 ENCOUNTER — Other Ambulatory Visit: Payer: Self-pay

## 2020-01-17 ENCOUNTER — Ambulatory Visit: Payer: Self-pay | Attending: Internal Medicine

## 2020-01-17 DIAGNOSIS — Z23 Encounter for immunization: Secondary | ICD-10-CM

## 2020-01-17 NOTE — Progress Notes (Signed)
   Covid-19 Vaccination Clinic  Name:  Leslie Turner    MRN: [id number] DOB: [birth year]  01/17/2020  [name] was observed post Covid-19 immunization for 15 minutes without incident. She was provided with Vaccine Information Sheet and instruction to access the V-Safe system.   [name] was instructed to call 911 with any severe reactions post vaccine: Marland Kitchen Difficulty breathing  . Swelling of face and throat  . A fast heartbeat  . A bad rash all over body  . Dizziness and weakness   Immunizations Administered    Name Date Dose VIS Date Route   Pfizer COVID-19 Vaccine 01/17/2020  9:24 AM 0.3 mL 10/10/2019 Intramuscular   Manufacturer: [place]   Lot: [id number]   [place]: [id number]

## 2020-01-20 ENCOUNTER — Ambulatory Visit: Payer: 59 | Admitting: Nurse Practitioner

## 2020-01-23 ENCOUNTER — Encounter: Payer: Self-pay | Admitting: Nurse Practitioner

## 2020-01-23 ENCOUNTER — Ambulatory Visit (INDEPENDENT_AMBULATORY_CARE_PROVIDER_SITE_OTHER): Payer: 59 | Admitting: Nurse Practitioner

## 2020-01-23 VITALS — BP 112/69 | HR 57 | Temp 98.5°F

## 2020-01-23 DIAGNOSIS — F411 Generalized anxiety disorder: Secondary | ICD-10-CM | POA: Diagnosis not present

## 2020-01-23 DIAGNOSIS — F32A Depression, unspecified: Secondary | ICD-10-CM

## 2020-01-23 DIAGNOSIS — F329 Major depressive disorder, single episode, unspecified: Secondary | ICD-10-CM | POA: Diagnosis not present

## 2020-01-23 DIAGNOSIS — E7800 Pure hypercholesterolemia, unspecified: Secondary | ICD-10-CM

## 2020-01-23 DIAGNOSIS — I10 Essential (primary) hypertension: Secondary | ICD-10-CM

## 2020-01-23 DIAGNOSIS — E039 Hypothyroidism, unspecified: Secondary | ICD-10-CM

## 2020-01-23 DIAGNOSIS — E559 Vitamin D deficiency, unspecified: Secondary | ICD-10-CM

## 2020-01-23 MED ORDER — SERTRALINE HCL 50 MG PO TABS: 25.0000 mg | ORAL_TABLET | Freq: Every day | ORAL | 3 refills | Status: DC

## 2020-01-23 MED ORDER — HYDROCHLOROTHIAZIDE 25 MG PO TABS: 12.5000 mg | ORAL_TABLET | Freq: Every day | ORAL | 3 refills | Status: DC

## 2020-01-23 NOTE — Assessment & Plan Note (Signed)
Chronic, stable with reduction in dose.  Denies SI/HI.  Continue current medication regimen and adjust as needed, continue to reduce as tolerated.  Check LFTs today.   ?

## 2020-01-23 NOTE — Assessment & Plan Note (Signed)
Chronic, ongoing.  Continue daily supplement and check Vit D level today. ?

## 2020-01-23 NOTE — Assessment & Plan Note (Signed)
Chronic, stable.  Continue current medication regimen and adjust as needed.  TSH level today.

## 2020-01-23 NOTE — Assessment & Plan Note (Signed)
Chronic, with improvement, she has been reducing Sertraline dose on own as tolerated.  Continue Sertraline 125 MG and Vistaril as needed (using very minimally).  Denies SI/HI.  Return to office in 6 months.

## 2020-01-23 NOTE — Assessment & Plan Note (Signed)
Chronic, stable.  Continue current medication regimen and adjust as needed.  Obtain CMP and lipid panel today. 

## 2020-01-23 NOTE — Patient Instructions (Signed)
Hypothyroidism  Hypothyroidism is when the thyroid gland does not make enough of certain hormones (it is underactive). The thyroid gland is a small gland located in the lower front part of the neck, just in front of the windpipe (trachea). This gland makes hormones that help control how the body uses food for energy (metabolism) as well as how the heart and brain function. These hormones also play a role in keeping your bones strong. When the thyroid is underactive, it produces too little of the hormones thyroxine (T4) and triiodothyronine (T3). What are the causes? This condition may be caused by:  Hashimoto's disease. This is a disease in which the body's disease-fighting system (immune system) attacks the thyroid gland. This is the most common cause.  Viral infections.  Pregnancy.  Certain medicines.  Birth defects.  Past radiation treatments to the head or neck for cancer.  Past treatment with radioactive iodine.  Past exposure to radiation in the environment.  Past surgical removal of part or all of the thyroid.  Problems with a gland in the center of the brain (pituitary gland).  Lack of enough iodine in the diet. What increases the risk? You are more likely to develop this condition if:  You are female.  You have a family history of thyroid conditions.  You use a medicine called lithium.  You take medicines that affect the immune system (immunosuppressants). What are the signs or symptoms? Symptoms of this condition include:  Feeling as though you have no energy (lethargy).  Not being able to tolerate cold.  Weight gain that is not explained by a change in diet or exercise habits.  Lack of appetite.  Dry skin.  Coarse hair.  Menstrual irregularity.  Slowing of thought processes.  Constipation.  Sadness or depression. How is this diagnosed? This condition may be diagnosed based on:  Your symptoms, your medical history, and a physical exam.  Blood  tests. You may also have imaging tests, such as an ultrasound or MRI. How is this treated? This condition is treated with medicine that replaces the thyroid hormones that your body does not make. After you begin treatment, it may take several weeks for symptoms to go away. Follow these instructions at home:  Take over-the-counter and prescription medicines only as told by your health care provider.  If you start taking any new medicines, tell your health care provider.  Keep all follow-up visits as told by your health care provider. This is important. ? As your condition improves, your dosage of thyroid hormone medicine may change. ? You will need to have blood tests regularly so that your health care provider can monitor your condition. Contact a health care provider if:  Your symptoms do not get better with treatment.  You are taking thyroid replacement medicine and you: ? Sweat a lot. ? Have tremors. ? Feel anxious. ? Lose weight rapidly. ? Cannot tolerate heat. ? Have emotional swings. ? Have diarrhea. ? Feel weak. Get help right away if you have:  Chest pain.  An irregular heartbeat.  A rapid heartbeat.  Difficulty breathing. Summary  Hypothyroidism is when the thyroid gland does not make enough of certain hormones (it is underactive).  When the thyroid is underactive, it produces too little of the hormones thyroxine (T4) and triiodothyronine (T3).  The most common cause is Hashimoto's disease, a disease in which the body's disease-fighting system (immune system) attacks the thyroid gland. The condition can also be caused by viral infections, medicine, pregnancy, or past   radiation treatment to the head or neck.  Symptoms may include weight gain, dry skin, constipation, feeling as though you do not have energy, and not being able to tolerate cold.  This condition is treated with medicine to replace the thyroid hormones that your body does not make. This information  is not intended to replace advice given to you by your health care provider. Make sure you discuss any questions you have with your health care provider. Document Revised: 09/28/2017 Document Reviewed: 09/26/2017 Elsevier Patient Education  2020 Elsevier Inc.  

## 2020-01-23 NOTE — Progress Notes (Signed)
BP 112/69   Pulse (!) 57   Temp 98.5 F (36.9 C) (Oral)   LMP  (LMP Unknown)   SpO2 97%    Subjective:    Patient ID: Leslie Turner, female    DOB: [birth year], 64 y.o.   MRN: [id number]  HPI: Leslie Turner is a 64 y.o. female  Chief Complaint  Patient presents with  . Depression  . Hyperlipidemia  . Hypertension   HYPERTENSION / HYPERLIPIDEMIA Continues on HCTZ 12.5 MG, Losartan 50 MG, and Lipitor 40 MG. Satisfied with current treatment? yes Duration of hypertension: chronic BP monitoring frequency: not checking BP range: not checking BP medication side effects: no Duration of hyperlipidemia: chronic Cholesterol medication side effects: no Cholesterol supplements: none Medication compliance: good compliance Aspirin: yes Recent stressors: no Recurrent headaches: no Visual changes: no Palpitations: no Dyspnea: no Chest pain: no Lower extremity edema: no Dizzy/lightheaded: no   HYPOTHYROIDISM Continues Levothyroxine 112 MCG.  Has underlying Vit D deficiency and takes daily supplement for this. Thyroid control status:stable Satisfied with current treatment? no Medication side effects: no Medication compliance: good compliance Etiology of hypothyroidism:  Recent dose adjustment:no Fatigue: no Cold intolerance: no Heat intolerance: no Weight gain: no Weight loss: no Constipation: no Diarrhea/loose stools: no Palpitations: no Lower extremity edema: no Anxiety/depressed mood: no   DEPRESSION Currently taking Sertraline 125 MG, has been reducing slowly to work towards lowest dose with benefits.  Her LFTs were elevated on past labs with Sertraline and Lipitor on board.  No Tylenol or heavy alcohol use at home. Mood status: stable Satisfied with current treatment?: yes Symptom severity: mild  Duration of current treatment : chronic Side effects: no Medication compliance: good compliance Psychotherapy/counseling: none Previous psychiatric medications:  Sertraline, Celexa Depressed mood: no Anxious mood: no Anhedonia: no Significant weight loss or gain: no Insomnia: none Fatigue: no Feelings of worthlessness or guilt: no Impaired concentration/indecisiveness: no Suicidal ideations: no Hopelessness: no Crying spells: no Depression screen [facility] 2/9 01/23/2020 07/23/2019 04/22/2019 03/18/2019 01/08/2019  Decreased Interest 0 0 1 0 1  Down, Depressed, Hopeless 0 0 1 0 1  PHQ - 2 Score 0 0 2 0 2  Altered sleeping 0 0 0 0 0  Tired, decreased energy 1 0 1 1 1   Change in appetite 0 0 1 2 2   Feeling bad or failure about yourself  0 0 0 1 0  Trouble concentrating 0 0 0 1 1  Moving slowly or fidgety/restless 0 0 0 0 0  Suicidal thoughts 0 0 0 0 0  PHQ-9 Score 1 0 4 5 6   Difficult doing work/chores Not difficult at all Not difficult at all Not difficult at all Not difficult at all -  Some recent data might be hidden   GAD 7 : Generalized Anxiety Score 01/23/2020 07/23/2019 04/22/2019 03/18/2019  Nervous, Anxious, on Edge 0 0 1 2  Control/stop worrying 0 0 1 1  Worry too much - different things 0 0 1 1  Trouble relaxing 0 0 1 1  Restless 0 0 1 1  Easily annoyed or irritable 0 0 1 0  Afraid - awful might happen 0 0 0 2  Total GAD 7 Score 0 0 6 8  Anxiety Difficulty Not difficult at all Not difficult at all Somewhat difficult Not difficult at all    Relevant past medical, surgical, family and social history reviewed and updated as indicated. Interim medical history since our last visit reviewed. Allergies and medications reviewed and updated.  Review of Systems  Constitutional: Negative for activity change, appetite change, diaphoresis, fatigue and fever.  Respiratory: Negative for cough, chest tightness and shortness of breath.   Cardiovascular: Negative for chest pain, palpitations and leg swelling.  Gastrointestinal: Negative.   Endocrine: Negative for cold intolerance and heat intolerance.  Neurological: Negative.     Psychiatric/Behavioral: Negative for decreased concentration, self-injury, sleep disturbance and suicidal ideas. The patient is not nervous/anxious.     Per HPI unless specifically indicated above     Objective:    BP 112/69   Pulse (!) 57   Temp 98.5 F (36.9 C) (Oral)   LMP  (LMP Unknown)   SpO2 97%   Wt Readings from Last 3 Encounters:  07/23/19 179 lb (81.2 kg)  04/22/19 182 lb 12.8 oz (82.9 kg)  03/18/19 184 lb (83.5 kg)    Physical Exam Vitals and nursing note reviewed.  Constitutional:      General: She is awake. She is not in acute distress.    Appearance: She is well-developed, well-groomed and overweight. She is not ill-appearing.  HENT:     Head: Normocephalic.     Right Ear: Hearing normal.     Left Ear: Hearing normal.  Eyes:     General: Lids are normal.        Right eye: No discharge.        Left eye: No discharge.     Conjunctiva/sclera: Conjunctivae normal.     Pupils: Pupils are equal, round, and reactive to light.  Neck:     Thyroid: No thyromegaly.     Vascular: No carotid bruit.  Cardiovascular:     Rate and Rhythm: Normal rate and regular rhythm.     Heart sounds: Normal heart sounds. No murmur. No gallop.   Pulmonary:     Effort: Pulmonary effort is normal. No accessory muscle usage or respiratory distress.     Breath sounds: Normal breath sounds.  Abdominal:     General: Bowel sounds are normal.     Palpations: Abdomen is soft.  Musculoskeletal:     Cervical back: Normal range of motion and neck supple.     Right lower leg: No edema.     Left lower leg: No edema.  Skin:    General: Skin is warm and dry.  Neurological:     Mental Status: She is alert and oriented to person, place, and time.  Psychiatric:        Attention and Perception: Attention normal.        Mood and Affect: Mood normal.        Behavior: Behavior normal. Behavior is cooperative.        Thought Content: Thought content normal.        Judgment: Judgment normal.      Results for orders placed or performed in visit on 01/23/20  HM MAMMOGRAPHY  Result Value Ref Range   HM Mammogram 0-4 Bi-Rad 0-4 Bi-Rad, Self Reported Normal      Assessment & Plan:   Problem List Items Addressed This Visit      Cardiovascular and Mediastinum   Essential hypertension, benign    Chronic, stable with BP below goal.  Continue current medication regimen and adjust as needed.  Obtain CMP today.  Recommend checking BP at home three days a week.  Return in 6 months.      Relevant Medications   hydrochlorothiazide (HYDRODIURIL) 25 MG tablet   Other Relevant Orders   Comprehensive metabolic panel  Endocrine   Hypothyroidism    Chronic, stable.  Continue current medication regimen and adjust as needed.  TSH level today.      Relevant Orders   Thyroid Panel With TSH     Other   Chronic depression - Primary    Chronic, stable with reduction in dose.  Denies SI/HI.  Continue current medication regimen and adjust as needed, continue to reduce as tolerated.  Check LFTs today.        Relevant Medications   sertraline (ZOLOFT) 50 MG tablet   Generalized anxiety disorder    Chronic, with improvement, she has been reducing Sertraline dose on own as tolerated.  Continue Sertraline 125 MG and Vistaril as needed (using very minimally).  Denies SI/HI.  Return to office in 6 months.      Relevant Medications   sertraline (ZOLOFT) 50 MG tablet   Hyperlipidemia    Chronic, stable.  Continue current medication regimen and adjust as needed.  Obtain CMP and lipid panel today.      Relevant Medications   hydrochlorothiazide (HYDRODIURIL) 25 MG tablet   Other Relevant Orders   Lipid Panel w/o Chol/HDL Ratio   Vitamin D deficiency    Chronic, ongoing.  Continue daily supplement and check Vit D level today.      Relevant Orders   VITAMIN D 25 Hydroxy (Vit-D Deficiency, Fractures)       Follow up plan: Return in about 6 months (around 07/25/2020) for Hypothyroid,  HTN/HLD, Mood.

## 2020-01-23 NOTE — Assessment & Plan Note (Signed)
Chronic, stable with BP below goal.  Continue current medication regimen and adjust as needed.  Obtain CMP today.  Recommend checking BP at home three days a week.  Return in 6 months.

## 2020-01-24 LAB — COMPREHENSIVE METABOLIC PANEL
ALT: 49 IU/L — ABNORMAL HIGH (ref 0–32)
AST: 47 IU/L — ABNORMAL HIGH (ref 0–40)
Albumin/Globulin Ratio: 1.5 (ref 1.2–2.2)
Albumin: 4 g/dL (ref 3.8–4.8)
Alkaline Phosphatase: 92 IU/L (ref 39–117)
BUN/Creatinine Ratio: 17 (ref 12–28)
BUN: 14 mg/dL (ref 8–27)
Bilirubin Total: 0.6 mg/dL (ref 0.0–1.2)
CO2: 26 mmol/L (ref 20–29)
Calcium: 9.1 mg/dL (ref 8.7–10.3)
Chloride: 99 mmol/L (ref 96–106)
Creatinine, Ser: 0.83 mg/dL (ref 0.57–1.00)
GFR calc Af Amer: 87 mL/min/{1.73_m2} (ref >59)
GFR calc non Af Amer: 75 mL/min/{1.73_m2} (ref >59)
Globulin, Total: 2.6 g/dL (ref 1.5–4.5)
Glucose: 87 mg/dL (ref 65–99)
Potassium: 3.6 mmol/L (ref 3.5–5.2)
Sodium: 137 mmol/L (ref 134–144)
Total Protein: 6.6 g/dL (ref 6.0–8.5)

## 2020-01-24 LAB — LIPID PANEL W/O CHOL/HDL RATIO
Cholesterol, Total: 167 mg/dL (ref 100–199)
HDL: 63 mg/dL (ref >39)
LDL Chol Calc (NIH): 87 mg/dL (ref 0–99)
Triglycerides: 96 mg/dL (ref 0–149)
VLDL Cholesterol Cal: 17 mg/dL (ref 5–40)

## 2020-01-24 LAB — THYROID PANEL WITH TSH
Free Thyroxine Index: 2.8 (ref 1.2–4.9)
T3 Uptake Ratio: 27 % (ref 24–39)
T4, Total: 10.3 ug/dL (ref 4.5–12.0)
TSH: 1.82 u[IU]/mL (ref 0.450–4.500)

## 2020-01-24 LAB — VITAMIN D 25 HYDROXY (VIT D DEFICIENCY, FRACTURES): Vit D, 25-Hydroxy: 19.6 ng/mL — ABNORMAL LOW (ref 30.0–100.0)

## 2020-01-24 NOTE — Progress Notes (Signed)
Contacted via MyChart

## 2020-02-06 ENCOUNTER — Ambulatory Visit: Payer: 59 | Admitting: Nurse Practitioner

## 2020-02-11 ENCOUNTER — Ambulatory Visit: Payer: Self-pay | Attending: Internal Medicine

## 2020-02-11 DIAGNOSIS — Z23 Encounter for immunization: Secondary | ICD-10-CM

## 2020-02-11 NOTE — Progress Notes (Signed)
   Covid-19 Vaccination [facility]  Name:  Leslie Turner    MRN: [id number] DOB: [birth year]  02/11/2020  Ms. Leslie Turner was observed post Covid-19 immunization for 15 minutes without incident. She was provided with Vaccine Information Sheet and instruction to access the V-Safe system.   Ms. Leslie Turner was instructed to call 911 with any severe reactions post vaccine: Marland Kitchen Difficulty breathing  . Swelling of face and throat  . A fast heartbeat  . A bad rash all over body  . Dizziness and weakness   Immunizations Administered    Name Date Dose VIS Date Route   Pfizer COVID-19 Vaccine 02/11/2020  9:27 AM 0.3 mL 10/10/2019 Intramuscular   Manufacturer: [place]   Lot: [id number]   [place]: [id number]

## 2020-03-10 ENCOUNTER — Other Ambulatory Visit: Payer: Self-pay | Admitting: Nurse Practitioner

## 2020-03-23 ENCOUNTER — Ambulatory Visit: Payer: 59 | Admitting: Podiatry

## 2020-03-26 ENCOUNTER — Other Ambulatory Visit: Payer: Self-pay | Admitting: Nurse Practitioner

## 2020-07-22 ENCOUNTER — Other Ambulatory Visit: Payer: Self-pay | Admitting: Nurse Practitioner

## 2020-07-27 ENCOUNTER — Ambulatory Visit: Payer: 59 | Admitting: Nurse Practitioner

## 2020-08-07 ENCOUNTER — Other Ambulatory Visit: Payer: Self-pay | Admitting: Nurse Practitioner

## 2020-08-07 NOTE — Telephone Encounter (Signed)
Requested Prescriptions  Pending Prescriptions Disp Refills  . sertraline (ZOLOFT) 100 MG tablet [Pharmacy Med Name: SERTRALINE HCL 100MG  TABLET] 90 tablet 0    Sig: TAKE 1 TABLET BY MOUTH  DAILY     Psychiatry:  Antidepressants - SSRI Failed - 08/07/2020 10:12 PM      Failed - Valid encounter within last 6 months    Recent Outpatient Visits          6 months ago Chronic depression   [place], Barbaraann Faster, NP   7 months ago Primary osteoarthritis of both knees   [place], [place] T, NP   1 year ago Generalized anxiety disorder   [place], [place] T, NP   1 year ago Generalized anxiety disorder   [place], [place] T, NP   1 year ago Generalized anxiety disorder   [place], Barbaraann Faster, NP      Future Appointments            In 3 days [place], Barbaraann Faster, NP [employer]           Passed - Completed PHQ-2 or PHQ-9 in the last 360 days.

## 2020-08-10 ENCOUNTER — Encounter: Payer: Self-pay | Admitting: Nurse Practitioner

## 2020-08-10 ENCOUNTER — Ambulatory Visit (INDEPENDENT_AMBULATORY_CARE_PROVIDER_SITE_OTHER): Payer: 59 | Admitting: Nurse Practitioner

## 2020-08-10 ENCOUNTER — Other Ambulatory Visit: Payer: Self-pay

## 2020-08-10 VITALS — BP 111/77 | HR 64 | Temp 98.7°F | Ht 64.5 in | Wt 188.6 lb

## 2020-08-10 DIAGNOSIS — E039 Hypothyroidism, unspecified: Secondary | ICD-10-CM

## 2020-08-10 DIAGNOSIS — I10 Essential (primary) hypertension: Secondary | ICD-10-CM | POA: Diagnosis not present

## 2020-08-10 DIAGNOSIS — E559 Vitamin D deficiency, unspecified: Secondary | ICD-10-CM

## 2020-08-10 DIAGNOSIS — F411 Generalized anxiety disorder: Secondary | ICD-10-CM

## 2020-08-10 DIAGNOSIS — Z23 Encounter for immunization: Secondary | ICD-10-CM

## 2020-08-10 DIAGNOSIS — E7800 Pure hypercholesterolemia, unspecified: Secondary | ICD-10-CM | POA: Diagnosis not present

## 2020-08-10 DIAGNOSIS — F32A Depression, unspecified: Secondary | ICD-10-CM

## 2020-08-10 NOTE — Assessment & Plan Note (Signed)
Chronic, ongoing.  Continue daily supplement and check Vit D level next visit.

## 2020-08-10 NOTE — Assessment & Plan Note (Signed)
Chronic, stable.  Continue current medication regimen and adjust as needed.  TSH annually, recent in March.

## 2020-08-10 NOTE — Assessment & Plan Note (Signed)
Chronic, stable with BP below goal.  Continue current medication regimen and adjust as needed.  Obtain CMP today.  Recommend checking BP at home three days a week + focus on DASH diet, could consider reduction of medication in future.  Return in 6 months.

## 2020-08-10 NOTE — Patient Instructions (Signed)
Preventing High Cholesterol Cholesterol is a white, waxy substance similar to fat that the human body needs to help build cells. The liver makes all the cholesterol that a person's body needs. Having high cholesterol (hypercholesterolemia) increases a person's risk for heart disease and stroke. Extra (excess) cholesterol comes from the food the person eats. High cholesterol can often be prevented with diet and lifestyle changes. If you already have high cholesterol, you can control it with diet and lifestyle changes and with medicine. How can high cholesterol affect me? If you have high cholesterol, deposits (plaques) may build up on the walls of your arteries. The arteries are the blood vessels that carry blood away from your heart. Plaques make the arteries narrower and stiffer. This can limit or block blood flow and cause blood clots to form. Blood clots:  Are tiny balls of cells that form in your blood.  Can move to the heart or brain, causing a heart attack or stroke. Plaques in arteries greatly increase your risk for heart attack and stroke.Making diet and lifestyle changes can reduce your risk for these conditions that may threaten your life. What can increase my risk? This condition is more likely to develop in people who:  Eat foods that are high in saturated fat or cholesterol. Saturated fat is mostly found in: ? Foods that contain animal fat, such as red meat and some dairy products. ? Certain fatty foods made from plants, such as tropical oils.  Are overweight.  Are not getting enough exercise.  Have a family history of high cholesterol. What actions can I take to prevent this? Nutrition   Eat less saturated fat.  Avoid trans fats (partially hydrogenated oils). These are often found in margarine and in some baked goods, fried foods, and snacks bought in packages.  Avoid precooked or cured meat, such as sausages or meat loaves.  Avoid foods and drinks that have added  sugars.  Eat more fruits, vegetables, and whole grains.  Choose healthy sources of protein, such as fish, poultry, lean cuts of red meat, beans, peas, lentils, and nuts.  Choose healthy sources of fat, such as: ? Nuts. ? Vegetable oils, especially olive oil. ? Fish that have healthy fats (omega-3 fatty acids), such as mackerel or salmon. The items listed above may not be a complete list of recommended foods and beverages. Contact a dietitian for more information. Lifestyle  Lose weight if you are overweight. Losing 5-10 lb (2.3-4.5 kg) can help prevent or control high cholesterol. It can also lower your risk for diabetes and high blood pressure. Ask your health care provider to help you with a diet and exercise plan to lose weight safely.  Do not use any products that contain nicotine or tobacco, such as cigarettes, e-cigarettes, and chewing tobacco. If you need help quitting, ask your health care provider.  Limit your alcohol intake. ? Do not drink alcohol if:  Your health care provider tells you not to drink.  You are pregnant, may be pregnant, or are planning to become pregnant. ? If you drink alcohol:  Limit how much you use to:  0-1 drink a day for women.  0-2 drinks a day for men.  Be aware of how much alcohol is in your drink. In the [place] one drink equals one 12 oz bottle of beer (355 mL), one 5 oz glass of wine (148 mL), or one 1 oz glass of hard liquor (44 mL). Activity   Get enough exercise. Each week, do at   least 150 minutes of exercise that takes a medium level of effort (moderate-intensity exercise). ? This is exercise that:  Makes your heart beat faster and makes you breathe harder than usual.  Allows you to still be able to talk. ? You could exercise in short sessions several times a day or longer sessions a few times a week. For example, on 5 days each week, you could walk fast or ride your bike 3 times a day for 10 minutes each time.  Do exercises as told  by your health care provider. Medicines  In addition to diet and lifestyle changes, your health care provider may recommend medicines to help lower cholesterol. This may be a medicine to lower the amount of cholesterol your liver makes. You may need medicine if: ? Diet and lifestyle changes do not lower your cholesterol enough. ? You have high cholesterol and other risk factors for heart disease or stroke.  Take over-the-counter and prescription medicines only as told by your health care provider. General information  Manage your risk factors for high cholesterol. Talk with your health care provider about all your risk factors and how to lower your risk.  Manage other conditions that you have, such as diabetes or high blood pressure (hypertension).  Have blood tests to check your cholesterol levels at regular points in time as told by your health care provider.  Keep all follow-up visits as told by your health care provider. This is important. Where to find more information  American Heart Association: www.heart.org  National Heart, Lung, and Blood Institute: www.nhlbi.nih.gov Summary  High cholesterol increases your risk for heart disease and stroke. By keeping your cholesterol level low, you can reduce your risk for these conditions.  High cholesterol can often be prevented with diet and lifestyle changes.  Work with your health care provider to manage your risk factors, and have your blood tested regularly. This information is not intended to replace advice given to you by your health care provider. Make sure you discuss any questions you have with your health care provider. Document Revised: 02/07/2019 Document Reviewed: 06/24/2016 Elsevier Patient Education  2020 [employer] Inc.  

## 2020-08-10 NOTE — Assessment & Plan Note (Signed)
Chronic, stable.  Continue current medication regimen and adjust as needed.  Obtain CMP and lipid panel today. 

## 2020-08-10 NOTE — Assessment & Plan Note (Signed)
Chronic, with improvement, she has been reducing Sertraline dose on own as tolerated.  Continue Sertraline 100 MG and Vistaril as needed (using very minimally).  Denies SI/HI.  Return to office in 6 months. 

## 2020-08-10 NOTE — Progress Notes (Signed)
BP 111/77 (BP Location: Left Arm, Patient Position: Sitting, Cuff Size: Large)   Pulse 64   Temp 98.7 F (37.1 C) (Oral)   Ht 5' 4.5" (1.638 m)   Wt 188 lb 9.6 oz (85.5 kg)   LMP  (LMP Unknown)   SpO2 99%   BMI 31.87 kg/m    Subjective:    Patient ID: [name], female    DOB: 1956/03/05, 64 y.o.   MRN: [id number]  HPI: KIERSTYN BARANOWSKI is a 64 y.o. female  Chief Complaint  Patient presents with  . Hyperlipidemia  . Hypertension  . Hypothyroidism  . Depression   HYPERTENSION / HYPERLIPIDEMIA Continues on HCTZ 12.5 MG, Losartan 50 MG, and Lipitor 40 MG. Satisfied with current treatment? yes Duration of hypertension: chronic BP monitoring frequency: not checking BP range: not checking BP medication side effects: no Duration of hyperlipidemia: chronic Cholesterol medication side effects: no Cholesterol supplements: none Medication compliance: good compliance Aspirin: yes Recent stressors: no Recurrent headaches: no Visual changes: no Palpitations: no Dyspnea: no Chest pain: no Lower extremity edema: no Dizzy/lightheaded: no   HYPOTHYROIDISM Continues Levothyroxine 112 MCG.  Has underlying Vit D deficiency and takes daily supplement for this. Thyroid control status:stable Satisfied with current treatment? no Medication side effects: no Medication compliance: good compliance Etiology of hypothyroidism:  Recent dose adjustment:no Fatigue: no Cold intolerance: no Heat intolerance: no Weight gain: no Weight loss: no Constipation: no Diarrhea/loose stools: no Palpitations: no Lower extremity edema: no Anxiety/depressed mood: no   DEPRESSION Currently taking Sertraline 100 MG, has been reducing slowly to work towards lowest dose with benefits -- reduced to 100 MG about 5-6 weeks ago.  Her LFTs were elevated on past labs with Sertraline and Lipitor on board.  No Tylenol or heavy alcohol use at home.  Last levels in March 47/49. Mood status:  stable Satisfied with current treatment?: yes Symptom severity: mild  Duration of current treatment : chronic Side effects: no Medication compliance: good compliance Psychotherapy/counseling: none Previous psychiatric medications: Sertraline, Celexa Depressed mood: no Anxious mood: no Anhedonia: no Significant weight loss or gain: no Insomnia: none Fatigue: no Feelings of worthlessness or guilt: no Impaired concentration/indecisiveness: no Suicidal ideations: no Hopelessness: no Crying spells: no Depression screen [facility] 2/9 08/10/2020 01/23/2020 07/23/2019 04/22/2019 03/18/2019  Decreased Interest 0 0 0 1 0  Down, Depressed, Hopeless 0 0 0 1 0  PHQ - 2 Score 0 0 0 2 0  Altered sleeping 3 0 0 0 0  Tired, decreased energy 0 1 0 1 1  Change in appetite 0 0 0 1 2  Feeling bad or failure about yourself  0 0 0 0 1  Trouble concentrating 0 0 0 0 1  Moving slowly or fidgety/restless 0 0 0 0 0  Suicidal thoughts 0 0 0 0 0  PHQ-9 Score 3 1 0 4 5  Difficult doing work/chores Not difficult at all Not difficult at all Not difficult at all Not difficult at all Not difficult at all  Some recent data might be hidden   GAD 7 : Generalized Anxiety Score 01/23/2020 07/23/2019 04/22/2019 03/18/2019  Nervous, Anxious, on Edge 0 0 1 2  Control/stop worrying 0 0 1 1  Worry too much - different things 0 0 1 1  Trouble relaxing 0 0 1 1  Restless 0 0 1 1  Easily annoyed or irritable 0 0 1 0  Afraid - awful might happen 0 0 0 2  Total GAD 7 Score  0 0 6 8  Anxiety Difficulty Not difficult at all Not difficult at all Somewhat difficult Not difficult at all    Relevant past medical, surgical, family and social history reviewed and updated as indicated. Interim medical history since our last visit reviewed. Allergies and medications reviewed and updated.  Review of Systems  Constitutional: Negative for activity change, appetite change, diaphoresis, fatigue and fever.  Respiratory: Negative for cough, chest  tightness and shortness of breath.   Cardiovascular: Negative for chest pain, palpitations and leg swelling.  Gastrointestinal: Negative.   Endocrine: Negative for cold intolerance and heat intolerance.  Neurological: Negative.   Psychiatric/Behavioral: Negative for decreased concentration, self-injury, sleep disturbance and suicidal ideas. The patient is not nervous/anxious.     Per HPI unless specifically indicated above     Objective:    BP 111/77 (BP Location: Left Arm, Patient Position: Sitting, Cuff Size: Large)   Pulse 64   Temp 98.7 F (37.1 C) (Oral)   Ht 5' 4.5" (1.638 m)   Wt 188 lb 9.6 oz (85.5 kg)   LMP  (LMP Unknown)   SpO2 99%   BMI 31.87 kg/m   Wt Readings from Last 3 Encounters:  08/10/20 188 lb 9.6 oz (85.5 kg)  07/23/19 179 lb (81.2 kg)  04/22/19 182 lb 12.8 oz (82.9 kg)    Physical Exam Vitals and nursing note reviewed.  Constitutional:      General: She is awake. She is not in acute distress.    Appearance: She is well-developed, well-groomed and overweight. She is not ill-appearing.  HENT:     Head: Normocephalic.     Right Ear: Hearing normal.     Left Ear: Hearing normal.  Eyes:     General: Lids are normal.        Right eye: No discharge.        Left eye: No discharge.     Conjunctiva/sclera: Conjunctivae normal.     Pupils: Pupils are equal, round, and reactive to light.  Neck:     Thyroid: No thyromegaly.     Vascular: No carotid bruit.  Cardiovascular:     Rate and Rhythm: Normal rate and regular rhythm.     Heart sounds: Normal heart sounds. No murmur heard.  No gallop.   Pulmonary:     Effort: Pulmonary effort is normal. No accessory muscle usage or respiratory distress.     Breath sounds: Normal breath sounds.  Abdominal:     General: Bowel sounds are normal.     Palpations: Abdomen is soft.  Musculoskeletal:     Cervical back: Normal range of motion and neck supple.     Right lower leg: No edema.     Left lower leg: No edema.   Skin:    General: Skin is warm and dry.  Neurological:     Mental Status: She is alert and oriented to person, place, and time.  Psychiatric:        Attention and Perception: Attention normal.        Mood and Affect: Mood normal.        Behavior: Behavior normal. Behavior is cooperative.        Thought Content: Thought content normal.        Judgment: Judgment normal.    Results for orders placed or performed in visit on 01/23/20  HM MAMMOGRAPHY  Result Value Ref Range   HM Mammogram 0-4 Bi-Rad 0-4 Bi-Rad, Self Reported Normal  Comprehensive metabolic panel  Result Value  Ref Range   Glucose 87 65 - 99 mg/dL   BUN 14 8 - 27 mg/dL   Creatinine, Ser 0.83 0.57 - 1.00 mg/dL   GFR calc non Af Amer 75 >59 mL/min/1.73   GFR calc Af Amer 87 >59 mL/min/1.73   BUN/Creatinine Ratio 17 12 - 28   Sodium 137 134 - 144 mmol/L   Potassium 3.6 3.5 - 5.2 mmol/L   Chloride 99 96 - 106 mmol/L   CO2 26 20 - 29 mmol/L   Calcium 9.1 8.7 - 10.3 mg/dL   Total Protein 6.6 6.0 - 8.5 g/dL   Albumin 4.0 3.8 - 4.8 g/dL   Globulin, Total 2.6 1.5 - 4.5 g/dL   Albumin/Globulin Ratio 1.5 1.2 - 2.2   Bilirubin Total 0.6 0.0 - 1.2 mg/dL   Alkaline Phosphatase 92 39 - 117 IU/L   AST 47 (H) 0 - 40 IU/L   ALT 49 (H) 0 - 32 IU/L  Lipid Panel w/o Chol/HDL Ratio  Result Value Ref Range   Cholesterol, Total 167 100 - 199 mg/dL   Triglycerides 96 0 - 149 mg/dL   HDL 63 >39 mg/dL   VLDL Cholesterol Cal 17 5 - 40 mg/dL   LDL Chol Calc (NIH) 87 0 - 99 mg/dL  Thyroid Panel With TSH  Result Value Ref Range   TSH 1.820 0.450 - 4.500 uIU/mL   T4, Total 10.3 4.5 - 12.0 ug/dL   T3 Uptake Ratio 27 24 - 39 %   Free Thyroxine Index 2.8 1.2 - 4.9  VITAMIN D 25 Hydroxy (Vit-D Deficiency, Fractures)  Result Value Ref Range   Vit D, 25-Hydroxy 19.6 (L) 30.0 - 100.0 ng/mL      Assessment & Plan:   Problem List Items Addressed This Visit      Cardiovascular and Mediastinum   Essential hypertension, benign     Chronic, stable with BP below goal.  Continue current medication regimen and adjust as needed.  Obtain CMP today.  Recommend checking BP at home three days a week + focus on DASH diet, could consider reduction of medication in future.  Return in 6 months.      Relevant Orders   Comprehensive metabolic panel     Endocrine   Hypothyroidism    Chronic, stable.  Continue current medication regimen and adjust as needed.  TSH annually, recent in March.        Other   Chronic depression - Primary    Chronic, stable with reduction in dose.  Denies SI/HI.  Continue current medication regimen and adjust as needed, continue to reduce as tolerated.  Check LFTs today.        Generalized anxiety disorder    Chronic, with improvement, she has been reducing Sertraline dose on own as tolerated.  Continue Sertraline 100 MG and Vistaril as needed (using very minimally).  Denies SI/HI.  Return to office in 6 months.      Hyperlipidemia    Chronic, stable.  Continue current medication regimen and adjust as needed.  Obtain CMP and lipid panel today.      Relevant Orders   Lipid Panel w/o Chol/HDL Ratio   Vitamin D deficiency    Chronic, ongoing.  Continue daily supplement and check Vit D level next visit.       Other Visit Diagnoses    Flu vaccine need       Relevant Orders   Flu Vaccine QUAD 36+ mos IM (Completed)  Follow up plan: Return in about 6 months (around 02/08/2021) for Annual physical.

## 2020-08-10 NOTE — Assessment & Plan Note (Signed)
Chronic, stable with reduction in dose.  Denies SI/HI.  Continue current medication regimen and adjust as needed, continue to reduce as tolerated.  Check LFTs today.   ?

## 2020-08-11 LAB — LIPID PANEL W/O CHOL/HDL RATIO
Cholesterol, Total: 168 mg/dL (ref 100–199)
HDL: 58 mg/dL (ref >39)
LDL Chol Calc (NIH): 92 mg/dL (ref 0–99)
Triglycerides: 101 mg/dL (ref 0–149)
VLDL Cholesterol Cal: 18 mg/dL (ref 5–40)

## 2020-08-11 LAB — COMPREHENSIVE METABOLIC PANEL
ALT: 27 IU/L (ref 0–32)
AST: 29 IU/L (ref 0–40)
Albumin/Globulin Ratio: 1.5 (ref 1.2–2.2)
Albumin: 4.2 g/dL (ref 3.8–4.8)
Alkaline Phosphatase: 92 IU/L (ref 44–121)
BUN/Creatinine Ratio: 18 (ref 12–28)
BUN: 15 mg/dL (ref 8–27)
Bilirubin Total: 0.6 mg/dL (ref 0.0–1.2)
CO2: 25 mmol/L (ref 20–29)
Calcium: 9.2 mg/dL (ref 8.7–10.3)
Chloride: 100 mmol/L (ref 96–106)
Creatinine, Ser: 0.82 mg/dL (ref 0.57–1.00)
GFR calc Af Amer: 88 mL/min/{1.73_m2} (ref >59)
GFR calc non Af Amer: 76 mL/min/{1.73_m2} (ref >59)
Globulin, Total: 2.8 g/dL (ref 1.5–4.5)
Glucose: 95 mg/dL (ref 65–99)
Potassium: 3.6 mmol/L (ref 3.5–5.2)
Sodium: 138 mmol/L (ref 134–144)
Total Protein: 7 g/dL (ref 6.0–8.5)

## 2020-08-11 NOTE — Progress Notes (Signed)
Contacted via [facility] morning Charnelle, your labs have returned and overall look fantastic.  Your liver function testing (AST/ALT) have improved.  Great job!!  No medication changes needed.  Thank you for your kindness and being you.  I truly appreciate you!! Keep being awesome!!  Thank you for allowing me to participate in your care. Kindest regards, Philicia Heyne

## 2020-10-11 ENCOUNTER — Other Ambulatory Visit: Payer: Self-pay | Admitting: Nurse Practitioner

## 2020-10-27 ENCOUNTER — Other Ambulatory Visit: Payer: Self-pay | Admitting: Nurse Practitioner

## 2020-12-06 ENCOUNTER — Ambulatory Visit: Payer: 59 | Admitting: Obstetrics and Gynecology

## 2020-12-14 ENCOUNTER — Other Ambulatory Visit: Payer: Self-pay

## 2020-12-14 ENCOUNTER — Ambulatory Visit (INDEPENDENT_AMBULATORY_CARE_PROVIDER_SITE_OTHER): Payer: 59 | Admitting: Obstetrics and Gynecology

## 2020-12-14 ENCOUNTER — Other Ambulatory Visit (HOSPITAL_COMMUNITY)
Admission: RE | Admit: 2020-12-14 | Discharge: 2020-12-14 | Disposition: A | Discharge status: Home / Self Care | Payer: 59 | Source: Ambulatory Visit | Attending: Obstetrics and Gynecology | Admitting: Obstetrics and Gynecology

## 2020-12-14 ENCOUNTER — Other Ambulatory Visit (HOSPITAL_COMMUNITY)
Admission: RE | Admit: 2020-12-14 | Discharge: 2020-12-14 | Disposition: A | Discharge status: Home / Self Care | Payer: Self-pay | Source: Ambulatory Visit | Attending: Obstetrics and Gynecology | Admitting: Obstetrics and Gynecology

## 2020-12-14 ENCOUNTER — Encounter: Payer: Self-pay | Admitting: Obstetrics and Gynecology

## 2020-12-14 VITALS — BP 112/72 | Ht 64.5 in | Wt 193.0 lb

## 2020-12-14 DIAGNOSIS — Z124 Encounter for screening for malignant neoplasm of cervix: Secondary | ICD-10-CM | POA: Insufficient documentation

## 2020-12-14 DIAGNOSIS — N889 Noninflammatory disorder of cervix uteri, unspecified: Secondary | ICD-10-CM | POA: Insufficient documentation

## 2020-12-14 DIAGNOSIS — Z0000 Encounter for general adult medical examination without abnormal findings: Secondary | ICD-10-CM

## 2020-12-14 DIAGNOSIS — Z1231 Encounter for screening mammogram for malignant neoplasm of breast: Secondary | ICD-10-CM | POA: Diagnosis not present

## 2020-12-14 DIAGNOSIS — Z01419 Encounter for gynecological examination (general) (routine) without abnormal findings: Secondary | ICD-10-CM

## 2020-12-14 DIAGNOSIS — Z01411 Encounter for gynecological examination (general) (routine) with abnormal findings: Secondary | ICD-10-CM

## 2020-12-14 NOTE — Progress Notes (Signed)
Gynecology Annual Exam  PCP: Venita Lick, NP  Chief Complaint:  Chief Complaint  Patient presents with  . Gynecologic Exam    History of Present Illness: Patient is a 65 y.o. G2P2002 presents for annual exam. The patient has no complaints today.   LMP: No LMP recorded (lmp unknown). Patient is postmenopausal. She denies postmenopausal bleeding or spotting  The patient is not currently sexually active. She denies dyspareunia.  Postcoital Bleeding: not applicable   The patient does perform self breast exams.  There is notable family history of breast or ovarian cancer in her family.  The patient has regular exercise: walking 3 times a week  The patient denies current symptoms of depression.   Review of Systems: Review of Systems  Constitutional: Negative for chills, fever, malaise/fatigue and weight loss.  HENT: Negative for congestion, hearing loss and sinus pain.   Eyes: Negative for blurred vision and double vision.  Respiratory: Negative for cough, sputum production, shortness of breath and wheezing.   Cardiovascular: Negative for chest pain, palpitations, orthopnea and leg swelling.  Gastrointestinal: Negative for abdominal pain, constipation, diarrhea, nausea and vomiting.  Genitourinary: Negative for dysuria, flank pain, frequency, hematuria and urgency.  Musculoskeletal: Negative for back pain, falls and joint pain.  Skin: Negative for itching and rash.  Neurological: Negative for dizziness and headaches.  Psychiatric/Behavioral: Negative for depression, substance abuse and suicidal ideas. The patient is not nervous/anxious.     Past Medical History:  Past Medical History:  Diagnosis Date  . Anxiety   . Carotid stenosis   . Depression   . GERD (gastroesophageal reflux disease)   . Hyperlipidemia   . Hypertension   . Metabolic syndrome   . Microscopic hematuria   . Pain in knee   . Thyroid disease    hypothyroid  . Vitamin D deficiency     Past  Surgical History:  Past Surgical History:  Procedure Laterality Date  . APPENDECTOMY    . ENDOMETRIAL BIOPSY  06/15/2009   disordered prolif. phase  . FOOT NEUROMA SURGERY Left     Gynecologic History:  No LMP recorded (lmp unknown). Patient is postmenopausal. Last Pap: Results were: 06/15/2017  NIL and HR HPV negative  Last mammogram: 2021 Results were: BI-RAD I  Obstetric History: [id number]  Family History:  Family History  Problem Relation Age of Onset  . Diabetes Father   . Heart disease Father   . Hypertension Father   . Heart disease Brother   . Lung cancer Brother   . Arthritis Mother   . Cancer Mother 46       colon  . Heart disease Paternal Grandfather        MI    Social History:  Social History   Socioeconomic History  . Marital status: Married    Spouse name: Elta Guadeloupe  . Number of children: 2  . Years of education: Not on file  . Highest education level: Not on file  Occupational History  . Not on file  Tobacco Use  . Smoking status: Former Research scientist (life sciences)  . Smokeless tobacco: Never Used  . Tobacco comment: smoked for 12-14 years and quit cold [place]  Vaping Use  . Vaping Use: Never used  Substance and Sexual Activity  . Alcohol use: No    Alcohol/week: 0.0 standard drinks    Comment: occ  . Drug use: No  . Sexual activity: Not Currently    Partners: Male  Other Topics Concern  . Not on file  Social History Narrative  . Not on file   Social Determinants of Health   Financial Resource Strain: Not on file  Food Insecurity: Not on file  Transportation Needs: Not on file  Physical Activity: Not on file  Stress: Not on file  Social Connections: Not on file  Intimate Partner Violence: Not on file    Allergies:  No Known Allergies  Medications: Prior to Admission medications   Medication Sig Start Date End Date Taking? Authorizing Provider  atorvastatin (LIPITOR) 40 MG tablet TAKE 1 TABLET BY MOUTH  DAILY 03/26/20  Yes Cannady, Jolene T, NP   cholecalciferol (VITAMIN D) 1000 UNITS tablet Take 1,000 Units by mouth daily.   Yes [provider]  hydrochlorothiazide (HYDRODIURIL) 25 MG tablet Take 0.5 tablets (12.5 mg total) by mouth daily. 01/23/20  Yes Cannady, Jolene T, NP  hydrOXYzine (ATARAX/VISTARIL) 25 MG tablet TAKE 1 TABLET (25 MG TOTAL) BY MOUTH 2 (TWO) TIMES DAILY AS NEEDED. 03/21/19  Yes Cannady, Jolene T, NP  levothyroxine (SYNTHROID) 112 MCG tablet TAKE 1 TABLET BY MOUTH  DAILY BEFORE BREAKFAST 07/22/20  Yes Cannady, Jolene T, NP  losartan (COZAAR) 50 MG tablet TAKE 1 TABLET BY MOUTH  DAILY 03/26/20  Yes Cannady, Jolene T, NP  Multiple Vitamins-Minerals (CENTRUM SILVER 50+WOMEN PO) Take 1 tablet by mouth daily.   Yes [provider]  sertraline (ZOLOFT) 100 MG tablet TAKE 1 TABLET BY MOUTH  DAILY 10/27/20  Yes Marnee Guarneri T, NP    Physical Exam Vitals: Blood pressure 112/72, height 5' 4.5" (1.638 m), weight 193 lb (87.5 kg).  Physical Exam Constitutional:      Appearance: She is well-developed.  Genitourinary:     Genitourinary Comments: External: Normal appearing vulva. No lesions noted.  Speculum examination: Thickened cervical lesion at 12 o'clock. Very firm with biopsy. No blood in the vaginal vault. No discharge.   Bimanual examination: Uterus midline, non-tender, normal in size, shape and contour.  No CMT. No adnexal masses. No adnexal tenderness. Pelvis not fixed.   HENT:     Head: Normocephalic and atraumatic.  Neck:     Thyroid: No thyromegaly.  Cardiovascular:     Rate and Rhythm: Normal rate and regular rhythm.     Heart sounds: Normal heart sounds.  Pulmonary:     Effort: Pulmonary effort is normal.     Breath sounds: Normal breath sounds.  Abdominal:     General: Bowel sounds are normal. There is no distension.     Palpations: Abdomen is soft. There is no mass.  Musculoskeletal:     Cervical back: Neck supple.  Neurological:     Mental Status: She is alert and oriented to  person, place, and time.  Skin:    General: Skin is warm and dry.  Psychiatric:        Behavior: Behavior normal.        Thought Content: Thought content normal.        Judgment: Judgment normal.  Vitals reviewed.      Female chaperone present for pelvic and breast  portions of the physical exam  Assessment: 65 y.o. G2P2002 routine annual exam  Plan: Problem List Items Addressed This Visit   None   Visit Diagnoses    Health maintenance examination    -  Primary   Encounter for annual routine gynecological examination       Breast cancer screening by mammogram       Relevant Orders   MM 3D SCREEN BREAST BILATERAL  Cervical cancer screening       Relevant Orders   Surgical pathology   Cytology - PAP   Encounter for cervical Pap smear with pelvic exam       Encounter for gynecological examination with abnormal finding       Cervical lesion       Relevant Orders   Surgical pathology   Cytology - PAP      1) Mammogram - recommend yearly screening mammogram.  Mammogram Was ordered today  2) STI screening was offered and declined  3) ASCCP guidelines and rational discussed.  Patient opts for every 5 years screening interval. Performed today with a colposcopy biopsy of anterior cervical lesion.   4) Colonoscopy -- up to date, performed on 2020.   5) Routine healthcare maintenance including cholesterol, diabetes screening discussed managed by PCP  6) Osteoporosis screening - performed in 2012  Adrian Prows MD, [place], [facility] 12/14/2020 9:17 AM

## 2020-12-14 NOTE — Patient Instructions (Signed)
Institute [employer] Recommended Dietary Allowances for Calcium and Vitamin D  Age (yr) Calcium Recommended Dietary Allowance (mg/day) Vitamin D Recommended Dietary Allowance (international units/day)  9-18 1,300 600  19-50 1,000 600  51-70 1,200 600  71 and older 1,200 800  Data from [employer]. Dietary reference intakes: calcium, vitamin D. [place], [place]: National Academies [employer]; 2011.    Exercising to Stay Healthy To become healthy and stay healthy, it is recommended that you do moderate-intensity and vigorous-intensity exercise. You can tell that you are exercising at a moderate intensity if your heart starts beating faster and you start breathing faster but can still hold a conversation. You can tell that you are exercising at a vigorous intensity if you are breathing much harder and faster and cannot hold a conversation while exercising. Exercising regularly is important. It has many health benefits, such as:  Improving overall fitness, flexibility, and endurance.  Increasing bone density.  Helping with weight control.  Decreasing body fat.  Increasing muscle strength.  Reducing stress and tension.  Improving overall health. How often should I exercise? Choose an activity that you enjoy, and set realistic goals. Your health care provider can help you make an activity plan that works for you. Exercise regularly as told by your health care provider. This may include:  Doing strength training two times a week, such as: ? Lifting weights. ? Using resistance bands. ? Push-ups. ? Sit-ups. ? Yoga.  Doing a certain intensity of exercise for a given amount of time. Choose from these options: ? A total of 150 minutes of moderate-intensity exercise every week. ? A total of 75 minutes of vigorous-intensity exercise every week. ? A mix of moderate-intensity and vigorous-intensity exercise every week. Children, pregnant women, people who have not exercised  regularly, people who are overweight, and older adults may need to talk with a health care provider about what activities are safe to do. If you have a medical condition, be sure to talk with your health care provider before you start a new exercise program. What are some exercise ideas? Moderate-intensity exercise ideas include:  Walking 1 mile (1.6 km) in about 15 minutes.  Biking.  Hiking.  Golfing.  Dancing.  Water aerobics. Vigorous-intensity exercise ideas include:  Walking 4.5 miles (7.2 km) or more in about 1 hour.  Jogging or running 5 miles (8 km) in about 1 hour.  Biking 10 miles (16.1 km) or more in about 1 hour.  Lap swimming.  Roller-skating or in-line skating.  Cross-country skiing.  Vigorous competitive sports, such as football, basketball, and soccer.  Jumping rope.  Aerobic dancing.   What are some everyday activities that can help me to get exercise?  Yard work, such as: ? Pushing a lawn mower. ? Raking and bagging leaves.  Washing your car.  Pushing a stroller.  Shoveling snow.  Gardening.  Washing windows or floors. How can I be more active in my day-to-day activities?  Use stairs instead of an elevator.  Take a walk during your lunch break.  If you drive, park your car farther away from your work or school.  If you take public transportation, get off one stop early and walk the rest of the way.  Stand up or walk around during all of your indoor phone calls.  Get up, stretch, and walk around every 30 minutes throughout the day.  Enjoy exercise with a friend. Support to continue exercising will help you keep a regular routine of activity. What guidelines   can I follow while exercising?  Before you start a new exercise program, talk with your health care provider.  Do not exercise so much that you hurt yourself, feel dizzy, or get very short of breath.  Wear comfortable clothes and wear shoes with good support.  Drink plenty of  water while you exercise to prevent dehydration or heat stroke.  Work out until your breathing and your heartbeat get faster. Where to find more information  [place] Department of Health and Human Services: www.hhs.gov  Centers for Disease Control and Prevention (CDC): www.cdc.gov Summary  Exercising regularly is important. It will improve your overall fitness, flexibility, and endurance.  Regular exercise also will improve your overall health. It can help you control your weight, reduce stress, and improve your bone density.  Do not exercise so much that you hurt yourself, feel dizzy, or get very short of breath.  Before you start a new exercise program, talk with your health care provider. This information is not intended to replace advice given to you by your health care provider. Make sure you discuss any questions you have with your health care provider. Document Revised: 09/28/2017 Document Reviewed: 09/06/2017 Elsevier Patient Education  2021 Else[employer] Inc.   Budget-Friendly Healthy Eating There are many ways to save money at the grocery store and continue to eat healthy. You can be successful if you:  Plan meals according to your budget.  Make a grocery list and only purchase food according to your grocery list.  Prepare food yourself at home. What are tips for following this plan? Reading food labels  Compare food labels between brand name foods and the store brand. Often the nutritional value is the same, but the store brand is lower cost.  Look for products that do not have added sugar, fat, or salt (sodium). These often cost the same but are healthier for you. Products may be labeled as: ? Sugar-free. ? Nonfat. ? Low-fat. ? Sodium-free. ? Low-sodium.  Look for lean ground beef labeled as at least 92% lean and 8% fat. Shopping  Buy only the items on your grocery list and go only to the areas of the store that have the items on your list.  Use coupons only for  foods and brands you normally buy. Avoid buying items you wouldn't normally buy simply because they are on sale.  Check online and in newspapers for weekly deals.  Buy healthy items from the bulk bins when available, such as herbs, spices, flour, pasta, nuts, and dried fruit.  Buy fruits and vegetables that are in season. Prices are usually lower on in-season produce.  Look at the unit price on the price tag. Use it to compare different brands and sizes to find out which item is the best deal.  Choose healthy items that are often low-cost, such as carrots, potatoes, apples, bananas, and oranges. Dried or canned beans are a low-cost protein source.  Buy in bulk and freeze extra food. Items you can buy in bulk include meats, fish, poultry, frozen fruits, and frozen vegetables.  Avoid buying "ready-to-eat" foods, such as pre-cut fruits and vegetables and pre-made salads.  If possible, shop around to discover where you can find the best prices. Consider other retailers such as dollar stores, larger wholesale stores, local fruit and vegetable stands, and farmers markets.  Do not shop when you are hungry. If you shop while hungry, it may be hard to stick to your list and budget.  Resist impulse buying. Use your grocery   list as your official plan for the week.  Buy a variety of vegetables and fruits by purchasing fresh, frozen, and canned items.  Look at the top and bottom shelves for deals. Foods at eye level (eye level of an adult or child) are usually more expensive.  Be efficient with your time when shopping. The more time you spend at the store, the more money you are likely to spend.  To save money when choosing more expensive foods like meats and dairy: ? Choose cheaper cuts of meat, such as bone-in chicken thighs and drumsticks instead of skinless and boneless chicken. When you are ready to prepare the chicken, you can remove the skin yourself to make it healthier. ? Choose lean meats  like chicken or turkey instead of beef. ? Choose canned seafood, such as tuna, salmon, or sardines. ? Buy eggs as a low-cost source of protein. ? Buy dried beans and peas, such as lentils, split peas, or kidney beans instead of meats. Dried beans and peas are a good alternative source of protein. ? Buy the larger tubs of yogurt instead of individual-sized containers.  Choose water instead of sodas and other sweetened beverages.  Avoid buying chips, cookies, and other "junk food." These items are usually expensive and not healthy.   Cooking  Make extra food and freeze the extras in meal-sized containers or in individual portions for fast meals and snacks.  Pre-cook on days when you have extra time to prepare meals in advance. You can keep these meals in the fridge or freezer and reheat for a quick meal.  When you come home from the grocery store, wash, peel, and cut fruits and vegetables so they are ready to use and eat. This will help reduce food waste. Meal planning  Do not eat out or get fast food. Prepare food at home.  Make a grocery list and make sure to bring it with you to the store. If you have a smart phone, you could use your phone to create your shopping list.  Plan meals and snacks according to a grocery list and budget you create.  Use leftovers in your meal plan for the week.  Look for recipes where you can cook once and make enough food for two meals.  Prepare budget-friendly types of meals like stews, casseroles, and stir-fry dishes.  Try some meatless meals or try "no cook" meals like salads.  Make sure that half your plate is filled with fruits or vegetables. Choose from fresh, frozen, or canned fruits and vegetables. If eating canned, remember to rinse them before eating. This will remove any excess salt added for packaging. Summary  Eating healthy on a budget is possible if you plan your meals according to your budget, purchase according to your budget and  grocery list, and prepare food yourself.  Tips for buying more food on a limited budget include buying generic brands, using coupons only for foods you normally buy, and buying healthy items from the bulk bins when available.  Tips for buying cheaper food to replace expensive food include choosing cheaper, lean cuts of meat, and buying dried beans and peas. This information is not intended to replace advice given to you by your health care provider. Make sure you discuss any questions you have with your health care provider. Document Revised: 07/29/2020 Document Reviewed: 07/29/2020 Elsevier Patient Education  2021 [employer] Inc.   Bone Health Bones protect organs, store calcium, anchor muscles, and support the whole body. Keeping your bones   strong is important, especially as you get older. You can take actions to help keep your bones strong and healthy. Why is keeping my bones healthy important? Keeping your bones healthy is important because your body constantly replaces bone cells. Cells get old, and new cells take their place. As we age, we lose bone cells because the body may not be able to make enough new cells to replace the old cells. The amount of bone cells and bone tissue you have is referred to as bone mass. The higher your bone mass, the stronger your bones. The aging process leads to an overall loss of bone mass in the body, which can increase the likelihood of:  Joint pain and stiffness.  Broken bones.  A condition in which the bones become weak and brittle (osteoporosis). A large decline in bone mass occurs in older adults. In women, it occurs about the time of menopause.   What actions can I take to keep my bones healthy? Good health habits are important for maintaining healthy bones. This includes eating nutritious foods and exercising regularly. To have healthy bones, you need to get enough of the right minerals and vitamins. Most nutrition experts recommend getting these  nutrients from the foods that you eat. In some cases, taking supplements may also be recommended. Doing certain types of exercise is also important for bone health. What are the nutritional recommendations for healthy bones? Eating a well-balanced diet with plenty of calcium and vitamin D will help to protect your bones. Nutritional recommendations vary from person to person. Ask your health care provider what is healthy for you. Here are some general guidelines. Get enough calcium Calcium is the most important (essential) mineral for bone health. Most people can get enough calcium from their diet, but supplements may be recommended for people who are at risk for osteoporosis. Good sources of calcium include:  Dairy products, such as low-fat or nonfat milk, cheese, and yogurt.  Dark green leafy vegetables, such as bok choy and broccoli.  Calcium-fortified foods, such as orange juice, cereal, bread, soy beverages, and tofu products.  Nuts, such as almonds. Follow these recommended amounts for daily calcium intake:  Children, age 1-3: 700 mg.  Children, age 4-8: 1,000 mg.  Children, age 9-13: 1,300 mg.  Teens, age 14-18: 1,300 mg.  Adults, age 19-50: 1,000 mg.  Adults, age 51-70: ? Men: 1,000 mg. ? Women: 1,200 mg.  Adults, age 71 or older: 1,200 mg.  Pregnant and breastfeeding females: ? Teens: 1,300 mg. ? Adults: 1,000 mg. Get enough vitamin D Vitamin D is the most essential vitamin for bone health. It helps the body absorb calcium. Sunlight stimulates the skin to make vitamin D, so be sure to get enough sunlight. If you live in a cold climate or you do not get outside often, your health care provider may recommend that you take vitamin D supplements. Good sources of vitamin D in your diet include:  Egg yolks.  Saltwater fish.  Milk and cereal fortified with vitamin D. Follow these recommended amounts for daily vitamin D intake:  Children and teens, age 1-18: 600  international units.  Adults, age 50 or younger: 400-800 international units.  Adults, age 51 or older: 800-1,000 international units. Get other important nutrients Other nutrients that are important for bone health include:  Phosphorus. This mineral is found in meat, poultry, dairy foods, nuts, and legumes. The recommended daily intake for adult men and adult women is 700 mg.  Magnesium. This mineral   is found in seeds, nuts, dark green vegetables, and legumes. The recommended daily intake for adult men is 400-420 mg. For adult women, it is 310-320 mg.  Vitamin K. This vitamin is found in green leafy vegetables. The recommended daily intake is 120 mg for adult men and 90 mg for adult women.   What type of physical activity is best for building and maintaining healthy bones? Weight-bearing and strength-building activities are important for building and maintaining healthy bones. Weight-bearing activities cause muscles and bones to work against gravity. Strength-building activities increase the strength of the muscles that support bones. Weight-bearing and muscle-building activities include:  Walking and hiking.  Jogging and running.  Dancing.  Gym exercises.  Lifting weights.  Tennis and racquetball.  Climbing stairs.  Aerobics. Adults should get at least 30 minutes of moderate physical activity on most days. Children should get at least 60 minutes of moderate physical activity on most days. Ask your health care provider what type of exercise is best for you.   How can I find out if my bone mass is low? Bone mass can be measured with an X-ray test called a bone mineral density (BMD) test. This test is recommended for all women who are age 65 or older. It may also be recommended for:  Men who are age 70 or older.  People who are at risk for osteoporosis because of: ? Having bones that break easily. ? Having a long-term disease that weakens bones, such as kidney disease or  rheumatoid arthritis. ? Having menopause earlier than normal. ? Taking medicine that weakens bones, such as steroids, thyroid hormones, or hormone treatment for breast cancer or prostate cancer. ? Smoking. ? Drinking three or more alcoholic drinks a day. If you find that you have a low bone mass, you may be able to prevent osteoporosis or further bone loss by changing your diet and lifestyle. Where can I find more information? For more information, check out the following websites:  National Osteoporosis Foundation: www.nof.org/patients  National Institutes of Health: www.bones.nih.gov  International Osteoporosis Foundation: www.iofbonehealth.org Summary  The aging process leads to an overall loss of bone mass in the body, which can increase the likelihood of broken bones and osteoporosis.  Eating a well-balanced diet with plenty of calcium and vitamin D will help to protect your bones.  Weight-bearing and strength-building activities are also important for building and maintaining strong bones.  Bone mass can be measured with an X-ray test called a bone mineral density (BMD) test. This information is not intended to replace advice given to you by your health care provider. Make sure you discuss any questions you have with your health care provider. Document Revised: 11/12/2017 Document Reviewed: 11/12/2017 Elsevier Patient Education  2021 [employer].   

## 2020-12-17 LAB — CYTOLOGY - PAP
Comment: NEGATIVE
Diagnosis: NEGATIVE
High risk HPV: NEGATIVE

## 2020-12-17 LAB — SURGICAL PATHOLOGY

## 2020-12-26 ENCOUNTER — Other Ambulatory Visit: Payer: Self-pay | Admitting: Nurse Practitioner

## 2020-12-29 ENCOUNTER — Encounter: Payer: Self-pay | Admitting: Nurse Practitioner

## 2020-12-29 ENCOUNTER — Other Ambulatory Visit: Payer: Self-pay

## 2020-12-29 ENCOUNTER — Ambulatory Visit (INDEPENDENT_AMBULATORY_CARE_PROVIDER_SITE_OTHER): Payer: 59 | Admitting: Nurse Practitioner

## 2020-12-29 VITALS — BP 104/68 | HR 61 | Temp 97.8°F | Wt 194.0 lb

## 2020-12-29 DIAGNOSIS — M170 Bilateral primary osteoarthritis of knee: Secondary | ICD-10-CM | POA: Diagnosis not present

## 2020-12-29 DIAGNOSIS — M25561 Pain in right knee: Secondary | ICD-10-CM

## 2020-12-29 MED ORDER — TRIAMCINOLONE ACETONIDE 40 MG/ML IJ SUSP
40.0000 mg | Freq: Once | INTRAMUSCULAR | Status: AC
Start: 2020-12-29 — End: 2020-12-29
  Administered 2020-12-29: 40 mg via INTRAMUSCULAR

## 2020-12-29 NOTE — Progress Notes (Signed)
BP 104/68   Pulse 61   Temp 97.8 F (36.6 C) (Oral)   Wt 194 lb (88 kg)   LMP  (LMP Unknown)   SpO2 96%   BMI 32.79 kg/m    Subjective:    Patient ID: Leslie Turner, female    DOB: [birth year], 65 y.o.   MRN: [id number]  HPI: Leslie Turner is a 65 y.o. female  Chief Complaint  Patient presents with  . Knee Pain    Patient states she has been having some pain in her knees. Patient states it feels like a grasp when she does have the pain and then when she is using one leg to compensate for the other is when she will have pain in both knees.   KNEE PAIN Last knee injection was in 12/26/19 -- right knee.  Has known OA noted on past imaging.  Reports today she feels the left one hurts because she is compensating for right.  At this time R>L.  Has done physical therapy in past without much benefit. Duration: weeks Involved knee: bilateral Mechanism of injury: unknown Location:diffuse Onset: gradual Severity: 5/10  Quality: dull and aching with occasional sharp Frequency: intermittent Radiation: no Aggravating factors: weight bearing, walking, stairs, bending and movement  Alleviating factors: Aleeve Status: fluctuating Treatments attempted: rest and aleve  Relief with NSAIDs?:  mild Weakness with weight bearing or walking: yes Sensation of giving way: yes Locking: yes Popping: no Bruising: no Swelling: no Redness: no Paresthesias/decreased sensation: no Fevers: no  Relevant past medical, surgical, family and social history reviewed and updated as indicated. Interim medical history since our last visit reviewed. Allergies and medications reviewed and updated.  Review of Systems  Constitutional: Negative for activity change, appetite change, diaphoresis, fatigue and fever.  Respiratory: Negative for cough, chest tightness and shortness of breath.   Cardiovascular: Negative for chest pain, palpitations and leg swelling.  Gastrointestinal: Negative.    Musculoskeletal: Positive for arthralgias.  Neurological: Negative.   Psychiatric/Behavioral: Negative.    Per HPI unless specifically indicated above     Objective:    BP 104/68   Pulse 61   Temp 97.8 F (36.6 C) (Oral)   Wt 194 lb (88 kg)   LMP  (LMP Unknown)   SpO2 96%   BMI 32.79 kg/m   Wt Readings from Last 3 Encounters:  12/29/20 194 lb (88 kg)  12/14/20 193 lb (87.5 kg)  08/10/20 188 lb 9.6 oz (85.5 kg)    Physical Exam Vitals and nursing note reviewed.  Constitutional:      General: She is awake. She is not in acute distress.    Appearance: She is well-developed, well-groomed and overweight. She is not ill-appearing.  HENT:     Head: Normocephalic.     Right Ear: Hearing normal.     Left Ear: Hearing normal.  Eyes:     General: Lids are normal.        Right eye: No discharge.        Left eye: No discharge.     Conjunctiva/sclera: Conjunctivae normal.     Pupils: Pupils are equal, round, and reactive to light.  Neck:     Thyroid: No thyromegaly.     Vascular: No carotid bruit.  Cardiovascular:     Rate and Rhythm: Normal rate and regular rhythm.     Heart sounds: Normal heart sounds. No murmur heard. No gallop.   Pulmonary:     Effort: Pulmonary effort is normal. No  accessory muscle usage or respiratory distress.     Breath sounds: Normal breath sounds.  Abdominal:     General: Bowel sounds are normal.     Palpations: Abdomen is soft.  Musculoskeletal:     Cervical back: Normal range of motion and neck supple.     Right knee: Crepitus present. No swelling, effusion, erythema or ecchymosis. Decreased range of motion. Tenderness present over the medial joint line. Normal pulse.     Left knee: Crepitus present. No swelling, effusion, erythema or ecchymosis. Decreased range of motion. No tenderness. Normal pulse.     Right lower leg: No edema.     Left lower leg: No edema.  Skin:    General: Skin is warm and dry.  Neurological:     Mental Status: She  is alert and oriented to person, place, and time.  Psychiatric:        Attention and Perception: Attention normal.        Mood and Affect: Mood normal.        Behavior: Behavior normal. Behavior is cooperative.        Thought Content: Thought content normal.        Judgment: Judgment normal.   STEROID INJECTION  Procedure: Knee Intraarticular Steroid Injection (RIGHT)  Description: After verbal consent and patient education on procedure area prepped and draped using semi-sterile technique. Using a anterior  approach, a mixture of 4 cc of  1% Marcaine & 1 cc of Kenalog 40 was injected into knee joint.  A bandage was then placed over the injection site. Complications:  none Post Procedure Instructions: To the ER if any symptoms of erythema or swelling.   Follow Up: PRN  Results for orders placed or performed in visit on 12/14/20  Surgical pathology  Result Value Ref Range   SURGICAL PATHOLOGY      SURGICAL PATHOLOGY CASE: [id number] PATIENT: Rosio Garrelts Surgical Pathology Report     Clinical History: cervical cancer screening, cervical lesion (cm)     FINAL MICROSCOPIC DIAGNOSIS:  A. CERVIX, 12 O'CLOCK, BIOPSY: - Benign endocervical polyp with nabothian cyst. - No dysplasia or malignancy.   GROSS DESCRIPTION:  Received in formalin are tan-white soft tissue fragments that are submitted in toto. Number: 2.  Size: 0.4 and 0.6 cm.  Blocks: 1  SW 12/16/2020    Final Diagnosis performed by Claudette Laws, MD.   Electronically signed 12/17/2020 Technical component performed at [facility]. [facility], [place] [address] [place], [place] [address].  Professional component performed at [facility], [place] [address] [place], [place] [address].  Immunohistochemistry Technical component (if applicable) was performed at [facility]. [address] [place], [place], [place] [address].   IMMUNOHISTOCHEMISTRY [place] (if  applicable): Some of these immunohistochemical stains may have been developed and the performance characteristics determine by [facility]. Some may not have been cleared or approved by the U.S. Food and Drug Administration. The FDA has determined that such clearance or approval is not necessary. This test is used for clinical purposes. It should not be regarded as investigational or for research. This laboratory is certified under the [place] (CLIA-88) as qualified to perform high complexity clinical laboratory testing.  The controls stained appropriately.   Cytology - PAP  Result Value Ref Range   High risk HPV Negative    Adequacy      Satisfactory for evaluation; transformation zone component PRESENT.   Diagnosis      -  Negative for intraepithelial lesion or malignancy (NILM)   Comment Normal Reference Range HPV - Negative       Assessment & Plan:   Problem List Items Addressed This Visit      Musculoskeletal and Integument   Primary osteoarthritis of both knees    Chronic, ongoing.  Has received steroid injections to right knee in past with ortho and is very knowledgeable of procedure and risks/benefits, further discussed today.  Verbal consent given and injection performed to right knee.  Recommend she ice area every couple hours for 24 hours.  Continue simple treatment at home, OTC creams.  Recommend focus on exercise regimen and modest weight loss.  Return in 6 weeks and will perform injection to left knee if needed.  Consider return to ortho or physical if ongoing pain.       Other Visit Diagnoses    Right knee pain, unspecified chronicity    -  Primary   Relevant Medications   triamcinolone acetonide (KENALOG-40) injection 40 mg (Completed) (Start on 12/29/2020 10:00 AM)       Follow up plan: Return if symptoms worsen or fail to improve.

## 2020-12-29 NOTE — Assessment & Plan Note (Signed)
Chronic, ongoing.  Has received steroid injections to right knee in past with ortho and is very knowledgeable of procedure and risks/benefits, further discussed today.  Verbal consent given and injection performed to right knee.  Recommend she ice area every couple hours for 24 hours.  Continue simple treatment at home, OTC creams.  Recommend focus on exercise regimen and modest weight loss.  Return in 6 weeks and will perform injection to left knee if needed.  Consider return to ortho or physical if ongoing pain.

## 2020-12-29 NOTE — Patient Instructions (Signed)
Knee Injection A knee injection is a procedure to get medicine into your knee joint to relieve the pain, swelling, and stiffness of arthritis. Your health care provider uses a needle to inject medicine, which may also help to lubricate and cushion your knee joint. You may need more than one injection. Tell a health care provider about:  Any allergies you have.  All medicines you are taking, including vitamins, herbs, eye drops, creams, and over-the-counter medicines.  Any problems you or family members have had with anesthetic medicines.  Any blood disorders you have.  Any surgeries you have had.  Any medical conditions you have.  Whether you are pregnant or may be pregnant. What are the risks? Generally, this is a safe procedure. However, problems may occur, including:  Infection.  Bleeding.  Symptoms that get worse.  Damage to the area around your knee.  Allergic reaction to any of the medicines.  Skin reactions from repeated injections. What happens before the procedure?  Ask your health care provider about: ? Changing or stopping your regular medicines. This is especially important if you are taking diabetes medicines or blood thinners. ? Taking medicines such as aspirin and ibuprofen. These medicines can thin your blood. Do not take these medicines unless your health care provider tells you to take them. ? Taking over-the-counter medicines, vitamins, herbs, and supplements.  Plan to have a responsible adult take you home from the hospital or clinic. What happens during the procedure?  You will sit or lie down in a position for your knee to be treated.  The skin over your kneecap will be cleaned with a germ-killing soap.  You will be given a medicine that numbs the area (local anesthetic). You may feel some stinging.  The medicine will be injected into your knee. The needle is carefully placed between your kneecap and your knee. The medicine is injected into the  joint space.  The needle will be removed at the end of the procedure.  A bandage (dressing) may be placed over the injection site. The procedure may vary among health care providers and hospitals.   What can I expect after the procedure?  Your blood pressure, heart rate, breathing rate, and blood oxygen level will be monitored until you leave the hospital or clinic.  You may have to move your knee through its full range of motion. This helps to get all the medicine into your joint space.  You will be watched to make sure that you do not have a reaction to the injected medicine.  You may feel more pain, swelling, and warmth than you did before the injection. This reaction may last about 1-2 days. Follow these instructions at home: Medicines  Take over-the-counter and prescription medicines only as told by your health care provider.  Ask your health care provider if the medicine prescribed to you requires you to avoid driving or using machinery.  Do not take medicines such as aspirin and ibuprofen unless your health care provider tells you to take them. Injection site care  Follow instructions from your health care provider about: ? How to take care of your puncture site. ? When and how you should change your dressing. ? When you should remove your dressing.  Check your injection area every day for signs of infection. Check for: ? More redness, swelling, or pain after 2 days. ? Fluid or blood. ? Pus or a bad smell. ? Warmth. Managing pain, stiffness, and swelling  If directed, put ice on   the injection area. To do this: ? Put ice in a plastic bag. ? Place a towel between your skin and the bag. ? Leave the ice on for 20 minutes, 2-3 times per day. ? Remove the ice if your skin turns bright red. This is very important. If you cannot feel pain, heat, or cold, you have a greater risk of damage to the area.  Do not apply heat to your knee.  Raise (elevate) the injection area  above the level of your heart while you are sitting or lying down.   General instructions  If you were given a dressing, keep it dry until your health care provider says it can be removed. Ask your health care provider when you can start showering or bathing.  Avoid strenuous activities for as long as directed by your health care provider. Ask your health care provider when you can return to your normal activities.  Keep all follow-up visits. This is important. You may need more injections. Contact a health care provider if you have:  A fever.  Warmth in your injection area.  Fluid, blood, or pus coming from your injection site.  Symptoms at your injection site that last longer than 2 days after your procedure. Get help right away if:  Your knee turns very red.  Your knee becomes very swollen.  Your knee is in severe pain. Summary  A knee injection is a procedure to get medicine into your knee joint to relieve the pain, swelling, and stiffness of arthritis.  A needle is carefully placed between your kneecap and your knee to inject medicine into the joint space.  Before the procedure, ask your health care provider about changing or stopping your regular medicines, especially if you are taking diabetes medicines or blood thinners.  Contact your health care provider if you have any problems or questions after your procedure. This information is not intended to replace advice given to you by your health care provider. Make sure you discuss any questions you have with your health care provider. Document Revised: 03/31/2020 Document Reviewed: 03/31/2020 Elsevier Patient Education  2021 Elsevier Inc.  

## 2021-01-08 ENCOUNTER — Other Ambulatory Visit: Payer: Self-pay | Admitting: Nurse Practitioner

## 2021-01-08 DIAGNOSIS — I10 Essential (primary) hypertension: Secondary | ICD-10-CM

## 2021-01-09 NOTE — Telephone Encounter (Signed)
Requested Prescriptions  Pending Prescriptions Disp Refills  . hydrochlorothiazide (HYDRODIURIL) 25 MG tablet [Pharmacy Med Name: hydroCHLOROthiazide 25 MG Oral Tablet] 45 tablet 3    Sig: TAKE ONE-HALF TABLET BY  MOUTH DAILY     Cardiovascular: Diuretics - Thiazide Passed - 01/08/2021  9:46 PM      Passed - Ca in normal range and within 360 days    Calcium  Date Value Ref Range Status  08/10/2020 9.2 8.7 - 10.3 mg/dL Final         Passed - Cr in normal range and within 360 days    Creatinine, Ser  Date Value Ref Range Status  08/10/2020 0.82 0.57 - 1.00 mg/dL Final         Passed - K in normal range and within 360 days    Potassium  Date Value Ref Range Status  08/10/2020 3.6 3.5 - 5.2 mmol/L Final         Passed - Na in normal range and within 360 days    Sodium  Date Value Ref Range Status  08/10/2020 138 134 - 144 mmol/L Final         Passed - Last BP in normal range    BP Readings from Last 1 Encounters:  12/29/20 104/68         Passed - Valid encounter within last 6 months    Recent Outpatient Visits          1 week ago Right knee pain, unspecified chronicity   [place], [place] T, NP   5 months ago Chronic depression   [place], [place] T, NP   11 months ago Chronic depression   [place], [place] T, NP   1 year ago Primary osteoarthritis of both knees   [place], [place] T, NP   1 year ago Generalized anxiety disorder   [place], Barbaraann Faster, NP      Future Appointments            In 1 month Cannady, Barbaraann Faster, NP [facility]

## 2021-01-23 ENCOUNTER — Other Ambulatory Visit: Payer: Self-pay | Admitting: Nurse Practitioner

## 2021-01-24 NOTE — Telephone Encounter (Signed)
Requested medication (s) are due for refill today: yes  Requested medication (s) are on the active medication list: yes  Last refill:  2/4/20222  Future visit scheduled: yes  Notes to clinic:  Patient has appointment on 02/08/2021 Should have enough medication until that time    Requested Prescriptions  Pending Prescriptions Disp Refills   losartan (COZAAR) 50 MG tablet [Pharmacy Med Name: Losartan Potassium 50 MG Oral Tablet] 90 tablet 3    Sig: TAKE 1 TABLET BY MOUTH  DAILY      Cardiovascular:  Angiotensin Receptor Blockers Passed - 01/23/2021  9:51 PM      Passed - Cr in normal range and within 180 days    Creatinine, Ser  Date Value Ref Range Status  08/10/2020 0.82 0.57 - 1.00 mg/dL Final          Passed - K in normal range and within 180 days    Potassium  Date Value Ref Range Status  08/10/2020 3.6 3.5 - 5.2 mmol/L Final          Passed - Patient is not pregnant      Passed - Last BP in normal range    BP Readings from Last 1 Encounters:  12/29/20 104/68          Passed - Valid encounter within last 6 months    Recent Outpatient Visits           3 weeks ago Right knee pain, unspecified chronicity   [place], [place] T, NP   5 months ago Chronic depression   [place], [place] T, NP   1 year ago Chronic depression   [place], [place] T, NP   1 year ago Primary osteoarthritis of both knees   [place], [place] T, NP   1 year ago Generalized anxiety disorder   [place], [place] T, NP       Future Appointments             In 2 weeks Cannady, Barbaraann Faster, NP [facility]               atorvastatin (LIPITOR) 40 MG tablet [Pharmacy Med Name: Atorvastatin Calcium 40 MG Oral Tablet] 90 tablet 3    Sig: TAKE 1 TABLET BY MOUTH  DAILY      Cardiovascular:  Antilipid - Statins Failed - 01/23/2021  9:51 PM      Failed - LDL in  normal range and within 360 days    LDL Chol Calc (NIH)  Date Value Ref Range Status  08/10/2020 92 0 - 99 mg/dL Final          Passed - Total Cholesterol in normal range and within 360 days    Cholesterol, Total  Date Value Ref Range Status  08/10/2020 168 100 - 199 mg/dL Final   Cholesterol Piccolo, Waived  Date Value Ref Range Status  08/09/2015 168 <200 mg/dL Final    Comment:                            Desirable                <200                         Borderline High      200- 239  High                     >239           Passed - HDL in normal range and within 360 days    HDL  Date Value Ref Range Status  08/10/2020 58 >39 mg/dL Final          Passed - Triglycerides in normal range and within 360 days    Triglycerides  Date Value Ref Range Status  08/10/2020 101 0 - 149 mg/dL Final   Triglycerides Piccolo,Waived  Date Value Ref Range Status  08/09/2015 245 (H) <150 mg/dL Final    Comment:                            Normal                   <150                         Borderline High     150 - 199                         High                200 - 499                         Very High                >499           Passed - Patient is not pregnant      Passed - Valid encounter within last 12 months    Recent Outpatient Visits           3 weeks ago Right knee pain, unspecified chronicity   [place], [place] T, NP   5 months ago Chronic depression   [place], [place] T, NP   1 year ago Chronic depression   [place], [place] T, NP   1 year ago Primary osteoarthritis of both knees   [place], [place] T, NP   1 year ago Generalized anxiety disorder   [place], Barbaraann Faster, NP       Future Appointments             In 2 weeks Cannady, Barbaraann Faster, NP [facility]

## 2021-02-08 ENCOUNTER — Other Ambulatory Visit: Payer: Self-pay

## 2021-02-08 ENCOUNTER — Ambulatory Visit (INDEPENDENT_AMBULATORY_CARE_PROVIDER_SITE_OTHER): Payer: 59 | Admitting: Nurse Practitioner

## 2021-02-08 ENCOUNTER — Other Ambulatory Visit: Payer: Self-pay | Admitting: Nurse Practitioner

## 2021-02-08 ENCOUNTER — Encounter: Payer: Self-pay | Admitting: Nurse Practitioner

## 2021-02-08 VITALS — BP 114/75 | HR 60 | Temp 98.6°F | Ht 64.5 in | Wt 189.8 lb

## 2021-02-08 DIAGNOSIS — R399 Unspecified symptoms and signs involving the genitourinary system: Secondary | ICD-10-CM | POA: Insufficient documentation

## 2021-02-08 DIAGNOSIS — E039 Hypothyroidism, unspecified: Secondary | ICD-10-CM | POA: Diagnosis not present

## 2021-02-08 DIAGNOSIS — E559 Vitamin D deficiency, unspecified: Secondary | ICD-10-CM

## 2021-02-08 DIAGNOSIS — F32A Depression, unspecified: Secondary | ICD-10-CM | POA: Diagnosis not present

## 2021-02-08 DIAGNOSIS — E7800 Pure hypercholesterolemia, unspecified: Secondary | ICD-10-CM | POA: Diagnosis not present

## 2021-02-08 DIAGNOSIS — I10 Essential (primary) hypertension: Secondary | ICD-10-CM

## 2021-02-08 DIAGNOSIS — F411 Generalized anxiety disorder: Secondary | ICD-10-CM

## 2021-02-08 LAB — MICROSCOPIC EXAMINATION
Bacteria, UA: NONE SEEN
WBC, UA: NONE SEEN /hpf (ref 0–5)

## 2021-02-08 LAB — URINALYSIS, ROUTINE W REFLEX MICROSCOPIC
Bilirubin, UA: NEGATIVE
Glucose, UA: NEGATIVE
Ketones, UA: NEGATIVE
Leukocytes,UA: NEGATIVE
Nitrite, UA: NEGATIVE
Specific Gravity, UA: 1.015 (ref 1.005–1.030)
Urobilinogen, Ur: 0.2 mg/dL (ref 0.2–1.0)
pH, UA: 7.5 (ref 5.0–7.5)

## 2021-02-08 LAB — WET PREP FOR TRICH, YEAST, CLUE
Clue Cell Exam: POSITIVE — AB
Trichomonas Exam: NEGATIVE
Yeast Exam: NEGATIVE

## 2021-02-08 MED ORDER — LOSARTAN POTASSIUM 50 MG PO TABS: 1.0000 | ORAL_TABLET | Freq: Every day | ORAL | 4 refills | Status: DC

## 2021-02-08 MED ORDER — SERTRALINE HCL 100 MG PO TABS: 1.0000 | ORAL_TABLET | Freq: Every day | ORAL | 4 refills | Status: DC

## 2021-02-08 MED ORDER — HYDROCHLOROTHIAZIDE 25 MG PO TABS: 0.5000 | ORAL_TABLET | Freq: Every day | ORAL | 4 refills | Status: DC

## 2021-02-08 MED ORDER — ATORVASTATIN CALCIUM 40 MG PO TABS: 1.0000 | ORAL_TABLET | Freq: Every day | ORAL | 4 refills | Status: DC

## 2021-02-08 MED ORDER — LEVOTHYROXINE SODIUM 112 MCG PO TABS: 112.0000 ug | ORAL_TABLET | Freq: Every day | ORAL | 4 refills | Status: DC

## 2021-02-08 NOTE — Assessment & Plan Note (Signed)
Chronic, with improvement, she has been reducing Sertraline dose on own as tolerated.  Continue Sertraline 100 MG and Vistaril as needed (using very minimally).  Denies SI/HI.  Return to office in 6 months.

## 2021-02-08 NOTE — Assessment & Plan Note (Signed)
Chronic, stable with reduction in dose.  Denies SI/HI.  Continue current medication regimen and adjust as needed, continue to reduce as tolerated.  Check LFTs today.

## 2021-02-08 NOTE — Assessment & Plan Note (Signed)
Chronic, ongoing.  Continue daily supplement and check Vit D level today.

## 2021-02-08 NOTE — Progress Notes (Signed)
BP 114/75   Pulse 60   Temp 98.6 F (37 C) (Oral)   Ht 5' 4.5" (1.638 m)   Wt 189 lb 12.8 oz (86.1 kg)   LMP  (LMP Unknown)   SpO2 97%   BMI 32.08 kg/m    Subjective:    Patient ID: Leslie Turner, female    DOB: May 25, 1956, 65 y.o.   MRN: [id number]  HPI: Leslie Turner is a 65 y.o. female  Chief Complaint  Patient presents with  . Hypertension  . Hypothyroidism  . Hyperlipidemia  . Depression   HYPERTENSION / HYPERLIPIDEMIA Continues on HCTZ 12.5 MG, Losartan 50 MG, and Lipitor 40 MG. Satisfied with current treatment? yes Duration of hypertension: chronic BP monitoring frequency: not checking BP range: not checking BP medication side effects: no Duration of hyperlipidemia: chronic Cholesterol medication side effects: no Cholesterol supplements: none Medication compliance: good compliance Aspirin: yes Recent stressors: no Recurrent headaches: no Visual changes: no Palpitations: no Dyspnea: no Chest pain: no Lower extremity edema: no Dizzy/lightheaded: no   URINARY SYMPTOMS Has noticed more frequency over past month or so.   Dysuria: no Urinary frequency: yes -- varies between day and night Urgency: no Small volume voids: no Symptom severity: no Urinary incontinence: no Foul odor: a little bit -- has had since she had biopsy Hematuria: no Abdominal pain: no Back pain: no Suprapubic pain/pressure: no Flank pain: no Fever: non Vomiting: no Status: stable Previous urinary tract infection: yes Recurrent urinary tract infection: no Sexual activity: monogomous History of sexually transmitted disease: no Treatments attempted: increasing fluids   HYPOTHYROIDISM Continues Levothyroxine 112 MCG -- TSH in March 2021 was 1.820.  Has underlying Vit D deficiency and takes daily supplement for this = 19.6. Thyroid control status:stable Satisfied with current treatment? no Medication side effects: no Medication compliance: good compliance Etiology of  hypothyroidism:  Recent dose adjustment:no Fatigue: no Cold intolerance: no Heat intolerance: no Weight gain: no Weight loss: no Constipation: no Diarrhea/loose stools: no Palpitations: no Lower extremity edema: no Anxiety/depressed mood: no   DEPRESSION Currently taking Sertraline 100 MG.  Her LFTs were elevated on past labs with Sertraline and Lipitor on board.  No Tylenol or heavy alcohol use at home.  Last levels in March 2021 = 47/49. Mood status: stable Satisfied with current treatment?: yes Symptom severity: mild  Duration of current treatment : chronic Side effects: no Medication compliance: good compliance Psychotherapy/counseling: none Previous psychiatric medications: Sertraline, Celexa Depressed mood: no Anxious mood: no Anhedonia: no Significant weight loss or gain: no Insomnia: none Fatigue: no Feelings of worthlessness or guilt: no Impaired concentration/indecisiveness: no Suicidal ideations: no Hopelessness: no Crying spells: no Depression screen [facility] 2/9 02/08/2021 12/14/2020 08/10/2020 01/23/2020 07/23/2019  Decreased Interest 0 0 0 0 0  Down, Depressed, Hopeless 0 0 0 0 0  PHQ - 2 Score 0 0 0 0 0  Altered sleeping 0 0 3 0 0  Tired, decreased energy 0 0 0 1 0  Change in appetite 0 0 0 0 0  Feeling bad or failure about yourself  0 0 0 0 0  Trouble concentrating 0 0 0 0 0  Moving slowly or fidgety/restless 0 0 0 0 0  Suicidal thoughts 0 0 0 0 0  PHQ-9 Score 0 0 3 1 0  Difficult doing work/chores Not difficult at all - Not difficult at all Not difficult at all Not difficult at all  Some recent data might be hidden   GAD 7 :  Generalized Anxiety Score 02/08/2021 12/14/2020 01/23/2020 07/23/2019  Nervous, Anxious, on Edge 0 0 0 0  Control/stop worrying 0 0 0 0  Worry too much - different things 0 0 0 0  Trouble relaxing 0 0 0 0  Restless 0 0 0 0  Easily annoyed or irritable 0 0 0 0  Afraid - awful might happen 0 0 0 0  Total GAD 7 Score 0 0 0 0  Anxiety  Difficulty Not difficult at all - Not difficult at all Not difficult at all   Relevant past medical, surgical, family and social history reviewed and updated as indicated. Interim medical history since our last visit reviewed. Allergies and medications reviewed and updated.  Review of Systems  Constitutional: Negative for activity change, appetite change, diaphoresis, fatigue and fever.  Respiratory: Negative for cough, chest tightness and shortness of breath.   Cardiovascular: Negative for chest pain, palpitations and leg swelling.  Gastrointestinal: Negative.   Endocrine: Negative for cold intolerance and heat intolerance.  Neurological: Negative.   Psychiatric/Behavioral: Negative for decreased concentration, self-injury, sleep disturbance and suicidal ideas. The patient is not nervous/anxious.     Per HPI unless specifically indicated above     Objective:    BP 114/75   Pulse 60   Temp 98.6 F (37 C) (Oral)   Ht 5' 4.5" (1.638 m)   Wt 189 lb 12.8 oz (86.1 kg)   LMP  (LMP Unknown)   SpO2 97%   BMI 32.08 kg/m   Wt Readings from Last 3 Encounters:  02/08/21 189 lb 12.8 oz (86.1 kg)  12/29/20 194 lb (88 kg)  12/14/20 193 lb (87.5 kg)    Physical Exam Vitals and nursing note reviewed.  Constitutional:      General: She is awake. She is not in acute distress.    Appearance: She is well-developed, well-groomed and overweight. She is not ill-appearing.  HENT:     Head: Normocephalic.     Right Ear: Hearing normal.     Left Ear: Hearing normal.  Eyes:     General: Lids are normal.        Right eye: No discharge.        Left eye: No discharge.     Conjunctiva/sclera: Conjunctivae normal.     Pupils: Pupils are equal, round, and reactive to light.  Neck:     Thyroid: No thyromegaly.     Vascular: No carotid bruit.  Cardiovascular:     Rate and Rhythm: Normal rate and regular rhythm.     Heart sounds: Normal heart sounds. No murmur heard. No gallop.   Pulmonary:      Effort: Pulmonary effort is normal. No accessory muscle usage or respiratory distress.     Breath sounds: Normal breath sounds.  Abdominal:     General: Bowel sounds are normal.     Palpations: Abdomen is soft.  Musculoskeletal:     Cervical back: Normal range of motion and neck supple.     Right lower leg: No edema.     Left lower leg: No edema.  Skin:    General: Skin is warm and dry.  Neurological:     Mental Status: She is alert and oriented to person, place, and time.  Psychiatric:        Attention and Perception: Attention normal.        Mood and Affect: Mood normal.        Behavior: Behavior normal. Behavior is cooperative.  Thought Content: Thought content normal.        Judgment: Judgment normal.    Results for orders placed or performed in visit on 12/14/20  Surgical pathology  Result Value Ref Range   SURGICAL PATHOLOGY      SURGICAL PATHOLOGY CASE: [id number] PATIENT: Catera Tarlton Surgical Pathology Report     Clinical History: cervical cancer screening, cervical lesion (cm)     FINAL MICROSCOPIC DIAGNOSIS:  A. CERVIX, 12 O'CLOCK, BIOPSY: - Benign endocervical polyp with nabothian cyst. - No dysplasia or malignancy.   GROSS DESCRIPTION:  Received in formalin are tan-white soft tissue fragments that are submitted in toto. Number: 2.  Size: 0.4 and 0.6 cm.  Blocks: 1  SW 12/16/2020    Final Diagnosis performed by Claudette Laws, MD.   Electronically signed 12/17/2020 Technical component performed at [facility]. [facility], [place] [address] [place], [place] [address].  Professional component performed at [facility], [place] [address] [place], [place] [address].  Immunohistochemistry Technical component (if applicable) was performed at [facility]. [address] [place], [place], [place] [address].   IMMUNOHISTOCHEMISTRY [place] (if applicable): Some of these immunohistochemical stains may  have been developed and the performance characteristics determine by [facility]. Some may not have been cleared or approved by the U.S. Food and Drug Administration. The FDA has determined that such clearance or approval is not necessary. This test is used for clinical purposes. It should not be regarded as investigational or for research. This laboratory is certified under the [facility] (CLIA-88) as qualified to perform high complexity clinical laboratory testing.  The controls stained appropriately.   Cytology - PAP  Result Value Ref Range   High risk HPV Negative    Adequacy      Satisfactory for evaluation; transformation zone component PRESENT.   Diagnosis      - Negative for intraepithelial lesion or malignancy (NILM)   Comment Normal Reference Range HPV - Negative       Assessment & Plan:   Problem List Items Addressed This Visit      Cardiovascular and Mediastinum   Essential hypertension, benign - Primary    Chronic, stable with BP below goal.  Continue current medication regimen and adjust as needed.  Obtain CMP and TSH + CBC today.  Recommend checking BP at home three days a week + focus on DASH diet, could consider reduction of medication in future.  Return in 6 months.      Relevant Medications   atorvastatin (LIPITOR) 40 MG tablet   hydrochlorothiazide (HYDRODIURIL) 25 MG tablet   losartan (COZAAR) 50 MG tablet   Other Relevant Orders   CBC with Differential/Platelet   Comprehensive metabolic panel     Endocrine   Hypothyroidism    Chronic, stable.  Continue current medication regimen and adjust as needed.  TSH and Free T4 today.      Relevant Medications   levothyroxine (SYNTHROID) 112 MCG tablet   Other Relevant Orders   TSH   T4, free     Other   Chronic depression    Chronic, stable with reduction in dose.  Denies SI/HI.  Continue current medication regimen and adjust as needed, continue to  reduce as tolerated.  Check LFTs today.        Relevant Medications   sertraline (ZOLOFT) 100 MG tablet   Generalized anxiety disorder    Chronic, with improvement, she has been reducing Sertraline  dose on own as tolerated.  Continue Sertraline 100 MG and Vistaril as needed (using very minimally).  Denies SI/HI.  Return to office in 6 months.      Relevant Medications   sertraline (ZOLOFT) 100 MG tablet   Hyperlipidemia    Chronic, stable.  Continue current medication regimen and adjust as needed.  Obtain CMP and lipid panel today.      Relevant Medications   atorvastatin (LIPITOR) 40 MG tablet   hydrochlorothiazide (HYDRODIURIL) 25 MG tablet   losartan (COZAAR) 50 MG tablet   Other Relevant Orders   Lipid Panel w/o Chol/HDL Ratio   Vitamin D deficiency    Chronic, ongoing.  Continue daily supplement and check Vit D level today.      Relevant Orders   VITAMIN D 25 Hydroxy (Vit-D Deficiency, Fractures)   Urinary symptom or sign    Acute noted over past month, obtain wet prep and UA today, treat as needed based on findings.  If normal findings, could consider possible OAB -- may required physical therapy for pelvic floor.  Will determine further needs after results return.      Relevant Orders   Urinalysis, Routine w reflex microscopic   WET PREP FOR TRICH, YEAST, CLUE       Follow up plan: Return in about 6 months (around 08/10/2021) for HTN/HLD, THYROID, MOOD.

## 2021-02-08 NOTE — Assessment & Plan Note (Signed)
Chronic, stable with BP below goal.  Continue current medication regimen and adjust as needed.  Obtain CMP and TSH + CBC today.  Recommend checking BP at home three days a week + focus on DASH diet, could consider reduction of medication in future.  Return in 6 months.

## 2021-02-08 NOTE — Patient Instructions (Signed)

## 2021-02-08 NOTE — Assessment & Plan Note (Signed)
Acute noted over past month, obtain wet prep and UA today, treat as needed based on findings.  If normal findings, could consider possible OAB -- may required physical therapy for pelvic floor.  Will determine further needs after results return.

## 2021-02-08 NOTE — Assessment & Plan Note (Signed)
Chronic, stable.  Continue current medication regimen and adjust as needed.  TSH and Free T4 today.

## 2021-02-08 NOTE — Assessment & Plan Note (Signed)
Chronic, stable.  Continue current medication regimen and adjust as needed.  Obtain CMP and lipid panel today. 

## 2021-02-09 ENCOUNTER — Other Ambulatory Visit: Payer: Self-pay | Admitting: Nurse Practitioner

## 2021-02-09 LAB — CBC WITH DIFFERENTIAL/PLATELET
Basophils Absolute: 0 10*3/uL (ref 0.0–0.2)
Basos: 1 %
EOS (ABSOLUTE): 0.2 10*3/uL (ref 0.0–0.4)
Eos: 3 %
Hematocrit: 45 % (ref 34.0–46.6)
Hemoglobin: 14.9 g/dL (ref 11.1–15.9)
Immature Grans (Abs): 0 10*3/uL (ref 0.0–0.1)
Immature Granulocytes: 0 %
Lymphocytes Absolute: 2.4 10*3/uL (ref 0.7–3.1)
Lymphs: 37 %
MCH: 30.8 pg (ref 26.6–33.0)
MCHC: 33.1 g/dL (ref 31.5–35.7)
MCV: 93 fL (ref 79–97)
Monocytes Absolute: 0.5 10*3/uL (ref 0.1–0.9)
Monocytes: 8 %
Neutrophils Absolute: 3.4 10*3/uL (ref 1.4–7.0)
Neutrophils: 51 %
Platelets: 269 10*3/uL (ref 150–450)
RBC: 4.84 x10E6/uL (ref 3.77–5.28)
RDW: 13.1 % (ref 11.7–15.4)
WBC: 6.5 10*3/uL (ref 3.4–10.8)

## 2021-02-09 LAB — COMPREHENSIVE METABOLIC PANEL
ALT: 25 IU/L (ref 0–32)
AST: 26 IU/L (ref 0–40)
Albumin/Globulin Ratio: 1.6 (ref 1.2–2.2)
Albumin: 4.2 g/dL (ref 3.8–4.8)
Alkaline Phosphatase: 96 IU/L (ref 44–121)
BUN/Creatinine Ratio: 14 (ref 12–28)
BUN: 13 mg/dL (ref 8–27)
Bilirubin Total: 0.7 mg/dL (ref 0.0–1.2)
CO2: 25 mmol/L (ref 20–29)
Calcium: 9.8 mg/dL (ref 8.7–10.3)
Chloride: 99 mmol/L (ref 96–106)
Creatinine, Ser: 0.96 mg/dL (ref 0.57–1.00)
Globulin, Total: 2.7 g/dL (ref 1.5–4.5)
Glucose: 111 mg/dL — ABNORMAL HIGH (ref 65–99)
Potassium: 3.6 mmol/L (ref 3.5–5.2)
Sodium: 140 mmol/L (ref 134–144)
Total Protein: 6.9 g/dL (ref 6.0–8.5)
eGFR: 66 mL/min/{1.73_m2} (ref >59)

## 2021-02-09 LAB — LIPID PANEL W/O CHOL/HDL RATIO
Cholesterol, Total: 176 mg/dL (ref 100–199)
HDL: 63 mg/dL (ref >39)
LDL Chol Calc (NIH): 96 mg/dL (ref 0–99)
Triglycerides: 91 mg/dL (ref 0–149)
VLDL Cholesterol Cal: 17 mg/dL (ref 5–40)

## 2021-02-09 LAB — VITAMIN D 25 HYDROXY (VIT D DEFICIENCY, FRACTURES): Vit D, 25-Hydroxy: 25.8 ng/mL — ABNORMAL LOW (ref 30.0–100.0)

## 2021-02-09 LAB — T4, FREE: Free T4: 1.55 ng/dL (ref 0.82–1.77)

## 2021-02-09 LAB — TSH: TSH: 2.88 u[IU]/mL (ref 0.450–4.500)

## 2021-02-09 MED ORDER — METRONIDAZOLE 500 MG PO TABS: 500.0000 mg | ORAL_TABLET | Freq: Two times a day (BID) | ORAL | 0 refills | Status: AC

## 2021-02-09 NOTE — Progress Notes (Signed)
Flagyl for BV infection

## 2021-02-09 NOTE — Progress Notes (Signed)
Contacted via [place] morning Joanna, your labs have returned: - thyroid remains in normal range -- TSH and Free T4 -- continue current Levothyroxine dose and we will recheck in one year unless you start have symptoms present - Cholesterol levels look stable, continue Atorvastatin 40 MG, in future would like to see LDL <70 for stroke prevention, would could consider increase in Atorvastatin in future to 80 MG, will continue to monitor. - Vitamin D remains slightly on lower side, I would increase your daily Vitamin D3 to 2000 units for bone health.  When you turn 65 we will discuss starting bone density scans to assess for osteopenia or osteoporosis. - Kidney (creatinine and eGFR) and liver function (AST/ALT) stable, glucose is mildly elevated, monitor your sugar intake.  In fact liver function is now normal, which is wonderful news. - Urine showed a little blood but no signs of infection.  Your vaginal swab did return + for clue cells, meaning bacterial vaginosis.  This is a common finding in females, especially as we age when the vagina becomes more dry.  It is not sexually transmitted.  I will send in 7 days of Flagyl for you to take twice a day for this.  Do not drink alcohol with Flagyl and take it with food.  It can have a slight metallic taste.  Any questions? Keep being awesome!!  Thank you for allowing me to participate in your care.  It is always a pleasure to see you. Kindest regards, Merick Kelleher

## 2021-02-14 NOTE — Progress Notes (Signed)
Patient did not view MyChart Message, please let know following: Good morning Yevonne, your labs have returned: - thyroid remains in normal range -- TSH and Free T4 -- continue current Levothyroxine dose and we will recheck in one year unless you start have symptoms present - Cholesterol levels look stable, continue Atorvastatin 40 MG, in future would like to see LDL <70 for stroke prevention, would could consider increase in Atorvastatin in future to 80 MG, will continue to monitor. - Vitamin D remains slightly on lower side, I would increase your daily Vitamin D3 to 2000 units for bone health. When you turn 65 we will discuss starting bone density scans to assess for osteopenia or osteoporosis. - Kidney (creatinine and eGFR) and liver function (AST/ALT) stable, glucose is mildly elevated, monitor your sugar intake. In fact liver function is now normal, which is wonderful news. - Urine showed a little blood but no signs of infection. Your vaginal swab did return + for clue cells, meaning bacterial vaginosis. This is a common finding in females, especially as we age when the vagina becomes more dry. It is not sexually transmitted. I will send in 7 days of Flagyl for you to take twice a day for this. Do not drink alcohol with Flagyl and take it with food. It can have a slight metallic taste. Any questions? Keep being awesome!! Thank you for allowing me to participate in your care. It is always a pleasure to see you. Kindest regards, Kindra Bickham

## 2021-03-19 ENCOUNTER — Other Ambulatory Visit: Payer: Self-pay | Admitting: Nurse Practitioner

## 2021-03-20 NOTE — Telephone Encounter (Signed)
Wants filled mail order Requested Prescriptions  Pending Prescriptions Disp Refills  . sertraline (ZOLOFT) 100 MG tablet [Pharmacy Med Name: Sertraline HCl 100 MG Oral Tablet] 90 tablet 3    Sig: TAKE 1 TABLET BY MOUTH  DAILY     Psychiatry:  Antidepressants - SSRI Passed - 03/19/2021 10:11 PM      Passed - Completed PHQ-2 or PHQ-9 in the last 360 days      Passed - Valid encounter within last 6 months    Recent Outpatient Visits          1 month ago Essential hypertension, benign   [place], [place] T, NP   2 months ago Right knee pain, unspecified chronicity   [place], [place] T, NP   7 months ago Chronic depression   [place], [place] T, NP   1 year ago Chronic depression   [place], [place] T, NP   1 year ago Primary osteoarthritis of both knees   [place], Barbaraann Faster, NP      Future Appointments            In 4 months Cann[place], Barbaraann Faster, NP [facility]           . levothyroxine (SYNTHROID) 112 MCG tablet [Pharmacy Med Name: Levothyroxine Sodium 112 MCG Oral Tablet] 90 tablet 3    Sig: TAKE 1 TABLET BY MOUTH  DAILY BEFORE BREAKFAST     Endocrinology:  Hypothyroid Agents Failed - 03/19/2021 10:11 PM      Failed - TSH needs to be rechecked within 3 months after an abnormal result. Refill until TSH is due.      Passed - TSH in normal range and within 360 days    TSH  Date Value Ref Range Status  02/08/2021 2.880 0.450 - 4.500 uIU/mL Final         Passed - Valid encounter within last 12 months    Recent Outpatient Visits          1 month ago Essential hypertension, benign   [place], [place] T, NP   2 months ago Right knee pain, unspecified chronicity   [place], [place] T, NP   7 months ago Chronic depression   [place], [place] T, NP   1 year ago Chronic depression    [place], [place] T, NP   1 year ago Primary osteoarthritis of both knees   [place], Barbaraann Faster, NP      Future Appointments            In 4 months Cannady, Barbaraann Faster, NP [facility]

## 2021-06-22 ENCOUNTER — Encounter: Payer: Self-pay | Admitting: Nurse Practitioner

## 2021-06-22 ENCOUNTER — Ambulatory Visit (INDEPENDENT_AMBULATORY_CARE_PROVIDER_SITE_OTHER): Payer: 59 | Admitting: Nurse Practitioner

## 2021-06-22 ENCOUNTER — Other Ambulatory Visit: Payer: Self-pay

## 2021-06-22 DIAGNOSIS — M170 Bilateral primary osteoarthritis of knee: Secondary | ICD-10-CM | POA: Diagnosis not present

## 2021-06-22 MED ORDER — CELECOXIB 50 MG PO CAPS: 50.0000 mg | ORAL_CAPSULE | Freq: Every day | ORAL | 1 refills | Status: DC | PRN

## 2021-06-22 NOTE — Assessment & Plan Note (Signed)
Chronic, ongoing.  Has received steroid injections to right knee in past and is very knowledgeable of procedure and risks/benefits, further discussed today.  Verbal consent given and injection performed to left knee.  Recommend she ice area every couple hours for 24 hours.  Continue simple treatment at home, OTC creams.  Will send in script for Celecoxib to use as needed only daily.  Recommend focus on exercise regimen and modest weight loss.  Return as scheduled in October, sooner if worsening pain.  Recommend ortho referral, but she wishes to wait until new year.

## 2021-06-22 NOTE — Patient Instructions (Signed)
Joint Steroid Injection A joint steroid injection is a procedure to relieve swelling and pain in a joint. Steroids are medicines that reduce inflammation. In this procedure, your health care provider uses a syringe and a needle to inject a steroid medicine into a painful and inflamed joint. A pain-relieving medicine (anesthetic) may be injected along with the steroid. In some cases, your health care provider may use an imaging technique such as ultrasound or fluoroscopy toguide the injection. Joints that are often treated with steroid injections include the knee, shoulder, hip, and spine. These injections may also be used in the elbow, ankle, and joints of the hands or feet. You may have joint steroid injections as part of your treatment for inflammation caused by: Gout. Rheumatoid arthritis. Advanced wear-and-tear arthritis (osteoarthritis). Tendinitis. Bursitis. Joint steroid injections may be repeated, but having them too often can damage a joint or the skin over the joint. You should not have joint steroidinjections less than 6 weeks apart or more than four times a year. Tell a health care provider about: Any allergies you have. All medicines you are taking, including vitamins, herbs, eye drops, creams, and over-the-counter medicines. Any problems you or family members have had with anesthetic medicines. Any blood disorders you have. Any surgeries you have had. Any medical conditions you have. Whether you are pregnant or may be pregnant. What are the risks? Generally, this is a safe treatment. However, problems may occur, including: Infection. Bleeding. Allergic reactions to medicines. Damage to the joint or tissues around the joint. Thinning of skin or loss of skin color over the joint. Temporary flushing of the face or chest. Temporary increase in pain. Temporary increase in blood sugar. Failure to relieve inflammation or pain. What happens before the treatment? Medicines Ask  your health care provider about: Changing or stopping your regular medicines. This is especially important if you are taking diabetes medicines or blood thinners. Taking medicines such as aspirin and ibuprofen. These medicines can thin your blood. Do not take these medicines unless your health care provider tells you to take them. Taking over-the-counter medicines, vitamins, herbs, and supplements. General instructions You may have imaging tests of your joint. Ask your health care provider if you can drive yourself home after the procedure. What happens during the treatment?  Your health care provider will position you for the injection and locate the injection site over your joint. The skin over the joint will be cleaned with a germ-killing soap. Your health care provider may: Spray a numbing solution (topical anesthetic) over the injection site. Inject a local anesthetic under the skin above your joint. The needle will be placed through your skin into your joint. Your health care provider may use imaging to guide the needle to the right spot for the injection. If imaging is used, a special contrast dye may be injected to confirm that the needle is in the correct location. The steroid medicine will be injected into your joint. Anesthetic may be injected along with the steroid. This may be a medicine that relieves pain for a short time (short-acting anesthetic) or for a longer time (long-acting anesthetic). The needle will be removed, and an adhesive bandage (dressing) will be placed over the injection site. The procedure may vary among health care providers and hospitals. What can I expect after the treatment? You will be able to go home after the treatment. It is normal to feel slight flushing for a few days after the injection. After the treatment, it is common to   have an increase in joint pain after the anesthetic has worn off. This may happen about an hour after a short-acting anesthetic  or about 8 hours after a longer-acting anesthetic. You should begin to feel relief from joint pain and swelling after 24 to 48 hours. Contact your health care provider if you do not begin to feel relief after 2 days. Follow these instructions at home: Injection site care Leave the adhesive dressing over your injection site in place until your health care provider says you can remove it. Check your injection site every day for signs of infection. Check for: More redness, swelling, or pain. Fluid or blood. Warmth. Pus or a bad smell. Activity Return to your normal activities as told by your health care provider. Ask your health care provider what activities are safe for you. You may be asked to limit activities that put stress on the joint for a few days. Do joint exercises as told by your health care provider. Do not take baths, swim, or use a hot tub until your health care provider approves. Ask your health care provider if you may take showers. You may only be allowed to take sponge baths. Managing pain, stiffness, and swelling  If directed, put ice on the joint. To do this: Put ice in a plastic bag. Place a towel between your skin and the bag. Leave the ice on for 20 minutes, 2-3 times a day. Remove the ice if your skin turns bright red. This is very important. If you cannot feel pain, heat, or cold, you have a greater risk of damage to the area. Raise (elevate) your joint above the level of your heart when you are sitting or lying down.  General instructions Take over-the-counter and prescription medicines only as told by your health care provider. Do not use any products that contain nicotine or tobacco, such as cigarettes, e-cigarettes, and chewing tobacco. These can delay joint healing. If you need help quitting, ask your health care provider. If you have diabetes, be aware that your blood sugar may be slightly elevated for several days after the injection. Keep all follow-up visits.  This is important. Contact a health care provider if you have: Chills or a fever. Any signs of infection at your injection site. Increased pain or swelling or no relief after 2 days. Summary A joint steroid injection is a treatment to relieve pain and swelling in a joint. Steroids are medicines that reduce inflammation. Your health care provider may add an anesthetic along with the steroid. You may have joint steroid injections as part of your arthritis treatment. Joint steroid injections may be repeated, but having them too often can damage a joint or the skin over the joint. Contact your health care provider if you have a fever, chills, or signs of infection, or if you get no relief from joint pain or swelling. This information is not intended to replace advice given to you by your health care provider. Make sure you discuss any questions you have with your healthcare provider. Document Revised: 03/26/2020 Document Reviewed: 03/26/2020 Elsevier Patient Education  2022 [employer]  

## 2021-06-22 NOTE — Progress Notes (Signed)
BP 102/64   Pulse (!) 56   Temp 98.2 F (36.8 C) (Oral)   Ht 5' 4.5" (1.638 m)   Wt 191 lb 3.2 oz (86.7 kg)   LMP  (LMP Unknown)   SpO2 96%   BMI 32.31 kg/m    Subjective:    Patient ID: Leslie Turner, female    DOB: December 08, 1955, 65 y.o.   MRN: [id number]  HPI: Leslie Turner is a 65 y.o. female  Chief Complaint  Patient presents with   Knee Pain    Patient states she has been dealing with knee pain in her right knee and was treated with an injection. Patient states she has been dealing with this and states she is here for an injection. Patient states at night time she has a pain that is located in one spot at the bottom of her patella.    KNEE PAIN (LEFT) Presents for left knee pain.  Last knee injection was in 12/29/20 to right knee, prior was in February 2021.  Has known OA right side noted on past imaging.  Reports today she feels the left one hurts.  At this time L>R.  Has done physical therapy in past without much benefit.  Right knee is still bothering her on occasion. Knees are worse after prolonged sitting and getting up in morning.  Injections tend to last a year. Duration: weeks Involved knee: bilateral Mechanism of injury: unknown Location:diffuse Onset: gradual Severity: 6/10 today Quality: dull and aching with occasional sharp Frequency: intermittent Radiation: no Aggravating factors: weight bearing, walking, stairs, bending and movement  Alleviating factors: Aleeve Status: fluctuating Treatments attempted: rest and aleve, Voltaren and stretching, Tylenol Relief with NSAIDs?:  mild Weakness with weight bearing or walking: yes Sensation of giving way: yes Locking: yes Popping: at times Bruising: no Swelling: no Redness: no Paresthesias/decreased sensation: no Fevers: no  Relevant past medical, surgical, family and social history reviewed and updated as indicated. Interim medical history since our last visit reviewed. Allergies and medications  reviewed and updated.  Review of Systems  Constitutional:  Negative for activity change, appetite change, diaphoresis, fatigue and fever.  Respiratory:  Negative for cough, chest tightness and shortness of breath.   Cardiovascular:  Negative for chest pain, palpitations and leg swelling.  Gastrointestinal: Negative.   Musculoskeletal:  Positive for arthralgias.  Neurological: Negative.   Psychiatric/Behavioral: Negative.     Per HPI unless specifically indicated above     Objective:    BP 102/64   Pulse (!) 56   Temp 98.2 F (36.8 C) (Oral)   Ht 5' 4.5" (1.638 m)   Wt 191 lb 3.2 oz (86.7 kg)   LMP  (LMP Unknown)   SpO2 96%   BMI 32.31 kg/m   Wt Readings from Last 3 Encounters:  06/22/21 191 lb 3.2 oz (86.7 kg)  02/08/21 189 lb 12.8 oz (86.1 kg)  12/29/20 194 lb (88 kg)    Physical Exam Vitals and nursing note reviewed.  Constitutional:      General: She is awake. She is not in acute distress.    Appearance: She is well-developed, well-groomed and overweight. She is not ill-appearing.  HENT:     Head: Normocephalic.     Right Ear: Hearing normal.     Left Ear: Hearing normal.  Eyes:     General: Lids are normal.        Right eye: No discharge.        Left eye: No discharge.  Conjunctiva/sclera: Conjunctivae normal.     Pupils: Pupils are equal, round, and reactive to light.  Neck:     Thyroid: No thyromegaly.     Vascular: No carotid bruit.  Cardiovascular:     Rate and Rhythm: Normal rate and regular rhythm.     Heart sounds: Normal heart sounds. No murmur heard.   No gallop.  Pulmonary:     Effort: Pulmonary effort is normal. No accessory muscle usage or respiratory distress.     Breath sounds: Normal breath sounds.  Abdominal:     General: Bowel sounds are normal.     Palpations: Abdomen is soft.  Musculoskeletal:     Cervical back: Normal range of motion and neck supple.     Right knee: Crepitus present. No swelling, effusion, erythema or ecchymosis.  Decreased range of motion. No tenderness. Normal pulse.     Left knee: Crepitus present. No swelling, effusion, erythema or ecchymosis. Decreased range of motion. Tenderness present over the medial joint line. Normal pulse.     Right lower leg: No edema.     Left lower leg: No edema.  Skin:    General: Skin is warm and dry.  Neurological:     Mental Status: She is alert and oriented to person, place, and time.  Psychiatric:        Attention and Perception: Attention normal.        Mood and Affect: Mood normal.        Behavior: Behavior normal. Behavior is cooperative.        Thought Content: Thought content normal.        Judgment: Judgment normal.   STEROID INJECTION  Procedure: Knee Intraarticular Steroid Injection (LEFT)  Description: After verbal consent and patient education on procedure area prepped and draped using semi-sterile technique. Using a anterior  approach, a mixture of 4 cc of  1% Marcaine & 1 cc of Kenalog 40 was injected into knee joint.  A bandage was then placed over the injection site. Complications:  none Post Procedure Instructions: To the ER if any symptoms of erythema or swelling.   Follow Up: PRN  Results for orders placed or performed in visit on 02/08/21  WET PREP FOR [place], YEAST, CLUE   Specimen: Vaginal Fluid   Vaginal Flui  Result Value Ref Range   Trichomonas Exam Negative Negative   Yeast Exam Negative Negative   Clue Cell Exam Positive (A) Negative  Microscopic Examination   Urine  Result Value Ref Range   WBC, UA None seen 0 - 5 /hpf   RBC 0-2 0 - 2 /hpf   Epithelial Cells (non renal) 0-10 0 - 10 /hpf   Casts Present (A) None seen /lpf   Cast Type Hyaline casts N/A   Mucus, UA Present (A) Not Estab.   Bacteria, UA None seen None seen/Few  CBC with Differential/Platelet  Result Value Ref Range   WBC 6.5 3.4 - 10.8 x10E3/uL   RBC 4.84 3.77 - 5.28 x10E6/uL   Hemoglobin 14.9 11.1 - 15.9 g/dL   Hematocrit 45.0 34.0 - 46.6 %   MCV 93 79 -  97 fL   MCH 30.8 26.6 - 33.0 pg   MCHC 33.1 31.5 - 35.7 g/dL   RDW 13.1 11.7 - 15.4 %   Platelets 269 150 - 450 x10E3/uL   Neutrophils 51 Not Estab. %   Lymphs 37 Not Estab. %   Monocytes 8 Not Estab. %   Eos 3 Not Estab. %  Basos 1 Not Estab. %   Neutrophils Absolute 3.4 1.4 - 7.0 x10E3/uL   Lymphocytes Absolute 2.4 0.7 - 3.1 x10E3/uL   Monocytes Absolute 0.5 0.1 - 0.9 x10E3/uL   EOS (ABSOLUTE) 0.2 0.0 - 0.4 x10E3/uL   Basophils Absolute 0.0 0.0 - 0.2 x10E3/uL   Immature Granulocytes 0 Not Estab. %   Immature Grans (Abs) 0.0 0.0 - 0.1 x10E3/uL  Comprehensive metabolic panel  Result Value Ref Range   Glucose 111 (H) 65 - 99 mg/dL   BUN 13 8 - 27 mg/dL   Creatinine, Ser 0.96 0.57 - 1.00 mg/dL   eGFR 66 >59 mL/min/1.73   BUN/Creatinine Ratio 14 12 - 28   Sodium 140 134 - 144 mmol/L   Potassium 3.6 3.5 - 5.2 mmol/L   Chloride 99 96 - 106 mmol/L   CO2 25 20 - 29 mmol/L   Calcium 9.8 8.7 - 10.3 mg/dL   Total Protein 6.9 6.0 - 8.5 g/dL   Albumin 4.2 3.8 - 4.8 g/dL   Globulin, Total 2.7 1.5 - 4.5 g/dL   Albumin/Globulin Ratio 1.6 1.2 - 2.2   Bilirubin Total 0.7 0.0 - 1.2 mg/dL   Alkaline Phosphatase 96 44 - 121 IU/L   AST 26 0 - 40 IU/L   ALT 25 0 - 32 IU/L  TSH  Result Value Ref Range   TSH 2.880 0.450 - 4.500 uIU/mL  Lipid Panel w/o Chol/HDL Ratio  Result Value Ref Range   Cholesterol, Total 176 100 - 199 mg/dL   Triglycerides 91 0 - 149 mg/dL   HDL 63 >39 mg/dL   VLDL Cholesterol Cal 17 5 - 40 mg/dL   LDL Chol Calc (NIH) 96 0 - 99 mg/dL  VITAMIN D 25 Hydroxy (Vit-D Deficiency, Fractures)  Result Value Ref Range   Vit D, 25-Hydroxy 25.8 (L) 30.0 - 100.0 ng/mL  T4, free  Result Value Ref Range   Free T4 1.55 0.82 - 1.77 ng/dL  Urinalysis, Routine w reflex microscopic  Result Value Ref Range   Specific Gravity, UA 1.015 1.005 - 1.030   pH, UA 7.5 5.0 - 7.5   Color, UA Yellow Yellow   Appearance Ur Clear Clear   Leukocytes,UA Negative Negative   Protein,UA Trace  (A) Negative/Trace   Glucose, UA Negative Negative   Ketones, UA Negative Negative   RBC, UA Trace (A) Negative   Bilirubin, UA Negative Negative   Urobilinogen, Ur 0.2 0.2 - 1.0 mg/dL   Nitrite, UA Negative Negative   Microscopic Examination See below:       Assessment & Plan:   Problem List Items Addressed This Visit       Musculoskeletal and Integument   Primary osteoarthritis of both knees    Chronic, ongoing.  Has received steroid injections to right knee in past and is very knowledgeable of procedure and risks/benefits, further discussed today.  Verbal consent given and injection performed to left knee.  Recommend she ice area every couple hours for 24 hours.  Continue simple treatment at home, OTC creams.  Will send in script for Celecoxib to use as needed only daily.  Recommend focus on exercise regimen and modest weight loss.  Return as scheduled in October, sooner if worsening pain.  Recommend ortho referral, but she wishes to wait until new year.      Relevant Medications   celecoxib (CELEBREX) 50 MG capsule     Follow up plan: Return if symptoms worsen or fail to improve.

## 2021-06-23 ENCOUNTER — Telehealth: Payer: Self-pay

## 2021-06-23 NOTE — Telephone Encounter (Signed)
Prior Authorization initiated via CoverMyMeds for Celebrex 50 mg.   KEY: [id number]

## 2021-07-18 MED ORDER — LOSARTAN POTASSIUM 50 MG PO TABS: 50.0000 mg | ORAL_TABLET | Freq: Every day | ORAL | 4 refills | Status: DC

## 2021-07-18 MED ORDER — ATORVASTATIN CALCIUM 40 MG PO TABS: 40.0000 mg | ORAL_TABLET | Freq: Every day | ORAL | 4 refills | Status: DC

## 2021-07-20 DIAGNOSIS — I10 Essential (primary) hypertension: Secondary | ICD-10-CM

## 2021-07-20 MED ORDER — HYDROCHLOROTHIAZIDE 25 MG PO TABS: 12.5000 mg | ORAL_TABLET | Freq: Every day | ORAL | 0 refills | Status: DC

## 2021-08-10 ENCOUNTER — Encounter: Payer: Self-pay | Admitting: Nurse Practitioner

## 2021-08-10 ENCOUNTER — Ambulatory Visit (INDEPENDENT_AMBULATORY_CARE_PROVIDER_SITE_OTHER): Payer: 59 | Admitting: Nurse Practitioner

## 2021-08-10 ENCOUNTER — Other Ambulatory Visit: Payer: Self-pay

## 2021-08-10 VITALS — BP 102/66 | HR 65 | Temp 98.5°F | Wt 192.4 lb

## 2021-08-10 DIAGNOSIS — Z23 Encounter for immunization: Secondary | ICD-10-CM

## 2021-08-10 DIAGNOSIS — E559 Vitamin D deficiency, unspecified: Secondary | ICD-10-CM | POA: Diagnosis not present

## 2021-08-10 DIAGNOSIS — I10 Essential (primary) hypertension: Secondary | ICD-10-CM

## 2021-08-10 DIAGNOSIS — F411 Generalized anxiety disorder: Secondary | ICD-10-CM

## 2021-08-10 DIAGNOSIS — F32A Depression, unspecified: Secondary | ICD-10-CM

## 2021-08-10 DIAGNOSIS — E7800 Pure hypercholesterolemia, unspecified: Secondary | ICD-10-CM

## 2021-08-10 DIAGNOSIS — E039 Hypothyroidism, unspecified: Secondary | ICD-10-CM

## 2021-08-10 MED ORDER — HYDROCHLOROTHIAZIDE 25 MG PO TABS: 12.5000 mg | ORAL_TABLET | Freq: Every day | ORAL | 0 refills | Status: DC

## 2021-08-10 MED ORDER — CELECOXIB 50 MG PO CAPS: 50.0000 mg | ORAL_CAPSULE | Freq: Every day | ORAL | 1 refills | Status: DC | PRN

## 2021-08-10 MED ORDER — LEVOTHYROXINE SODIUM 112 MCG PO TABS: 112.0000 ug | ORAL_TABLET | Freq: Every day | ORAL | 4 refills | Status: DC

## 2021-08-10 MED ORDER — HYDROCHLOROTHIAZIDE 25 MG PO TABS: 12.5000 mg | ORAL_TABLET | Freq: Every day | ORAL | 4 refills | Status: DC

## 2021-08-10 MED ORDER — ATORVASTATIN CALCIUM 40 MG PO TABS: 40.0000 mg | ORAL_TABLET | Freq: Every day | ORAL | 4 refills | Status: DC

## 2021-08-10 MED ORDER — SERTRALINE HCL 100 MG PO TABS: 100.0000 mg | ORAL_TABLET | Freq: Every day | ORAL | 4 refills | Status: DC

## 2021-08-10 MED ORDER — LOSARTAN POTASSIUM 50 MG PO TABS: 50.0000 mg | ORAL_TABLET | Freq: Every day | ORAL | 4 refills | Status: DC

## 2021-08-10 NOTE — Assessment & Plan Note (Signed)
Chronic ongoing, lipid panel in office today, continue medication regimen.

## 2021-08-10 NOTE — Assessment & Plan Note (Signed)
Chronic ongoing, no anxiety issues today. Continue with medication regimen.

## 2021-08-10 NOTE — Patient Instructions (Signed)

## 2021-08-10 NOTE — Assessment & Plan Note (Signed)
chronic ongoing, denies any depressed mood today, no SI/HI. Taking medication with relief.

## 2021-08-10 NOTE — Assessment & Plan Note (Signed)
Chronic ongoing, vitamin D level on 02/08/21 was 25.8. Will repeat in office today. Continue medication regimen.

## 2021-08-10 NOTE — Assessment & Plan Note (Addendum)
Chronic ongoing, taking levothyroxine which is helping. No issues today. To check thyroid antibody at next physical on 02/09/22.

## 2021-08-10 NOTE — Progress Notes (Signed)
BP 102/66   Pulse 65   Temp 98.5 F (36.9 C) (Oral)   Wt 192 lb 6.4 oz (87.3 kg)   LMP  (LMP Unknown)   SpO2 95%   BMI 32.52 kg/m    Subjective:    Patient ID: Leslie Turner, female    DOB: 03-05-1956, 65 y.o.   MRN: [id number]   Chief Complaint  Patient presents with   Hypertension   Hyperlipidemia   Mood   Hypothyroidism  HPI: Leslie Turner is a 65 y.o. female, here today for her routine follow up, no complaint today.  HYPERTENSION / HYPERLIPIDEMIA Satisfied with current treatment? yes Duration of hypertension: chronic BP monitoring frequency: not checking BP range:  BP medication side effects: no Past BP meds: HCTZ and losartan (cozaar) Duration of hyperlipidemia: chronic Cholesterol medication side effects: no Cholesterol supplements: none Past cholesterol medications: atorvastain (lipitor) Medication compliance: good compliance Aspirin: no Recent stressors: no Recurrent headaches: no Visual changes: no Palpitations: no Dyspnea: no Chest pain: no Lower extremity edema: no Dizzy/lightheaded: no   HYPOTHYROIDISM Taking medication as prescribed, no issues today. Thyroid control status:controlled Satisfied with current treatment? yes Medication side effects: no Medication compliance: good compliance Etiology of hypothyroidism:  Recent dose adjustment:no Fatigue: no Cold intolerance: no Heat intolerance: no Weight gain: no Weight loss: no Constipation: no Diarrhea/loose stools: no Palpitations: no Lower extremity edema: no Anxiety/depressed mood: no   ANXIETY/Depression Patient states she is doing good, denies SI/HI, taking medication which is helping. controlled Anxious mood: no  Excessive worrying: no Irritability: no  Sweating: no Nausea: no Palpitations:no Hyperventilation: no Panic attacks: no Agoraphobia: no  Obscessions/compulsions: no Depressed mood: no Depression screen [facility] 2/9 08/10/2021 02/08/2021 12/14/2020 08/10/2020  01/23/2020  Decreased Interest 0 0 0 0 0  Down, Depressed, Hopeless 0 0 0 0 0  PHQ - 2 Score 0 0 0 0 0  Altered sleeping 0 0 0 3 0  Tired, decreased energy 1 0 0 0 1  Change in appetite 0 0 0 0 0  Feeling bad or failure about yourself  0 0 0 0 0  Trouble concentrating 0 0 0 0 0  Moving slowly or fidgety/restless 0 0 0 0 0  Suicidal thoughts 0 0 0 0 0  PHQ-9 Score 1 0 0 3 1  Difficult doing work/chores Not difficult at all Not difficult at all - Not difficult at all Not difficult at all  Some recent data might be hidden   Anhedonia: no Weight changes: no Insomnia: no   Hypersomnia: no Fatigue/loss of energy: no Feelings of worthlessness: no Feelings of guilt: no Impaired concentration/indecisiveness: no Suicidal ideations: no  Crying spells: no Recent Stressors/Life Changes: no   Relationship problems: no   Family stress: no     Financial stress: no    Job stress: no    Recent death/loss: no  GAD 7 : Generalized Anxiety Score 08/10/2021 02/08/2021 12/14/2020 01/23/2020  Nervous, Anxious, on Edge 0 0 0 0  Control/stop worrying 0 0 0 0  Worry too much - different things 0 0 0 0  Trouble relaxing 0 0 0 0  Restless 0 0 0 0  Easily annoyed or irritable 0 0 0 0  Afraid - awful might happen 0 0 0 0  Total GAD 7 Score 0 0 0 0  Anxiety Difficulty Not difficult at all Not difficult at all - Not difficult at all    Relevant past medical, surgical, family and social history reviewed  and updated as indicated. Interim medical history since our last visit reviewed. Allergies and medications reviewed and updated.  Review of Systems  Constitutional:  Negative for appetite change, chills, fatigue and fever.  Respiratory:  Negative for chest tightness and shortness of breath.   Cardiovascular:  Negative for chest pain.  Gastrointestinal:  Negative for constipation, diarrhea, nausea and vomiting.  Endocrine: Negative for cold intolerance, heat intolerance, polydipsia, polyphagia and  polyuria.  Neurological:  Negative for dizziness, tremors and numbness.  Psychiatric/Behavioral:  Negative for sleep disturbance and suicidal ideas.    Per HPI unless specifically indicated above     Objective:    BP 102/66   Pulse 65   Temp 98.5 F (36.9 C) (Oral)   Wt 192 lb 6.4 oz (87.3 kg)   LMP  (LMP Unknown)   SpO2 95%   BMI 32.52 kg/m   Wt Readings from Last 3 Encounters:  08/10/21 192 lb 6.4 oz (87.3 kg)  06/22/21 191 lb 3.2 oz (86.7 kg)  02/08/21 189 lb 12.8 oz (86.1 kg)    Physical Exam Vitals and nursing note reviewed.  Constitutional:      General: She is not in acute distress.    Appearance: Normal appearance. She is obese. She is not ill-appearing or toxic-appearing.  HENT:     Head: Normocephalic.  Eyes:     General: Lids are normal.  Neck:     Thyroid: No thyroid mass, thyromegaly or thyroid tenderness.  Cardiovascular:     Rate and Rhythm: Normal rate and regular rhythm.     Heart sounds: Normal heart sounds, S1 normal and S2 normal.  Pulmonary:     Effort: Pulmonary effort is normal.     Breath sounds: Normal breath sounds and air entry.  Abdominal:     General: Bowel sounds are normal. There is no distension.     Palpations: Abdomen is soft. There is no mass.     Tenderness: There is no abdominal tenderness.  Lymphadenopathy:     Cervical: No cervical adenopathy.     Right cervical: No superficial or posterior cervical adenopathy.    Left cervical: No superficial or posterior cervical adenopathy.     Upper Body:     Right upper body: No supraclavicular adenopathy.     Left upper body: No supraclavicular adenopathy.  Neurological:     Mental Status: She is alert and oriented to person, place, and time.     Deep Tendon Reflexes: Reflexes are normal and symmetric.     Reflex Scores:      Patellar reflexes are 2+ on the right side and 2+ on the left side. Psychiatric:        Attention and Perception: Attention normal.        Mood and Affect:  Mood normal. Mood is not depressed.        Speech: Speech normal.        Behavior: Behavior normal. Behavior is cooperative.        Thought Content: Thought content normal.    Results for orders placed or performed in visit on 02/08/21  WET PREP FOR [place], YEAST, CLUE   Specimen: Vaginal Fluid   Vaginal Flui  Result Value Ref Range   Trichomonas Exam Negative Negative   Yeast Exam Negative Negative   Clue Cell Exam Positive (A) Negative  Microscopic Examination   Urine  Result Value Ref Range   WBC, UA None seen 0 - 5 /hpf   RBC 0-2 0 -  2 /hpf   Epithelial Cells (non renal) 0-10 0 - 10 /hpf   Casts Present (A) None seen /lpf   Cast Type Hyaline casts N/A   Mucus, UA Present (A) Not Estab.   Bacteria, UA None seen None seen/Few  CBC with Differential/Platelet  Result Value Ref Range   WBC 6.5 3.4 - 10.8 x10E3/uL   RBC 4.84 3.77 - 5.28 x10E6/uL   Hemoglobin 14.9 11.1 - 15.9 g/dL   Hematocrit 45.0 34.0 - 46.6 %   MCV 93 79 - 97 fL   MCH 30.8 26.6 - 33.0 pg   MCHC 33.1 31.5 - 35.7 g/dL   RDW 13.1 11.7 - 15.4 %   Platelets 269 150 - 450 x10E3/uL   Neutrophils 51 Not Estab. %   Lymphs 37 Not Estab. %   Monocytes 8 Not Estab. %   Eos 3 Not Estab. %   Basos 1 Not Estab. %   Neutrophils Absolute 3.4 1.4 - 7.0 x10E3/uL   Lymphocytes Absolute 2.4 0.7 - 3.1 x10E3/uL   Monocytes Absolute 0.5 0.1 - 0.9 x10E3/uL   EOS (ABSOLUTE) 0.2 0.0 - 0.4 x10E3/uL   Basophils Absolute 0.0 0.0 - 0.2 x10E3/uL   Immature Granulocytes 0 Not Estab. %   Immature Grans (Abs) 0.0 0.0 - 0.1 x10E3/uL  Comprehensive metabolic panel  Result Value Ref Range   Glucose 111 (H) 65 - 99 mg/dL   BUN 13 8 - 27 mg/dL   Creatinine, Ser 0.96 0.57 - 1.00 mg/dL   eGFR 66 >59 mL/min/1.73   BUN/Creatinine Ratio 14 12 - 28   Sodium 140 134 - 144 mmol/L   Potassium 3.6 3.5 - 5.2 mmol/L   Chloride 99 96 - 106 mmol/L   CO2 25 20 - 29 mmol/L   Calcium 9.8 8.7 - 10.3 mg/dL   Total Protein 6.9 6.0 - 8.5 g/dL    Albumin 4.2 3.8 - 4.8 g/dL   Globulin, Total 2.7 1.5 - 4.5 g/dL   Albumin/Globulin Ratio 1.6 1.2 - 2.2   Bilirubin Total 0.7 0.0 - 1.2 mg/dL   Alkaline Phosphatase 96 44 - 121 IU/L   AST 26 0 - 40 IU/L   ALT 25 0 - 32 IU/L  TSH  Result Value Ref Range   TSH 2.880 0.450 - 4.500 uIU/mL  Lipid Panel w/o Chol/HDL Ratio  Result Value Ref Range   Cholesterol, Total 176 100 - 199 mg/dL   Triglycerides 91 0 - 149 mg/dL   HDL 63 >39 mg/dL   VLDL Cholesterol Cal 17 5 - 40 mg/dL   LDL Chol Calc (NIH) 96 0 - 99 mg/dL  VITAMIN D 25 Hydroxy (Vit-D Deficiency, Fractures)  Result Value Ref Range   Vit D, 25-Hydroxy 25.8 (L) 30.0 - 100.0 ng/mL  T4, free  Result Value Ref Range   Free T4 1.55 0.82 - 1.77 ng/dL  Urinalysis, Routine w reflex microscopic  Result Value Ref Range   Specific Gravity, UA 1.015 1.005 - 1.030   pH, UA 7.5 5.0 - 7.5   Color, UA Yellow Yellow   Appearance Ur Clear Clear   Leukocytes,UA Negative Negative   Protein,UA Trace (A) Negative/Trace   Glucose, UA Negative Negative   Ketones, UA Negative Negative   RBC, UA Trace (A) Negative   Bilirubin, UA Negative Negative   Urobilinogen, Ur 0.2 0.2 - 1.0 mg/dL   Nitrite, UA Negative Negative   Microscopic Examination See below:       Assessment & Plan:  Problem List Items Addressed This Visit       Cardiovascular and Mediastinum   Essential hypertension, benign    Chronic ongoing, BP in office today at goal, to continue with medication as prescribed. Taking losartan to help with kidney protection. BMP in office today.       Relevant Orders   Basic metabolic panel     Endocrine   Hypothyroidism    Chronic ongoing, taking levothyroxine which is helping. No issues today. To check thyroid antibody at next physical on 02/09/22.        Other   Chronic depression    chronic ongoing, denies any depressed mood today, no SI/HI. Taking medication with relief.       Generalized anxiety disorder    Chronic ongoing,  no anxiety issues today. Continue with medication regimen.      Hyperlipidemia    Chronic ongoing, lipid panel in office today, continue medication regimen.      Relevant Orders   Lipid Panel w/o Chol/HDL Ratio   Vitamin D deficiency    Chronic ongoing, vitamin D level on 02/08/21 was 25.8. Will repeat in office today. Continue medication regimen.      Relevant Orders   VITAMIN D 25 Hydroxy (Vit-D Deficiency, Fractures)   Other Visit Diagnoses     Flu vaccine need    -  Primary   got in office today.   Relevant Orders   Flu Vaccine QUAD 6+ mos PF IM (Fluarix Quad PF) (Completed)   Need for Tdap vaccination       got in office today   Relevant Orders   Tdap vaccine greater than or equal to 7yo IM (Completed)        Follow up plan: Return in about 6 months (around 02/09/2022) for Annual physical after 02/09/22 -- need thyroid antibody test too.

## 2021-08-10 NOTE — Assessment & Plan Note (Addendum)
Chronic ongoing, BP in office today at goal, to continue with medication as prescribed. Taking losartan to help with kidney protection. BMP in office today.

## 2021-08-11 ENCOUNTER — Other Ambulatory Visit: Payer: Self-pay | Admitting: Nurse Practitioner

## 2021-08-11 DIAGNOSIS — I10 Essential (primary) hypertension: Secondary | ICD-10-CM

## 2021-08-11 DIAGNOSIS — E876 Hypokalemia: Secondary | ICD-10-CM

## 2021-08-11 LAB — LIPID PANEL W/O CHOL/HDL RATIO
Cholesterol, Total: 162 mg/dL (ref 100–199)
HDL: 59 mg/dL (ref >39)
LDL Chol Calc (NIH): 85 mg/dL (ref 0–99)
Triglycerides: 98 mg/dL (ref 0–149)
VLDL Cholesterol Cal: 18 mg/dL (ref 5–40)

## 2021-08-11 LAB — BASIC METABOLIC PANEL
BUN/Creatinine Ratio: 16 (ref 12–28)
BUN: 14 mg/dL (ref 8–27)
CO2: 23 mmol/L (ref 20–29)
Calcium: 9.1 mg/dL (ref 8.7–10.3)
Chloride: 101 mmol/L (ref 96–106)
Creatinine, Ser: 0.86 mg/dL (ref 0.57–1.00)
Glucose: 106 mg/dL — ABNORMAL HIGH (ref 70–99)
Potassium: 3.4 mmol/L — ABNORMAL LOW (ref 3.5–5.2)
Sodium: 139 mmol/L (ref 134–144)
eGFR: 75 mL/min/{1.73_m2} (ref >59)

## 2021-08-11 LAB — VITAMIN D 25 HYDROXY (VIT D DEFICIENCY, FRACTURES): Vit D, 25-Hydroxy: 31.1 ng/mL (ref 30.0–100.0)

## 2021-08-11 NOTE — Progress Notes (Signed)
Contacted via [place]  - needs lab only visit in 4 weeks please   Good morning Leslie Turner, again thank you for letting my student see you.  I appreciate that.  Your labs have returned and overall look fabulous with exception of mild elevation in glucose (sugar), monitor diet and lower and sweets or foods high in carbohydrates.  Potassium was a little low at 3.4, this may be related to hydrochlorothiazide.  I would like you to increase potassium rich foods in diet, such as bananas, avocados, spinach, sweet potatoes, mango.  I would like you to come in 4 weeks for lab visit only to recheck this and see if we need to adjust medication.  Any questions?  Please schedule lab only visit for 4 weeks.   Keep being inspiring!!  Thank you for allowing me to participate in your care.  I appreciate you. Kindest regards, Khing Belcher

## 2021-08-12 NOTE — Telephone Encounter (Addendum)
[facility] called and spoke to [place], [facility] about the refill(s) Hydrochlorothiazide 25 mg requested. Advised it was sent on 08/10/21 #15/4 refill(s). She says they have one on file from 08/10/21 #15/0 refills that she is able to get ready for pickup. Advised I will resend this request to have on file.   Requested Prescriptions  Pending Prescriptions Disp Refills   hydrochlorothiazide (HYDRODIURIL) 25 MG tablet [Pharmacy Med Name: HYDROCHLOROTHIAZIDE 25 MG TAB] 15 tablet 4    Sig: TAKE 1/2 TABLET BY MOUTH EVERY DAY     Cardiovascular: Diuretics - Thiazide Failed - 08/11/2021 12:30 PM      Failed - K in normal range and within 360 days    Potassium  Date Value Ref Range Status  08/10/2021 3.4 (L) 3.5 - 5.2 mmol/L Final          Passed - Ca in normal range and within 360 days    Calcium  Date Value Ref Range Status  08/10/2021 9.1 8.7 - 10.3 mg/dL Final          Passed - Cr in normal range and within 360 days    Creatinine, Ser  Date Value Ref Range Status  08/10/2021 0.86 0.57 - 1.00 mg/dL Final          Passed - Na in normal range and within 360 days    Sodium  Date Value Ref Range Status  08/10/2021 139 134 - 144 mmol/L Final          Passed - Last BP in normal range    BP Readings from Last 1 Encounters:  08/10/21 102/66          Passed - Valid encounter within last 6 months    Recent Outpatient Visits           2 days ago Flu vaccine need   [employer], Barbaraann Faster, NP   1 month ago Primary osteoarthritis of both knees   [place], [place] T, NP   6 months ago Essential hypertension, benign   [facility] [place], [place] T, NP   7 months ago Right knee pain, unspecified chronicity   [place], [place] T, NP   1 year ago Chronic depression   [place], Barbaraann Faster, NP       Future Appointments             In 6 months [place], Barbaraann Faster, NP [employer]

## 2021-09-05 ENCOUNTER — Other Ambulatory Visit: Payer: Self-pay | Admitting: Nurse Practitioner

## 2021-09-05 DIAGNOSIS — I10 Essential (primary) hypertension: Secondary | ICD-10-CM

## 2021-09-08 ENCOUNTER — Other Ambulatory Visit: Payer: 59

## 2021-09-15 ENCOUNTER — Telehealth: Payer: Self-pay | Admitting: Nurse Practitioner

## 2021-09-15 ENCOUNTER — Other Ambulatory Visit: Payer: Medicare Other

## 2021-09-15 ENCOUNTER — Other Ambulatory Visit: Payer: Self-pay

## 2021-09-15 DIAGNOSIS — E876 Hypokalemia: Secondary | ICD-10-CM

## 2021-09-15 NOTE — Telephone Encounter (Signed)
Copied from [place] [phone] [id number]. Topic: General - Other >> Sep 15, 2021 12:27 PM Celene Kras wrote: Reason for CRM: Estil Daft calling to speak with Ubaldo Glassing. She states that pts insurance should be listed as generic supplement option in epic to add Lucielle to the supplement plan. She states that this comes directly from the [facility] billing dept. Requesting to have a call back once this has been completed. Please advise.

## 2021-09-16 ENCOUNTER — Encounter: Payer: Self-pay | Admitting: Nurse Practitioner

## 2021-09-16 ENCOUNTER — Other Ambulatory Visit: Payer: Self-pay | Admitting: Nurse Practitioner

## 2021-09-16 DIAGNOSIS — I10 Essential (primary) hypertension: Secondary | ICD-10-CM

## 2021-09-16 LAB — POTASSIUM: Potassium: 3.7 mmol/L (ref 3.5–5.2)

## 2021-09-16 MED ORDER — LEVOTHYROXINE SODIUM 112 MCG PO TABS: 112.0000 ug | ORAL_TABLET | Freq: Every day | ORAL | 4 refills | Status: DC

## 2021-09-16 MED ORDER — HYDROCHLOROTHIAZIDE 25 MG PO TABS
12.5000 mg | ORAL_TABLET | Freq: Every day | ORAL | 4 refills | Status: DC
Start: 2021-09-16 — End: 2022-02-08

## 2021-09-16 MED ORDER — LEVOTHYROXINE SODIUM 112 MCG PO TABS
112.0000 ug | ORAL_TABLET | Freq: Every day | ORAL | 4 refills | Status: DC
Start: 2021-09-16 — End: 2022-02-08

## 2021-09-16 MED ORDER — HYDROCHLOROTHIAZIDE 25 MG PO TABS: 12.5000 mg | ORAL_TABLET | Freq: Every day | ORAL | 4 refills | Status: DC

## 2021-09-16 MED ORDER — LOSARTAN POTASSIUM 50 MG PO TABS: 50.0000 mg | ORAL_TABLET | Freq: Every day | ORAL | 4 refills | Status: DC

## 2021-09-16 MED ORDER — SERTRALINE HCL 100 MG PO TABS: 100.0000 mg | ORAL_TABLET | Freq: Every day | ORAL | 4 refills | Status: DC

## 2021-09-16 MED ORDER — CELECOXIB 50 MG PO CAPS: 50.0000 mg | ORAL_CAPSULE | Freq: Every day | ORAL | 2 refills | Status: DC | PRN

## 2021-09-16 MED ORDER — ATORVASTATIN CALCIUM 40 MG PO TABS: 40.0000 mg | ORAL_TABLET | Freq: Every day | ORAL | 4 refills | Status: DC

## 2021-09-16 NOTE — Progress Notes (Signed)
Contacted via [place] afternoon Juliya, your potassium returned normal this check.  Keep up the good work and all medications:) Keep being awesome!!  Thank you for allowing me to participate in your care.  I appreciate you. Kindest regards, Willer Osorno

## 2021-10-30 ENCOUNTER — Encounter: Payer: Self-pay | Admitting: Nurse Practitioner

## 2021-11-02 ENCOUNTER — Encounter: Payer: Self-pay | Admitting: Nurse Practitioner

## 2021-11-02 ENCOUNTER — Other Ambulatory Visit: Payer: Self-pay

## 2021-11-02 ENCOUNTER — Ambulatory Visit (INDEPENDENT_AMBULATORY_CARE_PROVIDER_SITE_OTHER): Payer: Medicare Other | Admitting: Nurse Practitioner

## 2021-11-02 VITALS — BP 110/73 | HR 58 | Temp 98.1°F | Wt 192.0 lb

## 2021-11-02 DIAGNOSIS — M5432 Sciatica, left side: Secondary | ICD-10-CM | POA: Diagnosis not present

## 2021-11-02 MED ORDER — PREDNISONE 10 MG PO TABS: ORAL_TABLET | ORAL | 0 refills | Status: DC

## 2021-11-02 NOTE — Progress Notes (Signed)
BP 110/73    Pulse (!) 58    Temp 98.1 F (36.7 C) (Oral)    Wt 192 lb (87.1 kg)    LMP  (LMP Unknown)    SpO2 97%    BMI 32.45 kg/m    Subjective:    Patient ID: Leslie Turner, female    DOB: 1956/03/18, 66 y.o.   MRN: [id number]  HPI: Leslie Turner is a 66 y.o. female  Chief Complaint  Patient presents with   Sciatica    Patient states about 3 weeks ago she noticed pain in her left buttock. Patient states she the pain radiating down her leg. Patient states she typically takes Ibuprofen to help, but it has not helped. Patient states she tried Aleve and states it helped her a little with the pain and discomfort.    BACK PAIN Started about 3 weeks ago, no falls or trauma at the time.  Has had similar before and has been doing stretches, without benefit.  Was doing well yesterday, until she drove to [facility] which irritated it. Tried Ibuprofen, but Aleeve helped more.   Duration: weeks Mechanism of injury: no trauma Location: L>R Onset: sudden Severity: 9/10 Quality: dull, aching, shooting, and throbbing Frequency: intermittent Radiation: L leg below the knee Aggravating factors: prolonged sitting  Alleviating factors:  Aleeve, Ibuprofen, ice Status: fluctuating Treatments attempted: ice, ibuprofen, and aleve  Relief with NSAIDs?: moderate Nighttime pain:  yes Paresthesias / decreased sensation:  no Bowel / bladder incontinence:  no Fevers:  no Dysuria / urinary frequency:  no   Relevant past medical, surgical, family and social history reviewed and updated as indicated. Interim medical history since our last visit reviewed. Allergies and medications reviewed and updated.  Review of Systems  Constitutional:  Negative for activity change, appetite change, diaphoresis, fatigue and fever.  Respiratory:  Negative for cough, chest tightness and shortness of breath.   Cardiovascular:  Negative for chest pain, palpitations and leg swelling.  Endocrine: Negative for cold  intolerance and heat intolerance.  Musculoskeletal:  Positive for back pain.  Neurological: Negative.   Psychiatric/Behavioral: Negative.     Per HPI unless specifically indicated above     Objective:    BP 110/73    Pulse (!) 58    Temp 98.1 F (36.7 C) (Oral)    Wt 192 lb (87.1 kg)    LMP  (LMP Unknown)    SpO2 97%    BMI 32.45 kg/m   Wt Readings from Last 3 Encounters:  11/02/21 192 lb (87.1 kg)  08/10/21 192 lb 6.4 oz (87.3 kg)  06/22/21 191 lb 3.2 oz (86.7 kg)    Physical Exam Vitals and nursing note reviewed.  Constitutional:      General: She is awake. She is not in acute distress.    Appearance: She is well-developed, well-groomed and overweight. She is not ill-appearing.  HENT:     Head: Normocephalic.     Right Ear: Hearing normal.     Left Ear: Hearing normal.  Eyes:     General: Lids are normal.        Right eye: No discharge.        Left eye: No discharge.     Conjunctiva/sclera: Conjunctivae normal.     Pupils: Pupils are equal, round, and reactive to light.  Neck:     Thyroid: No thyromegaly.     Vascular: No carotid bruit.  Cardiovascular:     Rate and Rhythm: Normal rate  and regular rhythm.     Heart sounds: Normal heart sounds. No murmur heard.   No gallop.  Pulmonary:     Effort: Pulmonary effort is normal. No accessory muscle usage or respiratory distress.     Breath sounds: Normal breath sounds.  Abdominal:     General: Bowel sounds are normal.     Palpations: Abdomen is soft.  Musculoskeletal:     Cervical back: Normal range of motion and neck supple.     Lumbar back: Tenderness (to left SI area) present. No swelling, spasms or bony tenderness. Normal range of motion. Negative right straight leg raise test and negative left straight leg raise test.     Right lower leg: No edema.     Left lower leg: No edema.  Skin:    General: Skin is warm and dry.  Neurological:     Mental Status: She is alert and oriented to person, place, and time.   Psychiatric:        Attention and Perception: Attention normal.        Mood and Affect: Mood normal.        Behavior: Behavior normal. Behavior is cooperative.        Thought Content: Thought content normal.        Judgment: Judgment normal.   Results for orders placed or performed in visit on 09/15/21  Potassium  Result Value Ref Range   Potassium 3.7 3.5 - 5.2 mmol/L      Assessment & Plan:   Problem List Items Addressed This Visit       Nervous and Auditory   Left sciatic nerve pain - Primary    Acute flare, chronic underlying though, has had similar before.  Will obtain imaging of lumbar spine to further assess.  At this time start Prednisone taper and recommend continued use of Aleeve only as needed.  Recommend Icy/Hot cream and alternating between heat and ice.  Offered PT referral, she will speak to her niece who is a PT and obtain stretches to perform at home.  Return in 3 weeks for follow-up.      Relevant Orders   DG Lumbar Spine Complete     Follow up plan: Return in 3 weeks (on 11/23/2021) for Back and Knee Pain.

## 2021-11-02 NOTE — Patient Instructions (Signed)
[facility] == [address], [place], [place] [address]  Sciatica Sciatica is pain, weakness, tingling, or loss of feeling (numbness) along the sciatic nerve. The sciatic nerve starts in the lower back and goes down the back of each leg. Sciatica usually goes away on its own or with treatment. Sometimes, sciatica may come back (recur). What are the causes? This condition happens when the sciatic nerve is pinched or has pressure put on it. This may be the result of: A disk in between the bones of the spine bulging out too far (herniated disk). Changes in the spinal disks that occur with aging. A condition that affects a muscle in the butt. Extra bone growth near the sciatic nerve. A break (fracture) of the area between your hip bones (pelvis). Pregnancy. Tumor. This is rare. What increases the risk? You are more likely to develop this condition if you: Play sports that put pressure or stress on the spine. Have poor strength and ease of movement (flexibility). Have had a back injury in the past. Have had back surgery. Sit for long periods of time. Do activities that involve bending or lifting over and over again. Are very overweight (obese). What are the signs or symptoms? Symptoms can vary from mild to very bad. They may include: Any of these problems in the lower back, leg, hip, or butt: Mild tingling, loss of feeling, or dull aches. Burning sensations. Sharp pains. Loss of feeling in the back of the calf or the sole of the foot. Leg weakness. Very bad back pain that makes it hard to move. These symptoms may get worse when you cough, sneeze, or laugh. They may also get worse when you sit or stand for long periods of time. How is this treated? This condition often gets better without any treatment. However, treatment may include: Changing or cutting back on physical activity when you have pain. Doing exercises and stretching. Putting ice or heat on the affected  area. Medicines that help: To relieve pain and swelling. To relax your muscles. Shots (injections) of medicines that help to relieve pain, irritation, and swelling. Surgery. Follow these instructions at home: Medicines Take over-the-counter and prescription medicines only as told by your doctor. Ask your doctor if the medicine prescribed to you: Requires you to avoid driving or using heavy machinery. Can cause trouble pooping (constipation). You may need to take these steps to prevent or treat trouble pooping: Drink enough fluids to keep your pee (urine) pale yellow. Take over-the-counter or prescription medicines. Eat foods that are high in fiber. These include beans, whole grains, and fresh fruits and vegetables. Limit foods that are high in fat and sugar. These include fried or sweet foods. Managing pain   If told, put ice on the affected area. Put ice in a plastic bag. Place a towel between your skin and the bag. Leave the ice on for 20 minutes, 2-3 times a day. If told, put heat on the affected area. Use the heat source that your doctor tells you to use, such as a moist heat pack or a heating pad. Place a towel between your skin and the heat source. Leave the heat on for 20-30 minutes. Remove the heat if your skin turns bright red. This is very important if you are unable to feel pain, heat, or cold. You may have a greater risk of getting burned. Activity  Return to your normal activities as told by your doctor. Ask your doctor what activities are safe for  you. Avoid activities that make your symptoms worse. Take short rests during the day. When you rest for a long time, do some physical activity or stretching between periods of rest. Avoid sitting for a long time without moving. Get up and move around at least one time each hour. Exercise and stretch regularly, as told by your doctor. Do not lift anything that is heavier than 10 lb (4.5 kg) while you have symptoms of  sciatica. Avoid lifting heavy things even when you do not have symptoms. Avoid lifting heavy things over and over. When you lift objects, always lift in a way that is safe for your body. To do this, you should: Bend your knees. Keep the object close to your body. Avoid twisting. General instructions Stay at a healthy weight. Wear comfortable shoes that support your feet. Avoid wearing high heels. Avoid sleeping on a mattress that is too soft or too hard. You might have less pain if you sleep on a mattress that is firm enough to support your back. Keep all follow-up visits as told by your doctor. This is important. Contact a doctor if: You have pain that: Wakes you up when you are sleeping. Gets worse when you lie down. Is worse than the pain you have had in the past. Lasts longer than 4 weeks. You lose weight without trying. Get help right away if: You cannot control when you pee (urinate) or poop (have a bowel movement). You have weakness in any of these areas and it gets worse: Lower back. The area between your hip bones. Butt. Legs. You have redness or swelling of your back. You have a burning feeling when you pee. Summary Sciatica is pain, weakness, tingling, or loss of feeling (numbness) along the sciatic nerve. This condition happens when the sciatic nerve is pinched or has pressure put on it. Sciatica can cause pain, tingling, or loss of feeling (numbness) in the lower back, legs, hips, and butt. Treatment often includes rest, exercise, medicines, and putting ice or heat on the affected area. This information is not intended to replace advice given to you by your health care provider. Make sure you discuss any questions you have with your health care provider. Document Revised: 11/04/2018 Document Reviewed: 11/04/2018 Elsevier Patient Education  [place].

## 2021-11-02 NOTE — Assessment & Plan Note (Signed)
Acute flare, chronic underlying though, has had similar before.  Will obtain imaging of lumbar spine to further assess.  At this time start Prednisone taper and recommend continued use of Aleeve only as needed.  Recommend Icy/Hot cream and alternating between heat and ice.  Offered PT referral, she will speak to her niece who is a PT and obtain stretches to perform at home.  Return in 3 weeks for follow-up.

## 2021-11-04 ENCOUNTER — Ambulatory Visit
Admission: RE | Admit: 2021-11-04 | Discharge: 2021-11-04 | Disposition: A | Discharge status: Home / Self Care | Payer: Medicare Other | Attending: Nurse Practitioner | Admitting: Nurse Practitioner

## 2021-11-04 ENCOUNTER — Other Ambulatory Visit: Payer: Self-pay

## 2021-11-04 ENCOUNTER — Ambulatory Visit
Admission: RE | Admit: 2021-11-04 | Discharge: 2021-11-04 | Disposition: A | Discharge status: Home / Self Care | Payer: Medicare Other | Source: Ambulatory Visit | Attending: Nurse Practitioner | Admitting: Nurse Practitioner

## 2021-11-04 DIAGNOSIS — M5432 Sciatica, left side: Secondary | ICD-10-CM

## 2021-11-05 ENCOUNTER — Encounter: Payer: Self-pay | Admitting: Nurse Practitioner

## 2021-11-05 DIAGNOSIS — I700 Atherosclerosis of aorta: Secondary | ICD-10-CM | POA: Insufficient documentation

## 2021-11-05 NOTE — Progress Notes (Signed)
Contacted via MyChart   Good morning Leslie Turner your imaging has returned.  There is some mild scoliosis noted to lower spine.  Facet hypertrophy is a common enlargement in the facet joints, which are shifting joints in the spine where two spinal bones come in contact.  This finding is common with arthritis.  Retrolisthesis is where one spinal bone shifts backward or under another one -- these could all be part of your pain, as most likely a nerve is getting pinched.  Any questions about this?  If ongoing pain or frequent flares I will recommend further physical therapy or obtaining MRI to further assess area. Keep being amazing!!  Thank you for allowing me to participate in your care.  I appreciate you. Kindest regards, Issacc Merlo

## 2021-12-07 ENCOUNTER — Encounter: Payer: Self-pay | Admitting: Nurse Practitioner

## 2021-12-07 ENCOUNTER — Other Ambulatory Visit: Payer: Self-pay

## 2021-12-07 ENCOUNTER — Ambulatory Visit (INDEPENDENT_AMBULATORY_CARE_PROVIDER_SITE_OTHER): Payer: Medicare Other | Admitting: Nurse Practitioner

## 2021-12-07 VITALS — BP 106/68 | HR 63 | Temp 98.0°F | Ht 64.5 in | Wt 190.2 lb

## 2021-12-07 DIAGNOSIS — M5432 Sciatica, left side: Secondary | ICD-10-CM

## 2021-12-07 DIAGNOSIS — Z1231 Encounter for screening mammogram for malignant neoplasm of breast: Secondary | ICD-10-CM | POA: Diagnosis not present

## 2021-12-07 DIAGNOSIS — Z780 Asymptomatic menopausal state: Secondary | ICD-10-CM

## 2021-12-07 NOTE — Progress Notes (Signed)
BP 106/68    Pulse 63    Temp 98 F (36.7 C) (Oral)    Ht 5' 4.5" (1.638 m)    Wt 190 lb 3.2 oz (86.3 kg)    LMP  (LMP Unknown)    SpO2 97%    BMI 32.14 kg/m    Subjective:    Patient ID: Leslie Turner, female    DOB: 1956/03/04, 66 y.o.   MRN: [id number]  HPI: Leslie Turner is a 66 y.o. female  Chief Complaint  Patient presents with   Knee Pain   Sciatica    Patient states she was having issues with her sciatica and states it has gotten better since her last visit. Patient states she is doing better with the back pain.    Medication Management    Patient states the Celebrex has helped a lot.    BACK PAIN Follow-up for back pain, given Prednisone taper (stopped after 1st day due to reaction) and Celebrex at visit on 11/02/21.  Has had similar before and has been doing stretches, without benefit.  At this time pain has improved, with no further issues.   Duration: weeks Mechanism of injury: no trauma Location: L>R Onset: sudden Alleviating factors: Celebrex and stretching Status: fluctuating Treatments attempted: stretches and Celebrex Relief with NSAIDs?: moderate Nighttime pain: none Paresthesias / decreased sensation:  no Bowel / bladder incontinence:  no Fevers:  no Dysuria / urinary frequency:  no   Relevant past medical, surgical, family and social history reviewed and updated as indicated. Interim medical history since our last visit reviewed. Allergies and medications reviewed and updated.  Review of Systems  Constitutional:  Negative for activity change, appetite change, diaphoresis, fatigue and fever.  Respiratory:  Negative for cough, chest tightness and shortness of breath.   Cardiovascular:  Negative for chest pain, palpitations and leg swelling.  Endocrine: Negative for cold intolerance and heat intolerance.  Musculoskeletal:  Negative for back pain.  Neurological: Negative.   Psychiatric/Behavioral: Negative.     Per HPI unless specifically indicated  above     Objective:    BP 106/68    Pulse 63    Temp 98 F (36.7 C) (Oral)    Ht 5' 4.5" (1.638 m)    Wt 190 lb 3.2 oz (86.3 kg)    LMP  (LMP Unknown)    SpO2 97%    BMI 32.14 kg/m   Wt Readings from Last 3 Encounters:  12/07/21 190 lb 3.2 oz (86.3 kg)  11/02/21 192 lb (87.1 kg)  08/10/21 192 lb 6.4 oz (87.3 kg)    Physical Exam Vitals and nursing note reviewed.  Constitutional:      General: She is awake. She is not in acute distress.    Appearance: She is well-developed, well-groomed and overweight. She is not ill-appearing.  HENT:     Head: Normocephalic.     Right Ear: Hearing normal.     Left Ear: Hearing normal.  Eyes:     General: Lids are normal.        Right eye: No discharge.        Left eye: No discharge.     Conjunctiva/sclera: Conjunctivae normal.     Pupils: Pupils are equal, round, and reactive to light.  Neck:     Thyroid: No thyromegaly.     Vascular: No carotid bruit.  Cardiovascular:     Rate and Rhythm: Normal rate and regular rhythm.     Heart sounds: Normal heart  sounds. No murmur heard.   No gallop.  Pulmonary:     Effort: Pulmonary effort is normal. No accessory muscle usage or respiratory distress.     Breath sounds: Normal breath sounds.  Abdominal:     General: Bowel sounds are normal.     Palpations: Abdomen is soft.  Musculoskeletal:     Cervical back: Normal range of motion and neck supple.     Lumbar back: No swelling, spasms, tenderness or bony tenderness. Normal range of motion. Negative right straight leg raise test and negative left straight leg raise test.     Right lower leg: No edema.     Left lower leg: No edema.  Skin:    General: Skin is warm and dry.  Neurological:     Mental Status: She is alert and oriented to person, place, and time.  Psychiatric:        Attention and Perception: Attention normal.        Mood and Affect: Mood normal.        Behavior: Behavior normal. Behavior is cooperative.        Thought Content:  Thought content normal.        Judgment: Judgment normal.   Results for orders placed or performed in visit on 09/15/21  Potassium  Result Value Ref Range   Potassium 3.7 3.5 - 5.2 mmol/L      Assessment & Plan:   Problem List Items Addressed This Visit       Nervous and Auditory   Left sciatic nerve pain - Primary    Improved at this time with Celebrex as needed.  Did not tolerate Prednisone taper.  Continue PT exercises at home.      Other Visit Diagnoses     Postmenopausal estrogen deficiency       DEXA scan ordered   Relevant Orders   DG Bone Density   Encounter for screening mammogram for malignant neoplasm of breast       Mammogram ordered   Relevant Orders   MM 3D SCREEN BREAST BILATERAL        Follow up plan: Return for as scheduled on 02/08/22.

## 2021-12-07 NOTE — Assessment & Plan Note (Signed)
Improved at this time with Celebrex as needed.  Did not tolerate Prednisone taper.  Continue PT exercises at home.

## 2021-12-07 NOTE — Patient Instructions (Signed)
Please call to schedule your mammogram and/or bone density: [facility]  Address: [address], [place], [place] [address]  Phone: [phone]   Bone Density Test A bone density test uses a type of X-ray to measure the amount of calcium and other minerals in a person's bones. It can measure bone density in the hip and the spine. The test is similar to having a regular X-ray. This test may also be called: Bone densitometry. Bone mineral density test. Dual-energy X-ray absorptiometry (DEXA). You may have this test to: Diagnose a condition that causes weak or thin bones (osteoporosis). Screen you for osteoporosis. Predict your risk for a broken bone (fracture). Determine how well your osteoporosis treatment is working. Tell a health care provider about: Any allergies you have. All medicines you are taking, including vitamins, herbs, eye drops, creams, and over-the-counter medicines. Any problems you or family members have had with anesthetic medicines. Any blood disorders you have. Any surgeries you have had. Any medical conditions you have. Whether you are pregnant or may be pregnant. Any medical tests you have had within the past 14 days that used contrast material. What are the risks? Generally, this is a safe test. However, it does expose you to a small amount of radiation, which can slightly increase your cancer risk. What happens before the test? Do not take any calcium supplements within the 24 hours before your test. You will need to remove all metal jewelry, eyeglasses, removable dental appliances, and any other metal objects on your body. What happens during the test?  You will lie down on an exam table. There will be an X-ray generator below you and an imaging device above you. Other devices, such as boxes or braces, may be used to position your body properly for the scan. The machine will slowly scan your body. You will need to keep  very still while the machine does the scan. The images will show up on a screen in the room. Images will be examined by a specialist after your test is finished. The procedure may vary among health care providers and hospitals. What can I expect after the test? It is up to you to get the results of your test. Ask your health care provider, or the department that is doing the test, when your results will be ready. Summary A bone density test is an imaging test that uses a type of X-ray to measure the amount of calcium and other minerals in your bones. The test may be used to diagnose or screen you for a condition that causes weak or thin bones (osteoporosis), predict your risk for a broken bone (fracture), or determine how well your osteoporosis treatment is working. Do not take any calcium supplements within 24 hours before your test. Ask your health care provider, or the department that is doing the test, when your results will be ready. This information is not intended to replace advice given to you by your health care provider. Make sure you discuss any questions you have with your health care provider. Document Revised: 06/29/2021 Document Reviewed: 04/01/2020 Elsevier Patient Education  [place].

## 2022-01-23 ENCOUNTER — Encounter: Payer: Self-pay | Admitting: Nurse Practitioner

## 2022-01-23 ENCOUNTER — Ambulatory Visit
Admission: RE | Admit: 2022-01-23 | Discharge: 2022-01-23 | Disposition: A | Discharge status: Home / Self Care | Payer: Medicare Other | Source: Ambulatory Visit | Attending: Nurse Practitioner | Admitting: Nurse Practitioner

## 2022-01-23 ENCOUNTER — Other Ambulatory Visit: Payer: Self-pay

## 2022-01-23 DIAGNOSIS — Z1231 Encounter for screening mammogram for malignant neoplasm of breast: Secondary | ICD-10-CM | POA: Insufficient documentation

## 2022-01-23 DIAGNOSIS — M85851 Other specified disorders of bone density and structure, right thigh: Secondary | ICD-10-CM | POA: Insufficient documentation

## 2022-01-23 DIAGNOSIS — Z780 Asymptomatic menopausal state: Secondary | ICD-10-CM

## 2022-01-23 NOTE — Progress Notes (Signed)
Contacted via MyChart   Your bone density shows thinning bones (osteopenia) but not brittle (osteoporosis). We recommend Vitamin D supplementation of about 2,0000 IUs of over the counter Vitamin D3. In addition, we recommend a diet high in calcium with dairy and dark green leafy vegetables. We would like you to get plenty of weight bearing exercises with walking and resistance training such as light weights or resistance bands available with instructions at places such as [employer].

## 2022-01-24 ENCOUNTER — Other Ambulatory Visit: Payer: Self-pay | Admitting: *Deleted

## 2022-01-24 ENCOUNTER — Inpatient Hospital Stay
Admission: RE | Admit: 2022-01-24 | Discharge: 2022-01-24 | Disposition: A | Discharge status: Home / Self Care | Payer: Self-pay | Source: Ambulatory Visit | Attending: *Deleted | Admitting: *Deleted

## 2022-01-24 DIAGNOSIS — Z1231 Encounter for screening mammogram for malignant neoplasm of breast: Secondary | ICD-10-CM

## 2022-01-24 NOTE — Progress Notes (Signed)
Contacted via MyChart   Normal mammogram, may repeat in one year:)

## 2022-02-04 NOTE — Patient Instructions (Addendum)
[facility] ? ?Hypothyroidism ?Hypothyroidism is when the thyroid gland does not make enough of certain hormones (it is underactive). The thyroid gland is a small gland located in the lower front part of the neck, just in front of the windpipe (trachea). This gland makes hormones that help control how the body uses food for energy (metabolism) as well as how the heart and brain function. These hormones also play a role in keeping your bones strong. When the thyroid is underactive, it produces too little of the hormones thyroxine (T4) and triiodothyronine (T3). ?What are the causes? ?This condition may be caused by: ?Hashimoto's disease. This is a disease in which the body's disease-fighting system (immune system) attacks the thyroid gland. This is the most common cause. ?Viral infections. ?Pregnancy. ?Certain medicines. ?Birth defects. ?Past radiation treatments to the head or neck for cancer. ?Past treatment with radioactive iodine. ?Past exposure to radiation in the environment. ?Past surgical removal of part or all of the thyroid. ?Problems with a gland in the center of the brain (pituitary gland). ?Lack of enough iodine in the diet. ?What increases the risk? ?You are more likely to develop this condition if: ?You are female. ?You have a family history of thyroid conditions. ?You use a medicine called lithium. ?You take medicines that affect the immune system (immunosuppressants). ?What are the signs or symptoms? ?Symptoms of this condition include: ?Feeling as though you have no energy (lethargy). ?Not being able to tolerate cold. ?Weight gain that is not explained by a change in diet or exercise habits. ?Lack of appetite. ?Dry skin. ?Coarse hair. ?Menstrual irregularity. ?Slowing of thought processes. ?Constipation. ?Sadness or depression. ?How is this diagnosed? ?This condition may be diagnosed based on: ?Your symptoms, your medical history, and a physical exam. ?Blood tests. ?You may also have imaging  tests, such as an ultrasound or MRI. ?How is this treated? ?This condition is treated with medicine that replaces the thyroid hormones that your body does not make. After you begin treatment, it may take several weeks for symptoms to go away. ?Follow these instructions at home: ?Take over-the-counter and prescription medicines only as told by your health care provider. ?If you start taking any new medicines, tell your health care provider. ?Keep all follow-up visits as told by your health care provider. This is important. ?As your condition improves, your dosage of thyroid hormone medicine may change. ?You will need to have blood tests regularly so that your health care provider can monitor your condition. ?Contact a health care provider if: ?Your symptoms do not get better with treatment. ?You are taking thyroid hormone replacement medicine and you: ?Sweat a lot. ?Have tremors. ?Feel anxious. ?Lose weight rapidly. ?Cannot tolerate heat. ?Have emotional swings. ?Have diarrhea. ?Feel weak. ?Get help right away if you have: ?Chest pain. ?An irregular heartbeat. ?A rapid heartbeat. ?Difficulty breathing. ?Summary ?Hypothyroidism is when the thyroid gland does not make enough of certain hormones (it is underactive). ?When the thyroid is underactive, it produces too little of the hormones thyroxine (T4) and triiodothyronine (T3). ?The most common cause is Hashimoto's disease, a disease in which the body's disease-fighting system (immune system) attacks the thyroid gland. The condition can also be caused by viral infections, medicine, pregnancy, or past radiation treatment to the head or neck. ?Symptoms may include weight gain, dry skin, constipation, feeling as though you do not have energy, and not being able to tolerate cold. ?This condition is treated with medicine to replace the thyroid hormones that your body does  not make. ?This information is not intended to replace advice given to you by your health care  provider. Make sure you discuss any questions you have with your health care provider. ?Document Revised: 06/23/2021 Document Reviewed: 07/01/2020 ?Elsevier Patient Education ? [place]. ? ?

## 2022-02-08 ENCOUNTER — Ambulatory Visit (INDEPENDENT_AMBULATORY_CARE_PROVIDER_SITE_OTHER): Payer: Medicare Other | Admitting: Nurse Practitioner

## 2022-02-08 ENCOUNTER — Encounter: Payer: Self-pay | Admitting: Nurse Practitioner

## 2022-02-08 VITALS — BP 119/73 | HR 60 | Temp 97.6°F | Ht 64.5 in | Wt 194.2 lb

## 2022-02-08 DIAGNOSIS — E559 Vitamin D deficiency, unspecified: Secondary | ICD-10-CM

## 2022-02-08 DIAGNOSIS — Z0000 Encounter for general adult medical examination without abnormal findings: Secondary | ICD-10-CM

## 2022-02-08 DIAGNOSIS — F411 Generalized anxiety disorder: Secondary | ICD-10-CM

## 2022-02-08 DIAGNOSIS — I700 Atherosclerosis of aorta: Secondary | ICD-10-CM | POA: Diagnosis not present

## 2022-02-08 DIAGNOSIS — F32A Depression, unspecified: Secondary | ICD-10-CM | POA: Diagnosis not present

## 2022-02-08 DIAGNOSIS — E039 Hypothyroidism, unspecified: Secondary | ICD-10-CM

## 2022-02-08 DIAGNOSIS — R809 Proteinuria, unspecified: Secondary | ICD-10-CM | POA: Insufficient documentation

## 2022-02-08 DIAGNOSIS — I10 Essential (primary) hypertension: Secondary | ICD-10-CM | POA: Diagnosis not present

## 2022-02-08 DIAGNOSIS — E7800 Pure hypercholesterolemia, unspecified: Secondary | ICD-10-CM | POA: Diagnosis not present

## 2022-02-08 DIAGNOSIS — M170 Bilateral primary osteoarthritis of knee: Secondary | ICD-10-CM

## 2022-02-08 DIAGNOSIS — Z23 Encounter for immunization: Secondary | ICD-10-CM

## 2022-02-08 DIAGNOSIS — M85851 Other specified disorders of bone density and structure, right thigh: Secondary | ICD-10-CM

## 2022-02-08 LAB — MICROALBUMIN, URINE WAIVED
Creatinine, Urine Waived: 300 mg/dL (ref 10–300)
Microalb, Ur Waived: 80 mg/L — ABNORMAL HIGH (ref 0–19)

## 2022-02-08 MED ORDER — HYDROCHLOROTHIAZIDE 25 MG PO TABS: 12.5000 mg | ORAL_TABLET | Freq: Every day | ORAL | 4 refills | Status: DC

## 2022-02-08 MED ORDER — LEVOTHYROXINE SODIUM 112 MCG PO TABS: 112.0000 ug | ORAL_TABLET | Freq: Every day | ORAL | 4 refills | Status: DC

## 2022-02-08 MED ORDER — ATORVASTATIN CALCIUM 40 MG PO TABS: 40.0000 mg | ORAL_TABLET | Freq: Every day | ORAL | 4 refills | Status: DC

## 2022-02-08 MED ORDER — MELOXICAM 15 MG PO TABS: 15.0000 mg | ORAL_TABLET | Freq: Every day | ORAL | 4 refills | Status: DC

## 2022-02-08 MED ORDER — LOSARTAN POTASSIUM 50 MG PO TABS: 50.0000 mg | ORAL_TABLET | Freq: Every day | ORAL | 4 refills | Status: DC

## 2022-02-08 MED ORDER — SERTRALINE HCL 100 MG PO TABS: 50.0000 mg | ORAL_TABLET | Freq: Every day | ORAL | 4 refills | Status: DC

## 2022-02-08 NOTE — Assessment & Plan Note (Signed)
Chronic, stable with BP below goal.  Continue current medication regimen and adjust as needed.  Obtain CMP, urine ALB, and TSH + CBC today.  Recommend checking BP at home three days a week + focus on DASH diet, could consider reduction of medication in future.  Urine ALB 80, continue Losartan for kidney protection.  Return in 6 months. ?

## 2022-02-08 NOTE — Assessment & Plan Note (Signed)
Noted on lumbar imaging 11/04/21 -- continue statin daily and recommend ASA daily for prevention. ?

## 2022-02-08 NOTE — Assessment & Plan Note (Signed)
Chronic, stable with reduction in dose.  Denies SI/HI.  Continue current medication regimen and adjust as needed, continue to reduce as tolerated.  Check LFTs today.   ?

## 2022-02-08 NOTE — Assessment & Plan Note (Signed)
Chronic, ongoing.  Continue daily supplement and check Vit D level today. ?

## 2022-02-08 NOTE — Progress Notes (Signed)
? ?BP 119/73   Pulse 60   Temp 97.6 ?F (36.4 ?C) (Oral)   Ht 5' 4.5" (1.638 m)   Wt 194 lb 3.2 oz (88.1 kg)   LMP  (LMP Unknown)   SpO2 99%   BMI 32.82 kg/m?   ? ?Subjective:  ? ? Patient ID: Leslie Turner, female    DOB: 12-16-1955, 66 y.o.   MRN: [id number] ? ?HPI: ?Leslie Turner is a 66 y.o. female presenting on 02/08/2022 for comprehensive medical examination. Current medical complaints include: left knee pain ? ?She currently lives with: husband ?Menopausal Symptoms: no ? ?HYPERTENSION / HYPERLIPIDEMIA ?Satisfied with current treatment? yes ?Duration of hypertension: chronic ?BP monitoring frequency: not checking ?BP range:  ?BP medication side effects: no ?Past BP meds: HCTZ and losartan (cozaar) ?Duration of hyperlipidemia: chronic ?Cholesterol medication side effects: no ?Cholesterol supplements: none ?Past cholesterol medications: atorvastain (lipitor) ?Medication compliance: good compliance ?Aspirin: no ?Recent stressors: no ?Recurrent headaches: no ?Visual changes: no ?Palpitations: no ?Dyspnea: no ?Chest pain: no ?Lower extremity edema: no ?Dizzy/lightheaded: no  ? ?OSTEOPENIA ?Initial DEXA 01/23/22, The BMD measured at Femur Total Right is 0.813 g/cm2 with a T-score of -1.5.  ?Satisfied with current treatment?: yes ?Adequate calcium & vitamin D: yes ?Weight bearing exercises: yes  ? ?KNEE PAIN (LEFT) ?Left knee acting up today, last injection 06/22/21.  Has known OA right side noted on past imaging.  Reports today she feels the left one hurts.  At this time L>R.  Has done physical therapy in past without much benefit. ?  ?Knees are worse after prolonged sitting and getting up in morning + in evenings.   ?Duration: weeks ?Involved knee: bilateral ?Mechanism of injury: unknown ?Location:diffuse ?Onset: gradual ?Severity: 8/10 today ?Quality: dull and aching with occasional sharp ?Frequency: intermittent ?Radiation: no ?Aggravating factors: weight bearing, walking, stairs, bending and movement   ?Alleviating factors: Aleeve ?Status: fluctuating ?Treatments attempted: rest and aleve, Voltaren and stretching, Tylenol, Celebrex ?Relief with NSAIDs?:  mild ?Weakness with weight bearing or walking: yes ?Sensation of giving way: yes ?Locking: yes ?Popping: at times ?Bruising: no ?Swelling: no ?Redness: no ?Paresthesias/decreased sensation: no ?Fevers: no ?  ?HYPOTHYROIDISM ?Taking medication as prescribed, no issues today. ?Thyroid control status:controlled ?Satisfied with current treatment? yes ?Medication side effects: no ?Medication compliance: good compliance ?Etiology of hypothyroidism:  ?Recent dose adjustment:no ?Fatigue: no ?Cold intolerance: no ?Heat intolerance: no ?Weight gain: no ?Weight loss: no ?Constipation: no ?Diarrhea/loose stools: no ?Palpitations: no ?Lower extremity edema: no ?Anxiety/depressed mood: no  ?  ?ANXIETY/DEPRESSION ?Patient states she is doing good, denies SI/HI, taking medication which is helping. Taking 50 MG of Zoloft at this time, has self reduced. ?Controlled levels ?Anxious mood: no  ?Excessive worrying: no ?Irritability: no  ?Sweating: no ?Nausea: no ?Palpitations:no ?Hyperventilation: no ?Panic attacks: no ?Agoraphobia: no  ?Obscessions/compulsions: no ?Depressed mood: no ? ?  02/08/2022  ?  8:59 AM 08/10/2021  ? 10:35 AM 02/08/2021  ?  9:37 AM 12/14/2020  ?  8:46 AM 08/10/2020  ?  1:52 PM  ?Depression screen PHQ 2/9  ?Decreased Interest 0 0 0 0 0  ?Down, Depressed, Hopeless 0 0 0 0 0  ?PHQ - 2 Score 0 0 0 0 0  ?Altered sleeping 0 0 0 0 3  ?Tired, decreased energy 1 1 0 0 0  ?Change in appetite 0 0 0 0 0  ?Feeling bad or failure about yourself  0 0 0 0 0  ?Trouble concentrating 0 0 0 0 0  ?Moving  slowly or fidgety/restless 0 0 0 0 0  ?Suicidal thoughts 0 0 0 0 0  ?PHQ-9 Score 1 1 0 0 3  ?Difficult doing work/chores  Not difficult at all Not difficult at all  Not difficult at all  ? ? ?  02/08/2022  ?  9:00 AM 08/10/2021  ? 10:36 AM 02/08/2021  ?  9:38 AM 12/14/2020  ?  8:47 AM   ?GAD 7 : Generalized Anxiety Score  ?Nervous, Anxious, on Edge 0 0 0 0  ?Control/stop worrying 0 0 0 0  ?Worry too much - different things 0 0 0 0  ?Trouble relaxing 0 0 0 0  ?Restless 0 0 0 0  ?Easily annoyed or irritable 0 0 0 0  ?Afraid - awful might happen 0 0 0 0  ?Total GAD 7 Score 0 0 0 0  ?Anxiety Difficulty Not difficult at all Not difficult at all Not difficult at all   ? ?  ?Past Medical History:  ?Past Medical History:  ?Diagnosis Date  ? Anxiety   ? Carotid stenosis   ? Depression   ? GERD (gastroesophageal reflux disease)   ? Hyperlipidemia   ? Hypertension   ? Metabolic syndrome   ? Microscopic hematuria   ? Pain in knee   ? Thyroid disease   ? hypothyroid  ? Vitamin D deficiency   ? ? ?Surgical History:  ?Past Surgical History:  ?Procedure Laterality Date  ? APPENDECTOMY    ? ENDOMETRIAL BIOPSY  06/15/2009  ? disordered prolif. phase  ? FOOT NEUROMA SURGERY Left   ? ? ?Medications:  ?Current Outpatient Medications on File Prior to Visit  ?Medication Sig  ? cholecalciferol (VITAMIN D) 1000 UNITS tablet Take 1,000 Units by mouth daily.  ? hydrOXYzine (ATARAX/VISTARIL) 25 MG tablet TAKE 1 TABLET (25 MG TOTAL) BY MOUTH 2 (TWO) TIMES DAILY AS NEEDED.  ? Multiple Vitamins-Minerals (CENTRUM SILVER 50+WOMEN PO) Take 1 tablet by mouth daily.  ? ?No current facility-administered medications on file prior to visit.  ? ? ?Allergies:  ?No Known Allergies ? ?Social History:  ?Social History  ? ?Socioeconomic History  ? Marital status: Married  ?  Spouse name: Elta Guadeloupe  ? Number of children: 2  ? Years of education: Not on file  ? Highest education level: Not on file  ?Occupational History  ? Not on file  ?Tobacco Use  ? Smoking status: Former  ? Smokeless tobacco: Never  ? Tobacco comments:  ?  smoked for 12-14 years and quit cold [place]  ?Vaping Use  ? Vaping Use: Never used  ?Substance and Sexual Activity  ? Alcohol use: No  ?  Alcohol/week: 0.0 standard drinks  ?  Comment: occ  ? Drug use: No  ? Sexual activity:  Not Currently  ?  Partners: Male  ?Other Topics Concern  ? Not on file  ?Social History Narrative  ? Not on file  ? ?Social Determinants of Health  ? ?Financial Resource Strain: Not on file  ?Food Insecurity: Not on file  ?Transportation Needs: Not on file  ?Physical Activity: Not on file  ?Stress: Not on file  ?Social Connections: Not on file  ?Intimate Partner Violence: Not on file  ? ?Social History  ? ?Tobacco Use  ?Smoking Status Former  ?Smokeless Tobacco Never  ?Tobacco Comments  ? smoked for 12-14 years and quit cold [place]  ? ?Social History  ? ?Substance and Sexual Activity  ?Alcohol Use No  ? Alcohol/week: 0.0 standard drinks  ?  Comment: occ  ? ? ?Family History:  ?Family History  ?Problem Relation Age of Onset  ? Arthritis Mother   ? Cancer Mother 37  ?     colon  ? Diabetes Father   ? Heart disease Father   ? Hypertension Father   ? Breast cancer Paternal Aunt   ? Heart disease Paternal Grandfather   ?     MI  ? Heart disease Brother   ? Lung cancer Brother   ? ? ?Past medical history, surgical history, medications, allergies, family history and social history reviewed with patient today and changes made to appropriate areas of the chart.  ? ?ROS ?All other ROS negative except what is listed above and in the HPI.  ? ?   ?Objective:  ?  ?BP 119/73   Pulse 60   Temp 97.6 ?F (36.4 ?C) (Oral)   Ht 5' 4.5" (1.638 m)   Wt 194 lb 3.2 oz (88.1 kg)   LMP  (LMP Unknown)   SpO2 99%   BMI 32.82 kg/m?   ?Wt Readings from Last 3 Encounters:  ?02/08/22 194 lb 3.2 oz (88.1 kg)  ?12/07/21 190 lb 3.2 oz (86.3 kg)  ?11/02/21 192 lb (87.1 kg)  ?  ?Physical Exam ?Vitals and nursing note reviewed. Exam conducted with a chaperone present.  ?Constitutional:   ?   General: She is awake. She is not in acute distress. ?   Appearance: She is well-developed and well-groomed. She is not ill-appearing or toxic-appearing.  ?HENT:  ?   Head: Normocephalic and atraumatic.  ?   Right Ear: Hearing, tympanic membrane, ear canal and  external ear normal. No drainage.  ?   Left Ear: Hearing, tympanic membrane, ear canal and external ear normal. No drainage.  ?   Nose: Nose normal.  ?   Right Sinus: No maxillary sinus tenderness or frontal sinus tende

## 2022-02-08 NOTE — Assessment & Plan Note (Signed)
New diagnosis on 01/23/22.  Recommend daily Vitamin D 2000 units, which she has started, and adequate calcium intake.  Weight bearing exercises recommended.  Vit D level today. ?

## 2022-02-08 NOTE — Assessment & Plan Note (Signed)
Chronic, with improvement, she has been reducing Sertraline dose on own as tolerated.  Continue Sertraline 50 MG and Vistaril as needed (using very minimally).  Denies SI/HI.  Return to office in 6 months. ?

## 2022-02-08 NOTE — Assessment & Plan Note (Addendum)
Chronic, stable.  Continue current medication regimen and adjust as needed.  TSH and Free T4 today + antibody. ?

## 2022-02-08 NOTE — Assessment & Plan Note (Signed)
Chronic, ongoing.  Has received steroid injections to knees in past and is very knowledgeable of procedure and risks/benefits, further discussed today.  Verbal consent given and injection performed to left knee.  Recommend she ice area every couple hours for 24 hours.  Continue simple treatment at home, OTC creams.  Will send in script for Celecoxib to use as needed only daily.  Recommend focus on exercise regimen and modest weight loss.  Return as scheduled.  Recommend ortho referral, but she wishes to wait. ?

## 2022-02-08 NOTE — Assessment & Plan Note (Signed)
Chronic, stable.  Continue current medication regimen and adjust as needed.  Obtain CMP and lipid panel today. 

## 2022-02-09 ENCOUNTER — Encounter: Payer: Self-pay | Admitting: Nurse Practitioner

## 2022-02-09 LAB — CBC WITH DIFFERENTIAL/PLATELET
Basophils Absolute: 0.1 10*3/uL (ref 0.0–0.2)
Basos: 1 %
EOS (ABSOLUTE): 0.6 10*3/uL — ABNORMAL HIGH (ref 0.0–0.4)
Eos: 8 %
Hematocrit: 42.3 % (ref 34.0–46.6)
Hemoglobin: 13.7 g/dL (ref 11.1–15.9)
Immature Grans (Abs): 0 10*3/uL (ref 0.0–0.1)
Immature Granulocytes: 0 %
Lymphocytes Absolute: 2.5 10*3/uL (ref 0.7–3.1)
Lymphs: 39 %
MCH: 29.8 pg (ref 26.6–33.0)
MCHC: 32.4 g/dL (ref 31.5–35.7)
MCV: 92 fL (ref 79–97)
Monocytes Absolute: 0.4 10*3/uL (ref 0.1–0.9)
Monocytes: 7 %
Neutrophils Absolute: 2.9 10*3/uL (ref 1.4–7.0)
Neutrophils: 45 %
Platelets: 226 10*3/uL (ref 150–450)
RBC: 4.59 x10E6/uL (ref 3.77–5.28)
RDW: 12.8 % (ref 11.7–15.4)
WBC: 6.5 10*3/uL (ref 3.4–10.8)

## 2022-02-09 LAB — LIPID PANEL W/O CHOL/HDL RATIO
Cholesterol, Total: 167 mg/dL (ref 100–199)
HDL: 55 mg/dL (ref >39)
LDL Chol Calc (NIH): 87 mg/dL (ref 0–99)
Triglycerides: 141 mg/dL (ref 0–149)
VLDL Cholesterol Cal: 25 mg/dL (ref 5–40)

## 2022-02-09 LAB — COMPREHENSIVE METABOLIC PANEL
ALT: 16 IU/L (ref 0–32)
AST: 23 IU/L (ref 0–40)
Albumin/Globulin Ratio: 1.9 (ref 1.2–2.2)
Albumin: 4.2 g/dL (ref 3.8–4.8)
Alkaline Phosphatase: 89 IU/L (ref 44–121)
BUN/Creatinine Ratio: 23 (ref 12–28)
BUN: 20 mg/dL (ref 8–27)
Bilirubin Total: 0.3 mg/dL (ref 0.0–1.2)
CO2: 25 mmol/L (ref 20–29)
Calcium: 9.4 mg/dL (ref 8.7–10.3)
Chloride: 104 mmol/L (ref 96–106)
Creatinine, Ser: 0.87 mg/dL (ref 0.57–1.00)
Globulin, Total: 2.2 g/dL (ref 1.5–4.5)
Glucose: 101 mg/dL — ABNORMAL HIGH (ref 70–99)
Potassium: 3.7 mmol/L (ref 3.5–5.2)
Sodium: 144 mmol/L (ref 134–144)
Total Protein: 6.4 g/dL (ref 6.0–8.5)
eGFR: 74 mL/min/{1.73_m2} (ref >59)

## 2022-02-09 LAB — TSH: TSH: 1.75 u[IU]/mL (ref 0.450–4.500)

## 2022-02-09 LAB — VITAMIN D 25 HYDROXY (VIT D DEFICIENCY, FRACTURES): Vit D, 25-Hydroxy: 31.7 ng/mL (ref 30.0–100.0)

## 2022-02-09 LAB — THYROID PEROXIDASE ANTIBODY: Thyroperoxidase Ab SerPl-aCnc: 42 IU/mL — ABNORMAL HIGH (ref 0–34)

## 2022-02-09 LAB — T4, FREE: Free T4: 1.48 ng/dL (ref 0.82–1.77)

## 2022-02-09 NOTE — Progress Notes (Signed)
Contacted via [place] ? ? ?Good evening Leslie Turner, your labs have returned.  First off, thanks for the camera!!  [name] is in love and wants to try to get it functioning.  Overall labs are stable, no concerns on these.  Your thyroid antibody is mildly elevated, which often means the thyroid disease is caused by something called Hashimoto's.  For this we continue to treat as we do now with Levothyroxine.  No changes in this needed.  Any questions? ?Keep being amazing!!  Thank you for allowing me to participate in your care.  I appreciate you. ?Kindest regards, ?Maleeya Peterkin ?

## 2022-02-17 NOTE — Addendum Note (Signed)
Addended by: Marnee Guarneri T on: 02/17/2022 09:06 AM ? ? Modules accepted: Level of Service ? ?

## 2022-02-18 ENCOUNTER — Other Ambulatory Visit: Payer: Self-pay | Admitting: Nurse Practitioner

## 2022-02-20 NOTE — Telephone Encounter (Signed)
Discontinued 08/10/21, not on current med profile. ? ?Requested Prescriptions  ?Pending Prescriptions Disp Refills  ?? celecoxib (CELEBREX) 50 MG capsule [Pharmacy Med Name: CELECOXIB 50 MG CAPSULE] 90 capsule 1  ?  Sig: TAKE 1 CAPSULE (50 MG TOTAL) BY MOUTH DAILY AS NEEDED FOR PAIN.  ?  ? Analgesics:  COX2 Inhibitors Failed - 02/18/2022 10:07 AM  ?  ?  Failed - Manual Review: Labs are only required if the patient has taken medication for more than 8 weeks.  ?  ?  Passed - HGB in normal range and within 360 days  ?  Hemoglobin  ?Date Value Ref Range Status  ?02/08/2022 13.7 11.1 - 15.9 g/dL Final  ?   ?  ?  Passed - Cr in normal range and within 360 days  ?  Creatinine, Ser  ?Date Value Ref Range Status  ?02/08/2022 0.87 0.57 - 1.00 mg/dL Final  ?   ?  ?  Passed - HCT in normal range and within 360 days  ?  Hematocrit  ?Date Value Ref Range Status  ?02/08/2022 42.3 34.0 - 46.6 % Final  ?   ?  ?  Passed - AST in normal range and within 360 days  ?  AST  ?Date Value Ref Range Status  ?02/08/2022 23 0 - 40 IU/L Final  ?   ?  ?  Passed - ALT in normal range and within 360 days  ?  ALT  ?Date Value Ref Range Status  ?02/08/2022 16 0 - 32 IU/L Final  ?   ?  ?  Passed - eGFR is 30 or above and within 360 days  ?  GFR calc Af Amer  ?Date Value Ref Range Status  ?08/10/2020 88 >59 mL/min/1.73 Final  ?  Comment:  ?  **[facility] currently reports eGFR in compliance with the current** ?  recommendations of the [employer]. [facility] will ?  update reporting as new guidelines are published from the NKF-ASN ?  Task force. ?  ? ?GFR calc non Af Amer  ?Date Value Ref Range Status  ?08/10/2020 76 >59 mL/min/1.73 Final  ? ?eGFR  ?Date Value Ref Range Status  ?02/08/2022 74 >59 mL/min/1.73 Final  ?   ?  ?  Passed - Patient is not pregnant  ?  ?  Passed - Valid encounter within last 12 months  ?  Recent Outpatient Visits   ?      ? 1 week ago Aortic atherosclerosis ([place])  ? [facility] [place], [place] T, NP  ? 2  months ago Left sciatic nerve pain  ? [place], [place] T, NP  ? 3 months ago Left sciatic nerve pain  ? [place], [place] T, NP  ? 6 months ago Flu vaccine need  ? [place], [place] T, NP  ? 8 months ago Primary osteoarthritis of both knees  ? [facility] [place], Henrine Screws T, NP  ?  ?  ?Future Appointments   ?        ? In 5 months [place], Barbaraann Faster, NP [employer]  ?  ? ?  ?  ?  ? ? ?

## 2022-03-29 ENCOUNTER — Encounter: Payer: Self-pay | Admitting: Nurse Practitioner

## 2022-03-29 MED ORDER — MELOXICAM 15 MG PO TABS: 15.0000 mg | ORAL_TABLET | Freq: Every day | ORAL | 4 refills | Status: DC

## 2022-05-26 ENCOUNTER — Encounter: Payer: Self-pay | Admitting: Nurse Practitioner

## 2022-05-29 NOTE — Telephone Encounter (Signed)
Pt scheduled  

## 2022-05-30 ENCOUNTER — Ambulatory Visit (INDEPENDENT_AMBULATORY_CARE_PROVIDER_SITE_OTHER): Payer: Medicare Other | Admitting: Nurse Practitioner

## 2022-05-30 ENCOUNTER — Encounter: Payer: Self-pay | Admitting: Nurse Practitioner

## 2022-05-30 DIAGNOSIS — M170 Bilateral primary osteoarthritis of knee: Secondary | ICD-10-CM | POA: Diagnosis not present

## 2022-05-30 NOTE — Patient Instructions (Signed)

## 2022-05-30 NOTE — Progress Notes (Signed)
BP 104/67   Pulse (!) 58   Temp 98.2 F (36.8 C) (Oral)   Wt 193 lb (87.5 kg)   LMP  (LMP Unknown)   SpO2 96%   BMI 32.62 kg/m    Subjective:    Patient ID: Leslie Turner, female    DOB: 04/18/1956, 66 y.o.   MRN: [id number]  HPI: Leslie Turner is a 66 y.o. female  Chief Complaint  Patient presents with   Cortisone Injection    Patient is here for Cortisone Injection.    KNEE PAIN Present for one week to right knee -- history of left knee injection in past.  Last 06/22/21 which he tolerated well.  Would like a knee injection today. Duration: weeks Involved knee: right Mechanism of injury: unknown Location:diffuse Onset: gradual Severity: 6/10  Quality:  sharp, dull, aching, and throbbing Frequency: intermittent Radiation: no Aggravating factors: weight bearing, walking, and movement  Alleviating factors: ice, APAP, and rest  Status: fluctuating Treatments attempted: rest, ice, and APAP  Relief with NSAIDs?:  No NSAIDs Taken Weakness with weight bearing or walking: yes Sensation of giving way: yes Locking: no Popping: yes Bruising: no Swelling: no Redness: no Paresthesias/decreased sensation: no Fevers: no   Relevant past medical, surgical, family and social history reviewed and updated as indicated. Interim medical history since our last visit reviewed. Allergies and medications reviewed and updated.  Review of Systems  Constitutional:  Negative for activity change, appetite change, diaphoresis, fatigue and fever.  Respiratory:  Negative for cough, chest tightness and shortness of breath.   Cardiovascular:  Negative for chest pain, palpitations and leg swelling.  Gastrointestinal: Negative.   Musculoskeletal:  Positive for arthralgias.  Neurological: Negative.   Psychiatric/Behavioral: Negative.     Per HPI unless specifically indicated above     Objective:    BP 104/67   Pulse (!) 58   Temp 98.2 F (36.8 C) (Oral)   Wt 193 lb (87.5 kg)    LMP  (LMP Unknown)   SpO2 96%   BMI 32.62 kg/m   Wt Readings from Last 3 Encounters:  05/30/22 193 lb (87.5 kg)  02/08/22 194 lb 3.2 oz (88.1 kg)  12/07/21 190 lb 3.2 oz (86.3 kg)    Physical Exam Vitals and nursing note reviewed.  Constitutional:      General: She is awake. She is not in acute distress.    Appearance: She is well-developed, well-groomed and overweight. She is not ill-appearing.  HENT:     Head: Normocephalic.     Right Ear: Hearing normal.     Left Ear: Hearing normal.  Eyes:     General: Lids are normal.        Right eye: No discharge.        Left eye: No discharge.     Conjunctiva/sclera: Conjunctivae normal.     Pupils: Pupils are equal, round, and reactive to light.  Neck:     Thyroid: No thyromegaly.     Vascular: No carotid bruit.  Cardiovascular:     Rate and Rhythm: Normal rate and regular rhythm.     Heart sounds: Normal heart sounds. No murmur heard.    No gallop.  Pulmonary:     Effort: Pulmonary effort is normal. No accessory muscle usage or respiratory distress.     Breath sounds: Normal breath sounds.  Abdominal:     General: Bowel sounds are normal.     Palpations: Abdomen is soft.  Musculoskeletal:  Cervical back: Normal range of motion and neck supple.     Right knee: Crepitus present. No swelling, effusion, erythema or ecchymosis. Decreased range of motion. Tenderness present over the medial joint line and lateral joint line. Normal pulse.     Left knee: Crepitus present. No swelling, effusion, erythema or ecchymosis. Normal pulse.     Right lower leg: No edema.     Left lower leg: No edema.  Skin:    General: Skin is warm and dry.  Neurological:     Mental Status: She is alert and oriented to person, place, and time.  Psychiatric:        Attention and Perception: Attention normal.        Mood and Affect: Mood normal.        Behavior: Behavior normal. Behavior is cooperative.        Thought Content: Thought content normal.         Judgment: Judgment normal.   STEROID INJECTION Procedure: Right Knee Intraarticular Steroid Injection  Description: After verbal consent and patient education on procedure. Area prepped and draped using semi-sterile technique. Using a anterior  approach, a mixture of 4 cc of 1% Marcaine & 1 cc of Kenalog 40 was injected into knee joint.  A bandage was then placed over the injection site.  Complications: none Post Procedure Instructions: To the ER if any symptoms of erythema or swelling.   Follow Up: PRN   Results for orders placed or performed in visit on 02/08/22  Microalbumin, Urine Waived  Result Value Ref Range   Microalb, Ur Waived 80 (H) 0 - 19 mg/L   Creatinine, Urine Waived 300 10 - 300 mg/dL   Microalb/Creat Ratio 30-300 (H) <30 mg/g  CBC with Differential/Platelet  Result Value Ref Range   WBC 6.5 3.4 - 10.8 x10E3/uL   RBC 4.59 3.77 - 5.28 x10E6/uL   Hemoglobin 13.7 11.1 - 15.9 g/dL   Hematocrit 42.3 34.0 - 46.6 %   MCV 92 79 - 97 fL   MCH 29.8 26.6 - 33.0 pg   MCHC 32.4 31.5 - 35.7 g/dL   RDW 12.8 11.7 - 15.4 %   Platelets 226 150 - 450 x10E3/uL   Neutrophils 45 Not Estab. %   Lymphs 39 Not Estab. %   Monocytes 7 Not Estab. %   Eos 8 Not Estab. %   Basos 1 Not Estab. %   Neutrophils Absolute 2.9 1.4 - 7.0 x10E3/uL   Lymphocytes Absolute 2.5 0.7 - 3.1 x10E3/uL   Monocytes Absolute 0.4 0.1 - 0.9 x10E3/uL   EOS (ABSOLUTE) 0.6 (H) 0.0 - 0.4 x10E3/uL   Basophils Absolute 0.1 0.0 - 0.2 x10E3/uL   Immature Granulocytes 0 Not Estab. %   Immature Grans (Abs) 0.0 0.0 - 0.1 x10E3/uL  Comprehensive metabolic panel  Result Value Ref Range   Glucose 101 (H) 70 - 99 mg/dL   BUN 20 8 - 27 mg/dL   Creatinine, Ser 0.87 0.57 - 1.00 mg/dL   eGFR 74 >59 mL/min/1.73   BUN/Creatinine Ratio 23 12 - 28   Sodium 144 134 - 144 mmol/L   Potassium 3.7 3.5 - 5.2 mmol/L   Chloride 104 96 - 106 mmol/L   CO2 25 20 - 29 mmol/L   Calcium 9.4 8.7 - 10.3 mg/dL   Total Protein 6.4 6.0 -  8.5 g/dL   Albumin 4.2 3.8 - 4.8 g/dL   Globulin, Total 2.2 1.5 - 4.5 g/dL   Albumin/Globulin Ratio 1.9 1.2 -  2.2   Bilirubin Total 0.3 0.0 - 1.2 mg/dL   Alkaline Phosphatase 89 44 - 121 IU/L   AST 23 0 - 40 IU/L   ALT 16 0 - 32 IU/L  Lipid Panel w/o Chol/HDL Ratio  Result Value Ref Range   Cholesterol, Total 167 100 - 199 mg/dL   Triglycerides 141 0 - 149 mg/dL   HDL 55 >39 mg/dL   VLDL Cholesterol Cal 25 5 - 40 mg/dL   LDL Chol Calc (NIH) 87 0 - 99 mg/dL  TSH  Result Value Ref Range   TSH 1.750 0.450 - 4.500 uIU/mL  VITAMIN D 25 Hydroxy (Vit-D Deficiency, Fractures)  Result Value Ref Range   Vit D, 25-Hydroxy 31.7 30.0 - 100.0 ng/mL  Thyroid peroxidase antibody  Result Value Ref Range   Thyroperoxidase Ab SerPl-aCnc 42 (H) 0 - 34 IU/mL  T4, free  Result Value Ref Range   Free T4 1.48 0.82 - 1.77 ng/dL      Assessment & Plan:   Problem List Items Addressed This Visit       Musculoskeletal and Integument   Primary osteoarthritis of both knees    Chronic, ongoing.  Has received steroid injections to knees in past and is very knowledgeable of procedure and risks/benefits, further discussed today.  Verbal consent given and injection performed to right knee.  Recommend she ice area every couple hours for 24 hours.  Continue simple treatment at home, OTC creams.  Has script for Celecoxib to use as needed only daily.  Recommend focus on exercise regimen and modest weight loss.  Return as scheduled.  Recommend ortho referral, but she wishes to wait.        Follow up plan: Return if symptoms worsen or fail to improve.

## 2022-05-30 NOTE — Assessment & Plan Note (Signed)
Chronic, ongoing.  Has received steroid injections to knees in past and is very knowledgeable of procedure and risks/benefits, further discussed today.  Verbal consent given and injection performed to right knee.  Recommend she ice area every couple hours for 24 hours.  Continue simple treatment at home, OTC creams.  Has script for Celecoxib to use as needed only daily.  Recommend focus on exercise regimen and modest weight loss.  Return as scheduled.  Recommend ortho referral, but she wishes to wait. 

## 2022-08-06 NOTE — Patient Instructions (Signed)

## 2022-08-10 ENCOUNTER — Encounter: Payer: Self-pay | Admitting: Nurse Practitioner

## 2022-08-10 ENCOUNTER — Ambulatory Visit (INDEPENDENT_AMBULATORY_CARE_PROVIDER_SITE_OTHER): Payer: Medicare Other | Admitting: Nurse Practitioner

## 2022-08-10 VITALS — BP 108/71 | HR 57 | Temp 98.3°F | Ht 64.5 in | Wt 192.4 lb

## 2022-08-10 DIAGNOSIS — E7800 Pure hypercholesterolemia, unspecified: Secondary | ICD-10-CM

## 2022-08-10 DIAGNOSIS — R8281 Pyuria: Secondary | ICD-10-CM

## 2022-08-10 DIAGNOSIS — F411 Generalized anxiety disorder: Secondary | ICD-10-CM | POA: Diagnosis not present

## 2022-08-10 DIAGNOSIS — F32A Depression, unspecified: Secondary | ICD-10-CM | POA: Diagnosis not present

## 2022-08-10 DIAGNOSIS — E063 Autoimmune thyroiditis: Secondary | ICD-10-CM

## 2022-08-10 DIAGNOSIS — I10 Essential (primary) hypertension: Secondary | ICD-10-CM | POA: Diagnosis not present

## 2022-08-10 DIAGNOSIS — R399 Unspecified symptoms and signs involving the genitourinary system: Secondary | ICD-10-CM

## 2022-08-10 DIAGNOSIS — Z1211 Encounter for screening for malignant neoplasm of colon: Secondary | ICD-10-CM

## 2022-08-10 LAB — URINALYSIS, ROUTINE W REFLEX MICROSCOPIC
Bilirubin, UA: NEGATIVE
Glucose, UA: NEGATIVE
Nitrite, UA: NEGATIVE
RBC, UA: NEGATIVE
Specific Gravity, UA: 1.02 (ref 1.005–1.030)
Urobilinogen, Ur: 0.2 mg/dL (ref 0.2–1.0)
pH, UA: 7.5 (ref 5.0–7.5)

## 2022-08-10 LAB — WET PREP FOR TRICH, YEAST, CLUE
Clue Cell Exam: POSITIVE — AB
Trichomonas Exam: NEGATIVE
Yeast Exam: NEGATIVE

## 2022-08-10 LAB — MICROSCOPIC EXAMINATION: Bacteria, UA: NONE SEEN

## 2022-08-10 MED ORDER — METRONIDAZOLE 500 MG PO TABS: 500.0000 mg | ORAL_TABLET | Freq: Two times a day (BID) | ORAL | 0 refills | Status: AC

## 2022-08-10 NOTE — Assessment & Plan Note (Signed)
Chronic, stable.  Denies SI/HI.  Continue current medication regimen and adjust as needed, continue to reduce as tolerated.  Check LFTs today.

## 2022-08-10 NOTE — Assessment & Plan Note (Signed)
Chronic, stable.  Continue current medication regimen and adjust as needed.  Labs up to date and stable.

## 2022-08-10 NOTE — Assessment & Plan Note (Signed)
Chronic, stable.  Continue Sertraline 50 MG and Vistaril as needed (using very minimally).  Denies SI/HI.  Return to office in 6 months.

## 2022-08-10 NOTE — Assessment & Plan Note (Signed)
Chronic, stable.  BP well below goal.  Continue current medication regimen and adjust as needed.  Obtain BMP.  Recommend checking BP at home three days a week + focus on DASH diet, could consider reduction of medication in future.  Urine ALB 80, continue Losartan for kidney protection.  Return in 6 months.

## 2022-08-10 NOTE — Assessment & Plan Note (Signed)
Acute for 2 weeks.  UA overall stable, will send for culture to further assess.  Wet prep with + clue cells.  Will start Flagyl BID for 7 days and educated her on findings + treatment plan.  Return for worsening symptoms.

## 2022-08-10 NOTE — Progress Notes (Signed)
BP 108/71   Pulse (!) 57   Temp 98.3 F (36.8 C) (Oral)   Ht 5' 4.5" (1.638 m)   Wt 192 lb 6.4 oz (87.3 kg)   LMP  (LMP Unknown)   SpO2 98%   BMI 32.52 kg/m    Subjective:    Patient ID: Leslie Turner, female    DOB: [birth year], 66 y.o.   MRN: [id number]  HPI: Leslie Turner is a 66 y.o. female  Chief Complaint  Patient presents with   Hypothyroidism   Hyperlipidemia   Hypertension   Mood   HYPERTENSION / HYPERLIPIDEMIA Satisfied with current treatment? yes Duration of hypertension: chronic BP monitoring frequency: not checking BP range:  BP medication side effects: no Past BP meds: HCTZ and losartan (cozaar) Duration of hyperlipidemia: chronic Cholesterol medication side effects: no Cholesterol supplements: none Past cholesterol medications: atorvastain (lipitor) Medication compliance: good compliance Aspirin: no Recent stressors: no Recurrent headaches: no Visual changes: no Palpitations: no Dyspnea: no Chest pain: no Lower extremity edema: no Dizzy/lightheaded: no    HYPOTHYROIDISM Thyroid control status:controlled Satisfied with current treatment? yes Medication side effects: no Medication compliance: good compliance Etiology of hypothyroidism:  Recent dose adjustment:no Fatigue: no Cold intolerance: no Heat intolerance: no Weight gain: no Weight loss: no Constipation: no Diarrhea/loose stools: no Palpitations: no Lower extremity edema: no Anxiety/depressed mood: no   URINARY SYMPTOMS About two weeks having sensation she has to go but then can not + odor. Dysuria: no Urinary frequency: no Urgency: no Small volume voids: yes Symptom severity: yes Urinary incontinence: no Foul odor: yes Hematuria: no Abdominal pain: no Back pain: no Suprapubic pain/pressure: no Flank pain: no Fever:  no Vomiting: no Status: stable Previous urinary tract infection: yes Recurrent urinary tract infection: no Sexual activity: monogamous History  of sexually transmitted disease: no Treatments attempted: increasing fluids     ANXIETY/DEPRESSION Taking 50 MG of Zoloft at this time. Controlled levels Anxious mood: no  Excessive worrying: no Irritability: no  Sweating: no Nausea: no Palpitations:no Hyperventilation: no Panic attacks: no Agoraphobia: no  Obscessions/compulsions: no Depressed mood: no    08/10/2022    9:34 AM 05/30/2022   11:32 AM 02/08/2022    8:59 AM 08/10/2021   10:35 AM 02/08/2021    9:37 AM  Depression screen PHQ 2/9  Decreased Interest 0 0 0 0 0  Down, Depressed, Hopeless 0 0 0 0 0  PHQ - 2 Score 0 0 0 0 0  Altered sleeping 0 1 0 0 0  Tired, decreased energy 0 0 1 1 0  Change in appetite 0 0 0 0 0  Feeling bad or failure about yourself  0 0 0 0 0  Trouble concentrating 0 0 0 0 0  Moving slowly or fidgety/restless 0 0 0 0 0  Suicidal thoughts 0 0 0 0 0  PHQ-9 Score 0 '1 1 1 ' 0  Difficult doing work/chores Not difficult at all Not difficult at all  Not difficult at all Not difficult at all     Relevant past medical, surgical, family and social history reviewed and updated as indicated. Interim medical history since our last visit reviewed. Allergies and medications reviewed and updated.  Review of Systems  Constitutional:  Negative for activity change, appetite change, diaphoresis, fatigue and fever.  Respiratory:  Negative for cough, chest tightness and shortness of breath.   Cardiovascular:  Negative for chest pain, palpitations and leg swelling.  Gastrointestinal: Negative.   Endocrine: Negative for cold intolerance  and heat intolerance.  Genitourinary:  Positive for decreased urine volume and difficulty urinating. Negative for dysuria, frequency, hematuria, urgency and vaginal discharge.  Neurological: Negative.   Psychiatric/Behavioral:  Negative for decreased concentration, self-injury, sleep disturbance and suicidal ideas. The patient is not nervous/anxious.     Per HPI unless specifically  indicated above     Objective:    BP 108/71   Pulse (!) 57   Temp 98.3 F (36.8 C) (Oral)   Ht 5' 4.5" (1.638 m)   Wt 192 lb 6.4 oz (87.3 kg)   LMP  (LMP Unknown)   SpO2 98%   BMI 32.52 kg/m   Wt Readings from Last 3 Encounters:  08/10/22 192 lb 6.4 oz (87.3 kg)  05/30/22 193 lb (87.5 kg)  02/08/22 194 lb 3.2 oz (88.1 kg)    Physical Exam Vitals and nursing note reviewed.  Constitutional:      General: She is not in acute distress.    Appearance: Normal appearance. She is obese. She is not ill-appearing or toxic-appearing.  HENT:     Head: Normocephalic.  Eyes:     General: Lids are normal.  Neck:     Thyroid: No thyroid mass, thyromegaly or thyroid tenderness.  Cardiovascular:     Rate and Rhythm: Normal rate and regular rhythm.     Heart sounds: Normal heart sounds, S1 normal and S2 normal.  Pulmonary:     Effort: Pulmonary effort is normal.     Breath sounds: Normal breath sounds and air entry.  Abdominal:     General: Bowel sounds are normal. There is no distension.     Palpations: Abdomen is soft. There is no mass.     Tenderness: There is no abdominal tenderness.  Lymphadenopathy:     Cervical: No cervical adenopathy.     Right cervical: No superficial or posterior cervical adenopathy.    Left cervical: No superficial or posterior cervical adenopathy.     Upper Body:     Right upper body: No supraclavicular adenopathy.     Left upper body: No supraclavicular adenopathy.  Neurological:     Mental Status: She is alert and oriented to person, place, and time.     Deep Tendon Reflexes: Reflexes are normal and symmetric.     Reflex Scores:      Patellar reflexes are 2+ on the right side and 2+ on the left side. Psychiatric:        Attention and Perception: Attention normal.        Mood and Affect: Mood normal. Mood is not depressed.        Speech: Speech normal.        Behavior: Behavior normal. Behavior is cooperative.        Thought Content: Thought  content normal.     Results for orders placed or performed in visit on 02/08/22  Microalbumin, Urine Waived  Result Value Ref Range   Microalb, Ur Waived 80 (H) 0 - 19 mg/L   Creatinine, Urine Waived 300 10 - 300 mg/dL   Microalb/Creat Ratio 30-300 (H) <30 mg/g  CBC with Differential/Platelet  Result Value Ref Range   WBC 6.5 3.4 - 10.8 x10E3/uL   RBC 4.59 3.77 - 5.28 x10E6/uL   Hemoglobin 13.7 11.1 - 15.9 g/dL   Hematocrit 42.3 34.0 - 46.6 %   MCV 92 79 - 97 fL   MCH 29.8 26.6 - 33.0 pg   MCHC 32.4 31.5 - 35.7 g/dL   RDW 12.8 11.7 -  15.4 %   Platelets 226 150 - 450 x10E3/uL   Neutrophils 45 Not Estab. %   Lymphs 39 Not Estab. %   Monocytes 7 Not Estab. %   Eos 8 Not Estab. %   Basos 1 Not Estab. %   Neutrophils Absolute 2.9 1.4 - 7.0 x10E3/uL   Lymphocytes Absolute 2.5 0.7 - 3.1 x10E3/uL   Monocytes Absolute 0.4 0.1 - 0.9 x10E3/uL   EOS (ABSOLUTE) 0.6 (H) 0.0 - 0.4 x10E3/uL   Basophils Absolute 0.1 0.0 - 0.2 x10E3/uL   Immature Granulocytes 0 Not Estab. %   Immature Grans (Abs) 0.0 0.0 - 0.1 x10E3/uL  Comprehensive metabolic panel  Result Value Ref Range   Glucose 101 (H) 70 - 99 mg/dL   BUN 20 8 - 27 mg/dL   Creatinine, Ser 0.87 0.57 - 1.00 mg/dL   eGFR 74 >59 mL/min/1.73   BUN/Creatinine Ratio 23 12 - 28   Sodium 144 134 - 144 mmol/L   Potassium 3.7 3.5 - 5.2 mmol/L   Chloride 104 96 - 106 mmol/L   CO2 25 20 - 29 mmol/L   Calcium 9.4 8.7 - 10.3 mg/dL   Total Protein 6.4 6.0 - 8.5 g/dL   Albumin 4.2 3.8 - 4.8 g/dL   Globulin, Total 2.2 1.5 - 4.5 g/dL   Albumin/Globulin Ratio 1.9 1.2 - 2.2   Bilirubin Total 0.3 0.0 - 1.2 mg/dL   Alkaline Phosphatase 89 44 - 121 IU/L   AST 23 0 - 40 IU/L   ALT 16 0 - 32 IU/L  Lipid Panel w/o Chol/HDL Ratio  Result Value Ref Range   Cholesterol, Total 167 100 - 199 mg/dL   Triglycerides 141 0 - 149 mg/dL   HDL 55 >39 mg/dL   VLDL Cholesterol Cal 25 5 - 40 mg/dL   LDL Chol Calc (NIH) 87 0 - 99 mg/dL  TSH  Result Value Ref  Range   TSH 1.750 0.450 - 4.500 uIU/mL  VITAMIN D 25 Hydroxy (Vit-D Deficiency, Fractures)  Result Value Ref Range   Vit D, 25-Hydroxy 31.7 30.0 - 100.0 ng/mL  Thyroid peroxidase antibody  Result Value Ref Range   Thyroperoxidase Ab SerPl-aCnc 42 (H) 0 - 34 IU/mL  T4, free  Result Value Ref Range   Free T4 1.48 0.82 - 1.77 ng/dL      Assessment & Plan:   Problem List Items Addressed This Visit       Cardiovascular and Mediastinum   Essential hypertension, benign    Chronic, stable.  BP well below goal.  Continue current medication regimen and adjust as needed.  Obtain BMP.  Recommend checking BP at home three days a week + focus on DASH diet, could consider reduction of medication in future.  Urine ALB 80, continue Losartan for kidney protection.  Return in 6 months.      Relevant Orders   Basic metabolic panel     Endocrine   Hashimoto's thyroiditis    Chronic, stable.  Continue current medication regimen and adjust as needed.  Labs up to date and stable.        Other   Chronic depression - Primary    Chronic, stable.  Denies SI/HI.  Continue current medication regimen and adjust as needed, continue to reduce as tolerated.  Check LFTs today.        Generalized anxiety disorder    Chronic, stable.  Continue Sertraline 50 MG and Vistaril as needed (using very minimally).  Denies SI/HI.  Return to  office in 6 months.      Hyperlipidemia    Chronic, stable.  Continue current medication regimen and adjust as needed.  Obtain CMP and lipid panel today.      Relevant Orders   Lipid Panel w/o Chol/HDL Ratio   Urinary tract infection symptoms    Acute for 2 weeks.  UA overall stable, will send for culture to further assess.  Wet prep with + clue cells.  Will start Flagyl BID for 7 days and educated her on findings + treatment plan.  Return for worsening symptoms.      Relevant Orders   Urinalysis, Routine w reflex microscopic   WET PREP FOR TRICH, YEAST, CLUE   Other  Visit Diagnoses     Pyuria       Urine sent for culture.   Relevant Orders   Urine Culture   Colon cancer screening       Cologuard ordered today.   Relevant Orders   Cologuard        Follow up plan: Return in about 6 months (around 02/09/2023) for Annual physical.

## 2022-08-10 NOTE — Assessment & Plan Note (Signed)
Chronic, stable.  Continue current medication regimen and adjust as needed.  Obtain CMP and lipid panel today.

## 2022-08-11 LAB — BASIC METABOLIC PANEL
BUN/Creatinine Ratio: 18 (ref 12–28)
BUN: 18 mg/dL (ref 8–27)
CO2: 23 mmol/L (ref 20–29)
Calcium: 9.5 mg/dL (ref 8.7–10.3)
Chloride: 103 mmol/L (ref 96–106)
Creatinine, Ser: 1.02 mg/dL — ABNORMAL HIGH (ref 0.57–1.00)
Glucose: 97 mg/dL (ref 70–99)
Potassium: 4 mmol/L (ref 3.5–5.2)
Sodium: 141 mmol/L (ref 134–144)
eGFR: 61 mL/min/{1.73_m2} (ref >59)

## 2022-08-11 LAB — LIPID PANEL W/O CHOL/HDL RATIO
Cholesterol, Total: 167 mg/dL (ref 100–199)
HDL: 61 mg/dL (ref >39)
LDL Chol Calc (NIH): 87 mg/dL (ref 0–99)
Triglycerides: 107 mg/dL (ref 0–149)
VLDL Cholesterol Cal: 19 mg/dL (ref 5–40)

## 2022-08-11 NOTE — Progress Notes (Signed)
Contacted via [name] afternoon Leslie Turner, your labs have returned.  Also Leslie Turner loves the camera!!  He was so excited, he immediately started to [employer] where to get film.  Thank you!!! - Kidney function, creatinine and eGFR, remains normal. - Cholesterol labs remain stable, would like to see LDL <70, we may adjust Atorvastatin in future.  Overall great labs!!  Any questions? Keep being amazing!!  Thank you for allowing me to participate in your care.  I appreciate you. Kindest regards, Kiva [place]

## 2022-08-12 LAB — URINE CULTURE

## 2022-08-12 NOTE — Progress Notes (Signed)
Contacted via [place] afternoon Courtny, your urine is showing no infection:)

## 2022-08-31 ENCOUNTER — Ambulatory Visit (INDEPENDENT_AMBULATORY_CARE_PROVIDER_SITE_OTHER): Payer: Medicare Other | Admitting: *Deleted

## 2022-08-31 DIAGNOSIS — Z0000 Encounter for general adult medical examination without abnormal findings: Secondary | ICD-10-CM | POA: Diagnosis not present

## 2022-08-31 NOTE — Patient Instructions (Signed)
Leslie Turner , Thank you for taking time to come for your Medicare Wellness Visit. I appreciate your ongoing commitment to your health goals. Please review the following plan we discussed and let me know if I can assist you in the future.   These are the goals we discussed:  Goals      DIET - INCREASE WATER INTAKE        This is a list of the screening recommended for you and due dates:  Health Maintenance  Topic Date Due   Cologuard (Stool DNA test)  05/31/2023*   COVID-19 Vaccine (6 - Pfizer series) 11/26/2022   Pneumonia Vaccine (2 - PPSV23 or PCV20) 02/09/2023   Medicare Annual Wellness Visit  09/01/2023   Pap Smear  12/15/2023   Mammogram  01/24/2024   DEXA scan (bone density measurement)  01/24/2027   Tetanus Vaccine  06/21/2032   Flu Shot  Completed   Hepatitis C Screening: USPSTF Recommendation to screen - Ages 18-79 yo.  Completed   HIV Screening  Completed   Zoster (Shingles) Vaccine  Completed   HPV Vaccine  Aged Out   Colon Cancer Screening  Discontinued  *Topic was postponed. The date shown is not the original due date.    Advanced directives: yes not on file  Conditions/risks identified:   Next appointment: Follow up in one year for your annual wellness visit  02-11-2022 8:40 [place] 65 Years and Older, Female Preventive care refers to lifestyle choices and visits with your health care provider that can promote health and wellness. What does preventive care include? A yearly physical exam. This is also called an annual well check. Dental exams once or twice a year. Routine eye exams. Ask your health care provider how often you should have your eyes checked. Personal lifestyle choices, including: Daily care of your teeth and gums. Regular physical activity. Eating a healthy diet. Avoiding tobacco and drug use. Limiting alcohol use. Practicing safe sex. Taking low-dose aspirin every day. Taking vitamin and mineral supplements as  recommended by your health care provider. What happens during an annual well check? The services and screenings done by your health care provider during your annual well check will depend on your age, overall health, lifestyle risk factors, and family history of disease. Counseling  Your health care provider may ask you questions about your: Alcohol use. Tobacco use. Drug use. Emotional well-being. Home and relationship well-being. Sexual activity. Eating habits. History of falls. Memory and ability to understand (cognition). Work and work Statistician. Reproductive health. Screening  You may have the following tests or measurements: Height, weight, and BMI. Blood pressure. Lipid and cholesterol levels. These may be checked every 5 years, or more frequently if you are over 62 years old. Skin check. Lung cancer screening. You may have this screening every year starting at age 20 if you have a 30-pack-year history of smoking and currently smoke or have quit within the past 15 years. Fecal occult blood test (FOBT) of the stool. You may have this test every year starting at age 73. Flexible sigmoidoscopy or colonoscopy. You may have a sigmoidoscopy every 5 years or a colonoscopy every 10 years starting at age 13. Hepatitis C blood test. Hepatitis B blood test. Sexually transmitted disease (STD) testing. Diabetes screening. This is done by checking your blood sugar (glucose) after you have not eaten for a while (fasting). You may have this done every 1-3 years. Bone density scan. This is done to screen for  osteoporosis. You may have this done starting at age 33. Mammogram. This may be done every 1-2 years. Talk to your health care provider about how often you should have regular mammograms. Talk with your health care provider about your test results, treatment options, and if necessary, the need for more tests. Vaccines  Your health care provider may recommend certain vaccines, such  as: Influenza vaccine. This is recommended every year. Tetanus, diphtheria, and acellular pertussis (Tdap, Td) vaccine. You may need a Td booster every 10 years. Zoster vaccine. You may need this after age 54. Pneumococcal 13-valent conjugate (PCV13) vaccine. One dose is recommended after age 76. Pneumococcal polysaccharide (PPSV23) vaccine. One dose is recommended after age 68. Talk to your health care provider about which screenings and vaccines you need and how often you need them. This information is not intended to replace advice given to you by your health care provider. Make sure you discuss any questions you have with your health care provider. Document Released: 11/12/2015 Document Revised: 07/05/2016 Document Reviewed: 08/17/2015 Elsevier Interactive Patient Education  2017 [employer] Prevention in the Home Falls can cause injuries. They can happen to people of all ages. There are many things you can do to make your home safe and to help prevent falls. What can I do on the outside of my home? Regularly fix the edges of walkways and driveways and fix any cracks. Remove anything that might make you trip as you walk through a door, such as a raised step or threshold. Trim any bushes or trees on the path to your home. Use bright outdoor lighting. Clear any walking paths of anything that might make someone trip, such as rocks or tools. Regularly check to see if handrails are loose or broken. Make sure that both sides of any steps have handrails. Any raised decks and porches should have guardrails on the edges. Have any leaves, snow, or ice cleared regularly. Use sand or salt on walking paths during winter. Clean up any spills in your garage right away. This includes oil or grease spills. What can I do in the bathroom? Use night lights. Install grab bars by the toilet and in the tub and shower. Do not use towel bars as grab bars. Use non-skid mats or decals in the tub or  shower. If you need to sit down in the shower, use a plastic, non-slip stool. Keep the floor dry. Clean up any water that spills on the floor as soon as it happens. Remove soap buildup in the tub or shower regularly. Attach bath mats securely with double-sided non-slip rug tape. Do not have throw rugs and other things on the floor that can make you trip. What can I do in the bedroom? Use night lights. Make sure that you have a light by your bed that is easy to reach. Do not use any sheets or blankets that are too big for your bed. They should not hang down onto the floor. Have a firm chair that has side arms. You can use this for support while you get dressed. Do not have throw rugs and other things on the floor that can make you trip. What can I do in the kitchen? Clean up any spills right away. Avoid walking on wet floors. Keep items that you use a lot in easy-to-reach places. If you need to reach something above you, use a strong step stool that has a grab bar. Keep electrical cords out of the way. Do not  use floor polish or wax that makes floors slippery. If you must use wax, use non-skid floor wax. Do not have throw rugs and other things on the floor that can make you trip. What can I do with my stairs? Do not leave any items on the stairs. Make sure that there are handrails on both sides of the stairs and use them. Fix handrails that are broken or loose. Make sure that handrails are as long as the stairways. Check any carpeting to make sure that it is firmly attached to the stairs. Fix any carpet that is loose or worn. Avoid having throw rugs at the top or bottom of the stairs. If you do have throw rugs, attach them to the floor with carpet tape. Make sure that you have a light switch at the top of the stairs and the bottom of the stairs. If you do not have them, ask someone to add them for you. What else can I do to help prevent falls? Wear shoes that: Do not have high heels. Have  rubber bottoms. Are comfortable and fit you well. Are closed at the toe. Do not wear sandals. If you use a stepladder: Make sure that it is fully opened. Do not climb a closed stepladder. Make sure that both sides of the stepladder are locked into place. Ask someone to hold it for you, if possible. Clearly mark and make sure that you can see: Any grab bars or handrails. First and last steps. Where the edge of each step is. Use tools that help you move around (mobility aids) if they are needed. These include: Canes. Walkers. Scooters. Crutches. Turn on the lights when you go into a dark area. Replace any light bulbs as soon as they burn out. Set up your furniture so you have a clear path. Avoid moving your furniture around. If any of your floors are uneven, fix them. If there are any pets around you, be aware of where they are. Review your medicines with your doctor. Some medicines can make you feel dizzy. This can increase your chance of falling. Ask your doctor what other things that you can do to help prevent falls. This information is not intended to replace advice given to you by your health care provider. Make sure you discuss any questions you have with your health care provider. Document Released: 08/12/2009 Document Revised: 03/23/2016 Document Reviewed: 11/20/2014 Elsevier Interactive Patient Education  2017 [employer].

## 2022-08-31 NOTE — Progress Notes (Signed)
Subjective:   Leslie Turner is a 66 y.o. female who presents for Medicare Annual (Subsequent) preventive examination.  I connected with  MADYN IVINS on 08/31/22 by a telephone enabled telemedicine application and verified that I am speaking with the correct person using two identifiers.   I discussed the limitations of evaluation and management by telemedicine. The patient expressed understanding and agreed to proceed.  Patient location: home  Provider location:   Tele-health-home   Review of Systems           Objective:    There were no vitals filed for this visit. There is no height or weight on file to calculate BMI.     08/31/2022   11:42 AM  Advanced Directives  Does Patient Have a Medical Advance Directive? No  Would patient like information on creating a medical advance directive? No - Patient declined    Current Medications (verified) Outpatient Encounter Medications as of 08/31/2022  Medication Sig   atorvastatin (LIPITOR) 40 MG tablet Take 1 tablet (40 mg total) by mouth daily.   cholecalciferol (VITAMIN D) 1000 UNITS tablet Take 1,000 Units by mouth daily.   hydrochlorothiazide (HYDRODIURIL) 25 MG tablet Take 0.5 tablets (12.5 mg total) by mouth daily.   hydrOXYzine (ATARAX/VISTARIL) 25 MG tablet TAKE 1 TABLET (25 MG TOTAL) BY MOUTH 2 (TWO) TIMES DAILY AS NEEDED.   levothyroxine (SYNTHROID) 112 MCG tablet Take 1 tablet (112 mcg total) by mouth daily before breakfast.   losartan (COZAAR) 50 MG tablet Take 1 tablet (50 mg total) by mouth daily.   meloxicam (MOBIC) 15 MG tablet Take 1 tablet (15 mg total) by mouth daily.   Multiple Vitamins-Minerals (CENTRUM SILVER 50+WOMEN PO) Take 1 tablet by mouth daily.   sertraline (ZOLOFT) 100 MG tablet Take 0.5 tablets (50 mg total) by mouth daily.   No facility-administered encounter medications on file as of 08/31/2022.    Allergies (verified) Patient has no known allergies.   History: Past Medical History:   Diagnosis Date   Anxiety    Carotid stenosis    Depression    GERD (gastroesophageal reflux disease)    Hyperlipidemia    Hypertension    Metabolic syndrome    Microscopic hematuria    Pain in knee    Thyroid disease    hypothyroid   Vitamin D deficiency    Past Surgical History:  Procedure Laterality Date   APPENDECTOMY     ENDOMETRIAL BIOPSY  06/15/2009   disordered prolif. phase   FOOT NEUROMA SURGERY Left    Family History  Problem Relation Age of Onset   Arthritis Mother    Cancer Mother 1       colon   Diabetes Father    Heart disease Father    Hypertension Father    Breast cancer Paternal Aunt    Heart disease Paternal Grandfather        MI   Heart disease Brother    Lung cancer Brother    Social History   Socioeconomic History   Marital status: Married    Spouse name: Elta Guadel[place]   Number of children: 2   Years of education: Not on file   Highest education level: Not on file  Occupational History   Not on file  Tobacco Use   Smoking status: Former   Smokeless tobacco: Never   Tobacco comments:    smoked for 12-14 years and quit cold [place]  Vaping Use   Vaping Use: Never used  Substance  and Sexual Activity   Alcohol use: No    Alcohol/week: 0.0 standard drinks of alcohol    Comment: occ   Drug use: No   Sexual activity: Not Currently    Partners: Male  Other Topics Concern   Not on file  Social History Narrative   Not on file   Social Determinants of Health   Financial Resource Strain: Not on file  Food Insecurity: Not on file  Transportation Needs: Not on file  Physical Activity: Not on file  Stress: Not on file  Social Connections: Not on file    Tobacco Counseling Counseling given: Not Answered Tobacco comments: smoked for 12-14 years and quit cold [place]   Clinical Intake:  Pre-visit preparation completed: Yes  Pain : No/denies pain     Diabetes: No  How often do you need to have someone help you when you read  instructions, pamphlets, or other written materials from your doctor or pharmacy?: 1 - Never  Diabetic?  no  Interpreter Needed?: No  Information entered by :: Leroy Kennedy LPN   Activities of Daily Living     No data to display          Patient Care Team: Venita Lick, NP as PCP - General (Nurse Practitioner)  Indicate any recent Medical Services you may have received from other than [facility] providers in the past year (date may be approximate).     Assessment:   This is a routine wellness examination for [facility].  Hearing/Vision screen Hearing Screening - Comments:: No trouble hearing Vision Screening - Comments:: Up to date [facility]   Dietary issues and exercise activities discussed:     Goals Addressed   None    Depression Screen    08/10/2022    9:34 AM 05/30/2022   11:32 AM 02/08/2022    8:59 AM 08/10/2021   10:35 AM 02/08/2021    9:37 AM 12/14/2020    8:46 AM 08/10/2020    1:52 PM  PHQ 2/9 Scores  PHQ - 2 Score 0 0 0 0 0 0 0  PHQ- 9 Score 0 '1 1 1 '$ 0 0 3    Fall Risk    08/31/2022   11:42 AM 08/10/2022    9:34 AM 05/30/2022   11:28 AM 02/08/2022    9:18 AM 02/08/2022    9:00 AM  Fall Risk   Falls in the past year? 0 0 0 0 0  Number falls in past yr: 0 0 0 0 0  Injury with Fall? 0 0 0 0 0  Risk for fall due to :  No Fall Risks No Fall Risks No Fall Risks No Fall Risks  Follow up Falls evaluation completed;Education provided;Falls prevention discussed Falls evaluation completed Falls evaluation completed Education provided Falls evaluation completed    FALL RISK PREVENTION PERTAINING TO THE HOME:  Any stairs in or around the home? Yes  If so, are there any without handrails? No  Home free of loose throw rugs in walkways, pet beds, electrical cords, etc? Yes  Adequate lighting in your home to reduce risk of falls? Yes   ASSISTIVE DEVICES UTILIZED TO PREVENT FALLS:  Life alert? No  Use of a cane, walker or w/c? No  Grab bars in the bathroom?  Yes  Shower chair or bench in shower? Yes  Elevated toilet seat or a handicapped toilet? Yes   TIMED UP AND GO:  Was the test performed? No .    Cognitive Function:  08/31/2022   11:43 AM  6CIT Screen  What Year? 0 points  What month? 0 points  What time? 0 points  Count back from 20 0 points  Months in reverse 0 points  Repeat phrase 0 points  Total Score 0 points    Immunizations Immunization History  Administered Date(s) Administered   Influenza, High Dose Seasonal PF 07/27/2022   Influenza,inj,Quad PF,6+ Mos 07/23/2019, 08/10/2020, 08/10/2021   Influenza-Unspecified 08/08/2016, 08/07/2017, 09/03/2018   PFIZER Comirnaty(Gray Top)Covid-19 Tri-Sucrose Vaccine 06/01/2021, 07/27/2022   PFIZER(Purple Top)SARS-COV-2 Vaccination 01/17/2020, 02/11/2020, 08/31/2020   Pfizer Covid-19 Vaccine Bivalent Booster 36yr & up 11/03/2021   Pneumococcal Conjugate-13 02/08/2022   Td 11/17/2000   Tdap 01/24/2011, 08/10/2021, 06/21/2022   Zoster Recombinat (Shingrix) 11/01/2018, 05/26/2019   Zoster, Live 07/23/2012    TDAP status: Up to date  Flu Vaccine status: Up to date  Pneumococcal vaccine status: Up to date  Covid-19 vaccine status: Information provided on how to obtain vaccines.   Qualifies for Shingles Vaccine? No   Zostavax completed Yes   Shingrix Completed?: Yes  Screening Tests Health Maintenance  Topic Date Due   Fecal DNA (Cologuard)  05/31/2023 (Originally 05/06/2022)   COVID-19 Vaccine (6 - Pfizer series) 11/26/2022   Pneumonia Vaccine 66 Years old (2 - PPSV23 or PCV20) 02/09/2023   Medicare Annual Wellness (AWV)  09/01/2023   PAP SMEAR-Modifier  12/15/2023   MAMMOGRAM  01/24/2024   DEXA SCAN  01/24/2027   TETANUS/TDAP  06/21/2032   INFLUENZA VACCINE  Completed   Hepatitis C Screening  Completed   HIV Screening  Completed   Zoster Vaccines- Shingrix  Completed   HPV VACCINES  Aged Out   COLONOSCOPY (Pts 45-467yrInsurance coverage will need to be  confirmed)  Discontinued    Health Maintenance  There are no preventive care reminders to display for this patient.   Colonoscopy -  Patient has Cologuard at home  Mammogram status: Completed  . Repeat every year  Bone Density status: Completed 2023. Results reflect: Bone density results: OSTEOPENIA. Repeat every 5 years.  Lung Cancer Screening: (Low Dose CT Chest recommended if Age 66-80ears, 30 pack-year currently smoking OR have quit w/in 15years.) does not qualify.   Lung Cancer Screening Referral:   Additional Screening:  Hepatitis C Screening: does not qualify; Completed   Vision Screening: Recommended annual ophthalmology exams for early detection of glaucoma and other disorders of the eye. Is the patient up to date with their annual eye exam?  Yes  Who is the provider or what is the name of the office in which the patient attends annual eye exams? [facility] with a provider, would they like to be referred to a provider to establish care? No .   Dental Screening: Recommended annual dental exams for proper oral hygiene  Community Resource Referral / Chronic Care Management: CRR required this visit?  No   CCM required this visit?  No      Plan:     I have personally reviewed and noted the following in the patient's chart:   Medical and social history Use of alcohol, tobacco or illicit drugs  Current medications and supplements including opioid prescriptions. Patient is not currently taking opioid prescriptions. Functional ability and status Nutritional status Physical activity Advanced directives List of other physicians Hospitalizations, surgeries, and ER visits in previous 12 months Vitals Screenings to include cognitive, depression, and falls Referrals and appointments  In addition, I have reviewed and discussed with  patient certain preventive protocols, quality metrics, and best practice recommendations. A written  personalized care plan for preventive services as well as general preventive health recommendations were provided to patient.     Leroy Kennedy, LPN   [id number]   Nurse Notes:

## 2022-09-17 LAB — COLOGUARD: COLOGUARD: NEGATIVE

## 2022-09-17 NOTE — Progress Notes (Signed)
Contacted via MyChart   Good morning Leslie Turner -- your Cologuard returned negative!!  Do not need to repeat for 3 years:)

## 2022-10-01 NOTE — Patient Instructions (Signed)

## 2022-10-03 ENCOUNTER — Ambulatory Visit (INDEPENDENT_AMBULATORY_CARE_PROVIDER_SITE_OTHER): Payer: Medicare Other | Admitting: Nurse Practitioner

## 2022-10-03 ENCOUNTER — Encounter: Payer: Self-pay | Admitting: Nurse Practitioner

## 2022-10-03 VITALS — BP 126/82 | HR 57 | Temp 97.8°F | Ht 64.49 in | Wt 195.9 lb

## 2022-10-03 DIAGNOSIS — M170 Bilateral primary osteoarthritis of knee: Secondary | ICD-10-CM | POA: Diagnosis not present

## 2022-10-03 NOTE — Assessment & Plan Note (Signed)
Chronic, ongoing.  Has received steroid injections to knees in past and is very knowledgeable of procedure and risks/benefits, further discussed today.  Verbal consent given and injection performed to right knee.  Recommend she ice area every couple hours for 24 hours.  Continue simple treatment at home, OTC creams.  Has script for Celecoxib to use as needed only daily.  Recommend focus on exercise regimen and modest weight loss.  Return as scheduled.  Recommend ortho referral, but she wishes to wait.

## 2022-10-03 NOTE — Progress Notes (Signed)
BP 126/82   Pulse (!) 57   Temp 97.8 F (36.6 C) (Oral)   Ht 5' 4.49" (1.638 m)   Wt 195 lb 14.4 oz (88.9 kg)   LMP  (LMP Unknown)   SpO2 99%   BMI 33.12 kg/m    Subjective:    Patient ID: Leslie Turner, female    DOB: 02/06/1956, 66 y.o.   MRN: [id number]  HPI: Leslie Turner is a 66 y.o. female  Chief Complaint  Patient presents with   Knee Pain    Right knee pain   KNEE PAIN Present for one week to right knee -- history of left knee injection August 2022.  Last 05/30/22 (right knee), previous was August 2022 which he tolerated well.  Would like a right knee injection today.  In new year would like injection to left knee and to look into physical therapy.  We have discussed possible need of replacement in future. Duration: weeks Involved knee: right Mechanism of injury: unknown Location:diffuse Onset: gradual Severity: 7/10 Quality:  sharp, dull, aching, and throbbing Frequency: intermittent Radiation: no Aggravating factors: weight bearing, walking, and movement  Alleviating factors: ice, APAP, and rest  Status: fluctuating Treatments attempted: rest, ice, and APAP  Relief with NSAIDs?:  No NSAIDs Taken Weakness with weight bearing or walking: yes Sensation of giving way: yes Locking: no Popping: yes Bruising: no Swelling: no Redness: no Paresthesias/decreased sensation: no Fevers: no   Relevant past medical, surgical, family and social history reviewed and updated as indicated. Interim medical history since our last visit reviewed. Allergies and medications reviewed and updated.  Review of Systems  Constitutional:  Negative for activity change, appetite change, diaphoresis, fatigue and fever.  Respiratory:  Negative for cough, chest tightness and shortness of breath.   Cardiovascular:  Negative for chest pain, palpitations and leg swelling.  Gastrointestinal: Negative.   Musculoskeletal:  Positive for arthralgias.  Neurological: Negative.    Psychiatric/Behavioral: Negative.     Per HPI unless specifically indicated above     Objective:    BP 126/82   Pulse (!) 57   Temp 97.8 F (36.6 C) (Oral)   Ht 5' 4.49" (1.638 m)   Wt 195 lb 14.4 oz (88.9 kg)   LMP  (LMP Unknown)   SpO2 99%   BMI 33.12 kg/m   Wt Readings from Last 3 Encounters:  10/03/22 195 lb 14.4 oz (88.9 kg)  08/10/22 192 lb 6.4 oz (87.3 kg)  05/30/22 193 lb (87.5 kg)    Physical Exam Vitals and nursing note reviewed.  Constitutional:      General: She is awake. She is not in acute distress.    Appearance: She is well-developed, well-groomed and overweight. She is not ill-appearing.  HENT:     Head: Normocephalic.     Right Ear: Hearing normal.     Left Ear: Hearing normal.  Eyes:     General: Lids are normal.        Right eye: No discharge.        Left eye: No discharge.     Conjunctiva/sclera: Conjunctivae normal.     Pupils: Pupils are equal, round, and reactive to light.  Neck:     Thyroid: No thyromegaly.     Vascular: No carotid bruit.  Cardiovascular:     Rate and Rhythm: Normal rate and regular rhythm.     Heart sounds: Normal heart sounds. No murmur heard.    No gallop.  Pulmonary:  Effort: Pulmonary effort is normal. No accessory muscle usage or respiratory distress.     Breath sounds: Normal breath sounds.  Abdominal:     General: Bowel sounds are normal.     Palpations: Abdomen is soft.  Musculoskeletal:     Cervical back: Normal range of motion and neck supple.     Right knee: Crepitus present. No swelling, effusion, erythema or ecchymosis. Decreased range of motion. Tenderness present over the medial joint line and lateral joint line. Normal pulse.     Left knee: Crepitus present. No swelling, effusion, erythema or ecchymosis. Normal pulse.     Right lower leg: No edema.     Left lower leg: No edema.  Skin:    General: Skin is warm and dry.  Neurological:     Mental Status: She is alert and oriented to person, place,  and time.  Psychiatric:        Attention and Perception: Attention normal.        Mood and Affect: Mood normal.        Behavior: Behavior normal. Behavior is cooperative.        Thought Content: Thought content normal.        Judgment: Judgment normal.   STEROID INJECTION Procedure: Right Knee Intraarticular Steroid Injection  Description: After verbal consent and patient education on procedure. Area prepped and draped using semi-sterile technique. Using a anterior  approach, a mixture of 4 cc of 1% Marcaine & 1 cc of Kenalog 40 was injected into knee joint.  A bandage was then placed over the injection site.  Complications: none Post Procedure Instructions: To the ER if any symptoms of erythema or swelling.   Follow Up: PRN   Results for orders placed or performed in visit on 08/10/22  WET PREP FOR [place], YEAST, CLUE   Specimen: Sterile Swab   Sterile Swab  Result Value Ref Range   Trichomonas Exam Negative Negative   Yeast Exam Negative Negative   Clue Cell Exam Positive (A) Negative  Urine Culture   Specimen: Urine   UR  Result Value Ref Range   Urine Culture, Routine Final report    Organism ID, Bacteria Comment   Microscopic Examination   Urine  Result Value Ref Range   WBC, UA 0-5 0 - 5 /hpf   RBC, Urine 0-2 0 - 2 /hpf   Epithelial Cells (non renal) 0-10 0 - 10 /hpf   Bacteria, UA None seen None seen/Few  Basic metabolic panel  Result Value Ref Range   Glucose 97 70 - 99 mg/dL   BUN 18 8 - 27 mg/dL   Creatinine, Ser 1.02 (H) 0.57 - 1.00 mg/dL   eGFR 61 >59 mL/min/1.73   BUN/Creatinine Ratio 18 12 - 28   Sodium 141 134 - 144 mmol/L   Potassium 4.0 3.5 - 5.2 mmol/L   Chloride 103 96 - 106 mmol/L   CO2 23 20 - 29 mmol/L   Calcium 9.5 8.7 - 10.3 mg/dL  Lipid Panel w/o Chol/HDL Ratio  Result Value Ref Range   Cholesterol, Total 167 100 - 199 mg/dL   Triglycerides 107 0 - 149 mg/dL   HDL 61 >39 mg/dL   VLDL Cholesterol Cal 19 5 - 40 mg/dL   LDL Chol Calc (NIH) 87  0 - 99 mg/dL  Cologuard  Result Value Ref Range   COLOGUARD Negative Negative  Urinalysis, Routine w reflex microscopic  Result Value Ref Range   Specific Gravity, UA 1.020 1.005 -  1.030   pH, UA 7.5 5.0 - 7.5   Color, UA Yellow Yellow   Appearance Ur Clear Clear   Leukocytes,UA Trace (A) Negative   Protein,UA Trace (A) Negative/Trace   Glucose, UA Negative Negative   Ketones, UA Trace (A) Negative   RBC, UA Negative Negative   Bilirubin, UA Negative Negative   Urobilinogen, Ur 0.2 0.2 - 1.0 mg/dL   Nitrite, UA Negative Negative   Microscopic Examination See below:       Assessment & Plan:   Problem List Items Addressed This Visit       Musculoskeletal and Integument   Primary osteoarthritis of both knees - Primary    Chronic, ongoing.  Has received steroid injections to knees in past and is very knowledgeable of procedure and risks/benefits, further discussed today.  Verbal consent given and injection performed to right knee.  Recommend she ice area every couple hours for 24 hours.  Continue simple treatment at home, OTC creams.  Has script for Celecoxib to use as needed only daily.  Recommend focus on exercise regimen and modest weight loss.  Return as scheduled.  Recommend ortho referral, but she wishes to wait.        Follow up plan: Return if symptoms worsen or fail to improve.

## 2022-11-24 ENCOUNTER — Encounter: Payer: Self-pay | Admitting: Nurse Practitioner

## 2022-11-27 ENCOUNTER — Encounter: Payer: Self-pay | Admitting: Nurse Practitioner

## 2022-11-28 ENCOUNTER — Telehealth: Payer: Medicare Other | Admitting: Physician Assistant

## 2022-11-28 ENCOUNTER — Encounter: Payer: Self-pay | Admitting: Nurse Practitioner

## 2022-11-28 ENCOUNTER — Ambulatory Visit: Payer: Self-pay

## 2022-11-28 DIAGNOSIS — U071 COVID-19: Secondary | ICD-10-CM | POA: Diagnosis not present

## 2022-11-28 MED ORDER — BENZONATATE 100 MG PO CAPS: 100.0000 mg | ORAL_CAPSULE | Freq: Three times a day (TID) | ORAL | 0 refills | Status: DC | PRN

## 2022-11-28 MED ORDER — NIRMATRELVIR/RITONAVIR (PAXLOVID)TABLET: 3.0000 | ORAL_TABLET | Freq: Two times a day (BID) | ORAL | 0 refills | Status: AC

## 2022-11-28 NOTE — Patient Instructions (Signed)
Leslie Turner, thank you for joining [name], PA-C for today's virtual visit.  While this provider is not your primary care provider (PCP), if your PCP is located in our provider database this encounter information will be shared with them immediately following your visit.   [facility] account gives you access to today's visit and all your visits, tests, and labs performed at [facility] " click here if you don't have a [facility] account or go to [email or web address]  Consent: (Patient) Leslie Turner provided verbal consent for this virtual visit at the beginning of the encounter.  Current Medications:  Current Outpatient Medications:    atorvastatin (LIPITOR) 40 MG tablet, Take 1 tablet (40 mg total) by mouth daily., Disp: 90 tablet, Rfl: 4   cholecalciferol (VITAMIN D) 1000 UNITS tablet, Take 1,000 Units by mouth daily., Disp: , Rfl:    hydrochlorothiazide (HYDRODIURIL) 25 MG tablet, Take 0.5 tablets (12.5 mg total) by mouth daily., Disp: 45 tablet, Rfl: 4   hydrOXYzine (ATARAX/VISTARIL) 25 MG tablet, TAKE 1 TABLET (25 MG TOTAL) BY MOUTH 2 (TWO) TIMES DAILY AS NEEDED., Disp: 30 tablet, Rfl: 0   levothyroxine (SYNTHROID) 112 MCG tablet, Take 1 tablet (112 mcg total) by mouth daily before breakfast., Disp: 90 tablet, Rfl: 4   losartan (COZAAR) 50 MG tablet, Take 1 tablet (50 mg total) by mouth daily., Disp: 90 tablet, Rfl: 4   meloxicam (MOBIC) 15 MG tablet, Take 1 tablet (15 mg total) by mouth daily., Disp: 90 tablet, Rfl: 4   Multiple Vitamins-Minerals (CENTRUM SILVER 50+WOMEN PO), Take 1 tablet by mouth daily., Disp: , Rfl:    sertraline (ZOLOFT) 100 MG tablet, Take 0.5 tablets (50 mg total) by mouth daily., Disp: 45 tablet, Rfl: 4   Medications ordered in this encounter:  No orders of the defined types were placed in this encounter.    *If you need refills on other medications prior to your next appointment, please contact your  pharmacy*  Follow-Up: Call back or seek an in-person evaluation if the symptoms worsen or if the condition fails to improve as anticipated.  [place] [phone]  Care Instructions: Remember to hold the Lipitor while on the antiviral (Paxlovid). Take antiviral as directed. Hydrate and rest. Consider starting OTC Mucinex as directed. I recommend OTC Vitamin C 1000 mg daily and Vitamin D3 1000 units daily.    Isolation Instructions: You are to isolate at home for 5 days from onset of your symptoms. If you must be around other household members who do not have symptoms, you need to make sure that both you and the family members are masking consistently with a high-quality mask.  After day 5 of isolation, if you have had no fever within 24 hours and you are feeling better, you can end isolation but need to mask for an additional 5 days.  After day 5 if you have a fever or are having significant symptoms, please isolate for full 10 days.  If you note any worsening of symptoms despite treatment, please seek an in-person evaluation ASAP. If you note any significant shortness of breath or any chest pain, please seek ER evaluation. Please do not delay care!   COVID-19: What to Do if You Are Sick If you test positive and are an older adult or someone who is at high risk of getting very sick from COVID-19, treatment may be available. Contact a healthcare provider right away after a positive test to  determine if you are eligible, even if your symptoms are mild right now. You can also visit a Test to Treat location and, if eligible, receive a prescription from a provider. Don't delay: Treatment must be started within the first few days to be effective. If you have a fever, cough, or other symptoms, you might have COVID-19. Most people have mild illness and are able to recover at home. If you are sick: Keep track of your symptoms. If you have an emergency warning sign (including  trouble breathing), call 911. Steps to help prevent the spread of COVID-19 if you are sick If you are sick with COVID-19 or think you might have COVID-19, follow the steps below to care for yourself and to help protect other people in your home and community. Stay home except to get medical care Stay home. Most people with COVID-19 have mild illness and can recover at home without medical care. Do not leave your home, except to get medical care. Do not visit public areas and do not go to places where you are unable to wear a mask. Take care of yourself. Get rest and stay hydrated. Take over-the-counter medicines, such as acetaminophen, to help you feel better. Stay in touch with your doctor. Call before you get medical care. Be sure to get care if you have trouble breathing, or have any other emergency warning signs, or if you think it is an emergency. Avoid public transportation, ride-sharing, or taxis if possible. Get tested If you have symptoms of COVID-19, get tested. While waiting for test results, stay away from others, including staying apart from those living in your household. Get tested as soon as possible after your symptoms start. Treatments may be available for people with COVID-19 who are at risk for becoming very sick. Don't delay: Treatment must be started early to be effective--some treatments must begin within 5 days of your first symptoms. Contact your healthcare provider right away if your test result is positive to determine if you are eligible. Self-tests are one of several options for testing for the virus that causes COVID-19 and may be more convenient than laboratory-based tests and point-of-care tests. Ask your healthcare provider or your local health department if you need help interpreting your test results. You can visit your state, tribal, local, and territorial health department's website to look for the latest local information on testing sites. Separate yourself from  other people As much as possible, stay in a specific room and away from other people and pets in your home. If possible, you should use a separate bathroom. If you need to be around other people or animals in or outside of the home, wear a well-fitting mask. Tell your close contacts that they may have been exposed to COVID-19. An infected person can spread COVID-19 starting 48 hours (or 2 days) before the person has any symptoms or tests positive. By letting your close contacts know they may have been exposed to COVID-19, you are helping to protect everyone. See COVID-19 and Animals if you have questions about pets. If you are diagnosed with COVID-19, someone from the health department may call you. Answer the call to slow the spread. Monitor your symptoms Symptoms of COVID-19 include fever, cough, or other symptoms. Follow care instructions from your healthcare provider and local health department. Your local health authorities may give instructions on checking your symptoms and reporting information. When to seek emergency medical attention Look for emergency warning signs* for COVID-19. If someone is showing any  of these signs, seek emergency medical care immediately: Trouble breathing Persistent pain or pressure in the chest New confusion Inability to wake or stay awake Pale, gray, or blue-colored skin, lips, or nail beds, depending on skin tone *This list is not all possible symptoms. Please call your medical provider for any other symptoms that are severe or concerning to you. Call 911 or call ahead to your local emergency facility: Notify the operator that you are seeking care for someone who has or may have COVID-19. Call ahead before visiting your doctor Call ahead. Many medical visits for routine care are being postponed or done by phone or telemedicine. If you have a medical appointment that cannot be postponed, call your doctor's office, and tell them you have or may have COVID-19.  This will help the office protect themselves and other patients. If you are sick, wear a well-fitting mask You should wear a mask if you must be around other people or animals, including pets (even at home). Wear a mask with the best fit, protection, and comfort for you. You don't need to wear the mask if you are alone. If you can't put on a mask (because of trouble breathing, for example), cover your coughs and sneezes in some other way. Try to stay at least 6 feet away from other people. This will help protect the people around you. Masks should not be placed on young children under age 74 years, anyone who has trouble breathing, or anyone who is not able to remove the mask without help. Cover your coughs and sneezes Cover your mouth and nose with a tissue when you cough or sneeze. Throw away used tissues in a lined trash can. Immediately wash your hands with soap and water for at least 20 seconds. If soap and water are not available, clean your hands with an alcohol-based hand sanitizer that contains at least 60% alcohol. Clean your hands often Wash your hands often with soap and water for at least 20 seconds. This is especially important after blowing your nose, coughing, or sneezing; going to the bathroom; and before eating or preparing food. Use hand sanitizer if soap and water are not available. Use an alcohol-based hand sanitizer with at least 60% alcohol, covering all surfaces of your hands and rubbing them together until they feel dry. Soap and water are the best option, especially if hands are visibly dirty. Avoid touching your eyes, nose, and mouth with unwashed hands. Handwashing Tips Avoid sharing personal household items Do not share dishes, drinking glasses, cups, eating utensils, towels, or bedding with other people in your home. Wash these items thoroughly after using them with soap and water or put in the dishwasher. Clean surfaces in your home regularly Clean and disinfect  high-touch surfaces (for example, doorknobs, tables, handles, light switches, and countertops) in your "sick room" and bathroom. In shared spaces, you should clean and disinfect surfaces and items after each use by the person who is ill. If you are sick and cannot clean, a caregiver or other person should only clean and disinfect the area around you (such as your bedroom and bathroom) on an as needed basis. Your caregiver/other person should wait as long as possible (at least several hours) and wear a mask before entering, cleaning, and disinfecting shared spaces that you use. Clean and disinfect areas that may have blood, stool, or body fluids on them. Use household cleaners and disinfectants. Clean visible dirty surfaces with household cleaners containing soap or detergent. Then, use a  household disinfectant. Use a product from [employer] List N: Disinfectants for Coronavirus (YOKHT-97). Be sure to follow the instructions on the label to ensure safe and effective use of the product. Many products recommend keeping the surface wet with a disinfectant for a certain period of time (look at "contact time" on the product label). You may also need to wear personal protective equipment, such as gloves, depending on the directions on the product label. Immediately after disinfecting, wash your hands with soap and water for 20 seconds. For completed guidance on cleaning and disinfecting your home, visit Complete Disinfection Guidance. Take steps to improve ventilation at home Improve ventilation (air flow) at home to help prevent from spreading COVID-19 to other people in your household. Clear out COVID-19 virus particles in the air by opening windows, using air filters, and turning on fans in your home. Use this interactive tool to learn how to improve air flow in your home. When you can be around others after being sick with COVID-19 Deciding when you can be around others is different for different situations.  Find out when you can safely end home isolation. For any additional questions about your care, contact your healthcare provider or state or local health department. 01/18/2021 Content source: [facility] ([facility]), Division of Viral Diseases This information is not intended to replace advice given to you by your health care provider. Make sure you discuss any questions you have with your health care provider. Document Revised: 03/03/2021 Document Reviewed: 03/03/2021 Elsevier Patient Education  2022 [employer].   If you have been instructed to have an in-person evaluation today at a local Urgent Care facility, please use the link below. It will take you to a list of all of our available [facility], including address, phone number and hours of operation. Please do not delay care.  [facility]  If you or a family member do not have a primary care provider, use the link below to schedule a visit and establish care. When you choose a [facility] primary care physician or advanced practice provider, you gain a long-term partner in health. Find a Primary Care Provider  Learn more about [facility]'s in-office and virtual care options: [facility]

## 2022-11-28 NOTE — Progress Notes (Signed)
Virtual Visit Consent   Leslie Turner, you are scheduled for a virtual visit with a [facility] provider today. Just as with appointments in the office, your consent must be obtained to participate. Your consent will be active for this visit and any virtual visit you may have with one of our providers in the next 365 days. If you have a MyChart account, a copy of this consent can be sent to you electronically.  As this is a virtual visit, video technology does not allow for your provider to perform a traditional examination. This may limit your provider's ability to fully assess your condition. If your provider identifies any concerns that need to be evaluated in person or the need to arrange testing (such as labs, EKG, etc.), we will make arrangements to do so. Although advances in technology are sophisticated, we cannot ensure that it will always work on either your end or our end. If the connection with a video visit is poor, the visit may have to be switched to a telephone visit. With either a video or telephone visit, we are not always able to ensure that we have a secure connection.  By engaging in this virtual visit, you consent to the provision of healthcare and authorize for your insurance to be billed (if applicable) for the services provided during this visit. Depending on your insurance coverage, you may receive a charge related to this service.  I need to obtain your verbal consent now. Are you willing to proceed with your visit today? Leslie Turner has provided verbal consent on 11/28/2022 for a virtual visit (video or telephone). [name], [place]  Date: 11/28/2022 10:05 AM  Virtual Visit via Video Note   I, [name], connected with  Leslie Turner  ([id number], 20-Apr-1956) on 11/28/22 at 10:00 AM EST by a video-enabled telemedicine application and verified that I am speaking with the correct person using two identifiers.  Location: Patient: Virtual Visit  Location Patient: Home Provider: Virtual Visit Location Provider: Home Office   I discussed the limitations of evaluation and management by telemedicine and the availability of in person appointments. The patient expressed understanding and agreed to proceed.    History of Present Illness: Leslie Turner is a 67 y.o. who identifies as a female who was assigned female at birth, and is being seen today for COVID-19. Patient endorses symptoms starting Thursday night into Friday morning with sore throat. Saturday felt better but by Sunday symptoms recurring with sore throat, fatigue, congestion, cough, ear pressure and popping and hoarseness. Then developed loss of taste and smell. Had initially tested for COVID Friday night and was negative. Retested last night and positive. Denies chest pain or SOB. Is hydrating and eating well. Taking OTC Aleve. As far as she knows, she has not had COVID before.  HPI: HPI  Problems:  Patient Active Problem List   Diagnosis Date Noted   Proteinuria 02/08/2022   Osteopenia of neck of right femur 01/23/2022   Aortic atherosclerosis ([employer]) 11/05/2021   Left sciatic nerve pain 11/02/2021   Urinary tract infection symptoms 02/08/2021   Essential hypertension, benign 02/19/2018   Primary osteoarthritis of both knees 05/01/2016   Chronic depression 08/06/2015   Generalized anxiety disorder 08/06/2015   Hyperlipidemia 08/06/2015   Hashimoto's thyroiditis 08/06/2015   Vitamin D deficiency 08/06/2015    Allergies: No Known Allergies Medications:  Current Outpatient Medications:    benzonatate (TESSALON) 100 MG capsule, Take 1 capsule (100 mg total)  by mouth 3 (three) times daily as needed for cough., Disp: 30 capsule, Rfl: 0   nirmatrelvir/ritonavir (PAXLOVID) 20 x 150 MG & 10 x '100MG'$  TABS, Take 3 tablets by mouth 2 (two) times daily for 5 days. (Take nirmatrelvir 150 mg two tablets twice daily for 5 days and ritonavir 100 mg one tablet twice daily for 5 days)  Patient GFR is 61, Disp: 30 tablet, Rfl: 0   atorvastatin (LIPITOR) 40 MG tablet, Take 1 tablet (40 mg total) by mouth daily., Disp: 90 tablet, Rfl: 4   cholecalciferol (VITAMIN D) 1000 UNITS tablet, Take 1,000 Units by mouth daily., Disp: , Rfl:    hydrochlorothiazide (HYDRODIURIL) 25 MG tablet, Take 0.5 tablets (12.5 mg total) by mouth daily., Disp: 45 tablet, Rfl: 4   hydrOXYzine (ATARAX/VISTARIL) 25 MG tablet, TAKE 1 TABLET (25 MG TOTAL) BY MOUTH 2 (TWO) TIMES DAILY AS NEEDED., Disp: 30 tablet, Rfl: 0   levothyroxine (SYNTHROID) 112 MCG tablet, Take 1 tablet (112 mcg total) by mouth daily before breakfast., Disp: 90 tablet, Rfl: 4   losartan (COZAAR) 50 MG tablet, Take 1 tablet (50 mg total) by mouth daily., Disp: 90 tablet, Rfl: 4   meloxicam (MOBIC) 15 MG tablet, Take 1 tablet (15 mg total) by mouth daily., Disp: 90 tablet, Rfl: 4   Multiple Vitamins-Minerals (CENTRUM SILVER 50+WOMEN PO), Take 1 tablet by mouth daily., Disp: , Rfl:    sertraline (ZOLOFT) 100 MG tablet, Take 0.5 tablets (50 mg total) by mouth daily., Disp: 45 tablet, Rfl: 4  Observations/Objective: Patient is well-developed, well-nourished in no acute distress.  Resting comfortably at home.  Head is normocephalic, atraumatic.  No labored breathing. Speech is clear and coherent with logical content.  Patient is alert and oriented at baseline.   Assessment and Plan: 1. COVID-19 - benzonatate (TESSALON) 100 MG capsule; Take 1 capsule (100 mg total) by mouth 3 (three) times daily as needed for cough.  Dispense: 30 capsule; Refill: 0 - nirmatrelvir/ritonavir (PAXLOVID) 20 x 150 MG & 10 x '100MG'$  TABS; Take 3 tablets by mouth 2 (two) times daily for 5 days. (Take nirmatrelvir 150 mg two tablets twice daily for 5 days and ritonavir 100 mg one tablet twice daily for 5 days) Patient GFR is 61  Dispense: 30 tablet; Refill: 0  Patient with multiple risk factors for complicated course of illness. Discussed risks/benefits of antiviral  medications including most common potential ADRs. Patient voiced understanding and would like to proceed with antiviral medication. They are candidate for Paxlovid. She is to hold her Lipitor while on antiviral. Rx sent to pharmacy. Supportive measures, OTC medications and vitamin regimen reviewed. Tessalon per orders.  Quarantine reviewed in detail. Strict ER precautions discussed with patient.    Follow Up Instructions: I discussed the assessment and treatment plan with the patient. The patient was provided an opportunity to ask questions and all were answered. The patient agreed with the plan and demonstrated an understanding of the instructions.  A copy of instructions were sent to the patient via MyChart unless otherwise noted below.    The patient was advised to call back or seek an in-person evaluation if the symptoms worsen or if the condition fails to improve as anticipated.  Time:  I spent 10 minutes with the patient via telehealth technology discussing the above problems/concerns.    [name], PA-C

## 2022-11-28 NOTE — Telephone Encounter (Signed)
Pt scheduled 1/30 for telehealth visit

## 2022-11-28 NOTE — Telephone Encounter (Signed)
  Chief Complaint: COVID+ Symptoms: Hoarse, cough, HA, Congestion Frequency: Since Thursday night Pertinent Negatives: Patient denies SOB Disposition: '[]'$ ED /'[]'$ Urgent Care (no appt availability in office) / '[]'$ Appointment(In office/virtual)/ '[x]'$  [facility]/ '[]'$ Home Care/ '[]'$ Refused Recommended Disposition /'[]'$ [facility]/ '[]'$  Follow-up with PCP Additional Notes: Pt states that this started Thursday evening. Pt tested + yesterday. Pt tested on Friday - negative.  PT states that she can't lay down d/t cough. PT states she feels worse today than yesterday.   Summary: headache, congestion, hoarseness, cough, cold hands and feet   Patient called in  with covid, has mild headache, congestion, hoarseness, cough, cold hands and feet, she was told to set up virtual but no appt available with Jolene until Friday. She wants to know what she can do, can't wait till Friday.       Reason for Disposition  [1] HIGH RISK patient (e.g., weak immune system, age > 67 years, obesity with BMI 30 or higher, pregnant, chronic lung disease or other chronic medical condition) AND [2] COVID symptoms (e.g., cough, fever)  (Exceptions: Already seen by PCP and no new or worsening symptoms.)  Answer Assessment - Initial Assessment Questions 1. COVID-19 DIAGNOSIS: "How do you know that you have COVID?" (e.g., positive lab test or self-test, diagnosed by doctor or NP/PA, symptoms after exposure).     Test yesterday 2. COVID-19 EXPOSURE: "Was there any known exposure to COVID before the symptoms began?" CDC Definition of close contact: within 6 feet (2 meters) for a total of 15 minutes or more over a 24-hour period.      no 3. ONSET: "When did the COVID-19 symptoms start?"      Thursday night sore throat -Friday 4. WORST SYMPTOM: "What is your worst symptom?" (e.g., cough, fever, shortness of breath, muscle aches)     HA 5. COUGH: "Do you have a cough?" If Yes, ask: "How bad is the cough?"       Cough -  Can't lay down 6. FEVER: "Do you have a fever?" If Yes, ask: "What is your temperature, how was it measured, and when did it start?"     No - feels like she may have one at times 7. RESPIRATORY STATUS: "Describe your breathing?" (e.g., normal; shortness of breath, wheezing, unable to speak)      NO 8. BETTER-SAME-WORSE: "Are you getting better, staying the same or getting worse compared to yesterday?"  If getting worse, ask, "In what way?"     worse 9. OTHER SYMPTOMS: "Do you have any other symptoms?"  (e.g., chills, fatigue, headache, loss of smell or taste, muscle pain, sore throat)     HA, fatigue, Congestion, cough 10. HIGH RISK DISEASE: "Do you have any chronic medical problems?" (e.g., asthma, heart or lung disease, weak immune system, obesity, etc.)       no 11. VACCINE: "Have you had the COVID-19 vaccine?" If Yes, ask: "Which one, how many shots, when did you get it?"       Had them all  Protocols used: Coronavirus (COVID-19) Diagnosed or Suspected-A-AH

## 2022-12-19 ENCOUNTER — Other Ambulatory Visit: Payer: Self-pay | Admitting: Nurse Practitioner

## 2022-12-20 NOTE — Telephone Encounter (Signed)
Rx 02/08/22 #45 4RF- too soon Requested Prescriptions  Pending Prescriptions Disp Refills   sertraline (ZOLOFT) 100 MG tablet [Pharmacy Med Name: SERTRALINE TAB 100MG] 45 tablet 3    Sig: TAKE 1/2 TABLET(50 MG      TOTAL) DAILY     Psychiatry:  Antidepressants - SSRI - sertraline Passed - 12/19/2022  5:17 AM      Passed - AST in normal range and within 360 days    AST  Date Value Ref Range Status  02/08/2022 23 0 - 40 IU/L Final         Passed - ALT in normal range and within 360 days    ALT  Date Value Ref Range Status  02/08/2022 16 0 - 32 IU/L Final         Passed - Completed PHQ-2 or PHQ-9 in the last 360 days      Passed - Valid encounter within last 6 months    Recent Outpatient Visits           2 months ago Primary osteoarthritis of both [place], [place] T, NP   4 months ago Chronic depression   [place], [place] T, NP   6 months ago Primary osteoarthritis of both [place], [place] T, NP   10 months ago Aortic atherosclerosis ([place])   [place], Henrine Screws T, NP   1 year ago Left sciatic nerve pain   [place], Barbaraann Faster, NP       Future Appointments             In 1 month [place], Barbaraann Faster, NP [place], [facility]

## 2022-12-21 ENCOUNTER — Encounter: Payer: Self-pay | Admitting: Nurse Practitioner

## 2022-12-22 MED ORDER — SERTRALINE HCL 100 MG PO TABS: 50.0000 mg | ORAL_TABLET | Freq: Every day | ORAL | 4 refills | Status: DC

## 2022-12-22 NOTE — Addendum Note (Signed)
Addended by: Venita Lick on: 12/22/2022 08:12 AM   Modules accepted: Orders

## 2023-02-12 ENCOUNTER — Encounter: Payer: Medicare Other | Admitting: Nurse Practitioner

## 2023-02-13 ENCOUNTER — Other Ambulatory Visit: Payer: Self-pay | Admitting: Nurse Practitioner

## 2023-02-13 DIAGNOSIS — I10 Essential (primary) hypertension: Secondary | ICD-10-CM

## 2023-02-13 NOTE — Telephone Encounter (Signed)
Requested Prescriptions  Pending Prescriptions Disp Refills   hydrochlorothiazide (HYDRODIURIL) 25 MG tablet [Pharmacy Med Name: HYDROCHLOROT TAB ] 45 tablet 3    Sig: TAKE 1/2 TABLET(12.5MG      TOTAL) DAILY     Cardiovascular: Diuretics - Thiazide Failed - 02/13/2023  2:05 AM      Failed - Cr in normal range and within 180 days    Creatinine, Ser  Date Value Ref Range Status  08/10/2022 1.02 (H) 0.57 - 1.00 mg/dL Final         Failed - K in normal range and within 180 days    Potassium  Date Value Ref Range Status  08/10/2022 4.0 3.5 - 5.2 mmol/L Final         Failed - Na in normal range and within 180 days    Sodium  Date Value Ref Range Status  08/10/2022 141 134 - 144 mmol/L Final         Passed - Last BP in normal range    BP Readings from Last 1 Encounters:  10/03/22 126/82         Passed - Valid encounter within last 6 months    Recent Outpatient Visits           4 months ago Primary osteoarthritis of both knees   [place] [facility] [place], [place] T, NP   6 months ago Chronic depression   [place] [facility] [place], La[place] T, NP   8 months ago Primary osteoarthritis of both knees   [place] [facility] [place], Brea T, NP   1 year ago Aortic atherosclerosis (HCC)   [place] [facility] [place], Corrie Dandy T, NP   1 year ago Left sciatic nerve pain   [place] [facility] [place], Dorie Rank, NP

## 2023-02-17 NOTE — Patient Instructions (Signed)
Be Involved in Your Health Care:  Taking Medications When medications are taken as directed, they can greatly improve your health. But if they are not taken as instructed, they may not work. In some cases, not taking them correctly can be harmful. To help ensure your treatment remains effective and safe, understand your medications and how to take them.  Your lab results, notes and after visit summary will be available on My Chart. We strongly encourage you to use this feature. If lab results are abnormal the clinic will contact you with the appropriate steps. If the clinic does not contact you assume the results are satisfactory. You can always see your results on My Chart. If you have questions regarding your condition, please contact the clinic during office hours. You can also ask questions on My Chart.  We at [facility] are grateful that you chose [place] to provide care. We strive to provide excellent and compassionate care and are always looking for feedback. If you get a survey from the clinic please complete this.    Major Depressive Disorder, Adult Major depressive disorder (MDD) is a mental health condition. People with this disorder feel very sad, hopeless, and lose interest in things. Symptoms last most of the day, almost every day, for 2 weeks. MDD can affect: Relationships. Work and school. Things you usually like to do. What are the causes? The cause of MDD is not known. What increases the risk? Having family members with depression. Being female. Family problems. Alcohol or drug misuse. A lot of stress in your life, such as from: Living without basic needs such as food and housing. Being treated poorly because of race, sex, or religion (discrimination). Things that caused you pain as a child, especially if you lost a parent or were abused. Health and mental problems that you have had for a long time. What are the signs or symptoms? The main symptoms of this  condition are: Being sad all the time. Being grouchy (irritable) all the time. Not enjoying the things you usually like. Sleeping too much or too little. Eating too much or too little. Feeling tired. Other symptoms include: Gaining or losing weight, without knowing why. Being restless and weak. Feeling hopeless, worthless, or guilty. Trouble thinking or making decisions. Thoughts of hurting yourself or others, or thoughts of ending your life. Spending a lot of time alone. Being unable to do daily tasks. If you have very bad MDD, you may: Believe things that are not true. Hear, see, taste, or feel things that are not there. Have mild depression that lasts for at least 2 years. Feel very sad and hopeless. Have trouble speaking or moving. Feel very sad during some seasons. How is this treated? Talk therapy. This teaches you about thoughts, feelings, and actions and how to change them. This can also help you to talk with others. This can be done with members of your family. Medicines. Lifestyle changes. You may need to: Limit alcohol use. Stop using drugs, if you use them. Exercise. Get plenty of sleep. Eat healthy. Spend more time outdoors. Brain stimulation. This may be done when symptoms are very bad or have not gotten better. Follow these instructions at home: Alcohol use Do not drink alcohol if: Your health care provider tells you not to drink. You are pregnant, may be pregnant, or are planning to become pregnant. If you drink alcohol: Limit how much you use to: 0-1 drink a day for women. 0-2 drinks a day for men.  Know how much alcohol is in your drink. In the [place] one drink equals one 12 oz bottle of beer (355 mL), one 5 oz glass of wine (148 mL), or one 1 oz glass of hard liquor (44 mL). Activity Exercise as told by your doctor. Spend time outdoors. Make time to do the things you enjoy. Find ways to deal with stress. Try to: Meditate. Do deep breathing. Spend  time in nature. Keep a journal. Return to your normal activities when your doctor says that it is safe. General instructions  Take over-the-counter and prescription medicines only as told by your doctor. Talk to your doctor about: Alcohol use. It can affect medicines. Any drug use. Eat healthy foods. Get a lot of sleep. Think about joining a support group. Ask your doctor about that. Keep all follow-up visits. Your doctor will need to check on your mood, behavior, and medicines, and change your treatment as needed. Where to find more information: [employer] on Mental Illness: [employer]ami[employer] of Mental Health: BloggerCourse.com American Psychiatric Association: psychiatry.org Contact a doctor if: You feel worse. You get new symptoms. Get help right away if: You hurt yourself on purpose (self-harm). You have thoughts about hurting yourself or others. You see, hear, taste, smell, or feel things that are not there. Get help right away if you feel like you may hurt yourself or others, or have thoughts about taking your own life. Go to your nearest emergency room or: Call 911. Call the National Suicide Prevention Lifeline at 7805578622 or 988. This is open 24 hours a day. Text the Crisis Text Line at 512-708-9993. This information is not intended to replace advice given to you by your health care provider. Make sure you discuss any questions you have with your health care provider. Document Revised: 02/21/2022 Document Reviewed: 02/21/2022 Elsevier Patient Education  2023 [employer].

## 2023-02-18 NOTE — Discharge Instructions (Signed)
Instructions after Total Knee Replacement   Leslie Turner, Jr., M.D.     Dept. of Orthopaedics & Sports Medicine  [facility]  [address]  [place], [place]  [address]  Phone: [phone]   Fax: [phone]    DIET: Drink plenty of non-alcoholic fluids. Resume your normal diet. Include foods high in fiber.  ACTIVITY:  You may use crutches or a walker with weight-bearing as tolerated, unless instructed otherwise. You may be weaned off of the walker or crutches by your Physical Therapist.  Do NOT place pillows under the knee. Anything placed under the knee could limit your ability to straighten the knee.   Continue doing gentle exercises. Exercising will reduce the pain and swelling, increase motion, and prevent muscle weakness.   Please continue to use the TED compression stockings for 6 weeks. You may remove the stockings at night, but should reapply them in the morning. Do not drive or operate any equipment until instructed.  WOUND CARE:  Continue to use the PolarCare or ice packs periodically to reduce pain and swelling. You may bathe or shower after the staples are removed at the first office visit following surgery.  MEDICATIONS: You may resume your regular medications. Please take the pain medication as prescribed on the medication. Do not take pain medication on an empty stomach. You have been given a prescription for a blood thinner (Lovenox or Coumadin). Please take the medication as instructed. (NOTE: After completing a 2 week course of Lovenox, take one Enteric-coated aspirin once a day. This along with elevation will help reduce the possibility of phlebitis in your operated leg.) Do not drive or drink alcoholic beverages when taking pain medications.  CALL THE OFFICE FOR: Temperature above 101 degrees Excessive bleeding or drainage on the dressing. Excessive swelling, coldness, or paleness of the toes. Persistent nausea and vomiting.  FOLLOW-UP:  You  should have an appointment to return to the office in 10-14 days after surgery. Arrangements have been made for continuation of Physical Therapy (either home therapy or outpatient therapy).   [facility] Department Directory         [email or web address]       [email or web address]          Cardiology  Appointments: [place] - [phone] [place] - [phone]  Endocrinology  Appointments: [place] - [phone] [place] - [phone]  Gastroenterology  Appointments: [place] - [phone] [place] - [phone]        General Surgery   Appointments: [place] - [phone]  Internal Medicine/Family Medicine  Appointments: [place] - [phone] [place] - [phone] [place] - [phone]  Metabolic and Weigh Loss Surgery  Appointments: [place] - [phone]        Neurology  Appointments: [place] - [phone] [place] - [phone]  Neurosurgery  Appointments: [place] - [phone]  Obstetrics & Gynecology  Appointments: [place] - [phone] [place] - [phone]        Pediatrics  Appointments: [place] - [phone] [place] - [phone]  Physiatry  Appointments: [place] -[phone]  Physical Therapy  Appointments: [place] - [phone] [place] - [phone]        Podiatry  Appointments: [place] - [phone] [place] - [phone]  Pulmonology  Appointments: [place] - [phone]  Rheumatology  Appointments: [place] - [phone]        [place] Location: [facility]  [address] [place], [place]  [address]  [place] Location: [facility] [address] [place], [place]  [address]  [place] Location: [facility] [address] [place], [place]  [address]    

## 2023-02-19 ENCOUNTER — Other Ambulatory Visit: Payer: Self-pay | Admitting: Nurse Practitioner

## 2023-02-19 ENCOUNTER — Encounter: Payer: Self-pay | Admitting: Nurse Practitioner

## 2023-02-19 ENCOUNTER — Ambulatory Visit (INDEPENDENT_AMBULATORY_CARE_PROVIDER_SITE_OTHER): Payer: Medicare Other | Admitting: Nurse Practitioner

## 2023-02-19 VITALS — BP 130/70 | HR 63 | Temp 98.1°F | Ht 64.49 in | Wt 201.0 lb

## 2023-02-19 DIAGNOSIS — I700 Atherosclerosis of aorta: Secondary | ICD-10-CM | POA: Diagnosis not present

## 2023-02-19 DIAGNOSIS — Z23 Encounter for immunization: Secondary | ICD-10-CM | POA: Diagnosis not present

## 2023-02-19 DIAGNOSIS — Z0000 Encounter for general adult medical examination without abnormal findings: Secondary | ICD-10-CM | POA: Diagnosis not present

## 2023-02-19 DIAGNOSIS — F411 Generalized anxiety disorder: Secondary | ICD-10-CM

## 2023-02-19 DIAGNOSIS — E559 Vitamin D deficiency, unspecified: Secondary | ICD-10-CM

## 2023-02-19 DIAGNOSIS — F32A Depression, unspecified: Secondary | ICD-10-CM

## 2023-02-19 DIAGNOSIS — R801 Persistent proteinuria, unspecified: Secondary | ICD-10-CM

## 2023-02-19 DIAGNOSIS — I10 Essential (primary) hypertension: Secondary | ICD-10-CM | POA: Diagnosis not present

## 2023-02-19 DIAGNOSIS — Z1231 Encounter for screening mammogram for malignant neoplasm of breast: Secondary | ICD-10-CM

## 2023-02-19 DIAGNOSIS — E7800 Pure hypercholesterolemia, unspecified: Secondary | ICD-10-CM | POA: Diagnosis not present

## 2023-02-19 DIAGNOSIS — M170 Bilateral primary osteoarthritis of knee: Secondary | ICD-10-CM

## 2023-02-19 DIAGNOSIS — E063 Autoimmune thyroiditis: Secondary | ICD-10-CM

## 2023-02-19 DIAGNOSIS — M85851 Other specified disorders of bone density and structure, right thigh: Secondary | ICD-10-CM

## 2023-02-19 LAB — MICROALBUMIN, URINE WAIVED
Creatinine, Urine Waived: 200 mg/dL (ref 10–300)
Microalb, Ur Waived: 80 mg/L — ABNORMAL HIGH (ref 0–19)

## 2023-02-19 MED ORDER — ATORVASTATIN CALCIUM 40 MG PO TABS: 40.0000 mg | ORAL_TABLET | Freq: Every day | ORAL | 4 refills | Status: DC

## 2023-02-19 MED ORDER — HYDROCHLOROTHIAZIDE 25 MG PO TABS: 12.5000 mg | ORAL_TABLET | Freq: Every day | ORAL | 4 refills | Status: DC

## 2023-02-19 MED ORDER — LOSARTAN POTASSIUM 50 MG PO TABS: 50.0000 mg | ORAL_TABLET | Freq: Every day | ORAL | 4 refills | Status: DC

## 2023-02-19 MED ORDER — SERTRALINE HCL 100 MG PO TABS: 50.0000 mg | ORAL_TABLET | Freq: Every day | ORAL | 4 refills | Status: DC

## 2023-02-19 MED ORDER — LEVOTHYROXINE SODIUM 112 MCG PO TABS: 112.0000 ug | ORAL_TABLET | Freq: Every day | ORAL | 4 refills | Status: DC

## 2023-02-19 NOTE — Assessment & Plan Note (Signed)
Chronic, stable.  BP well below goal.  Continue current medication regimen and adjust as needed. Recommend checking BP at home three days a week + focus on DASH diet, could consider reduction of medication in future.  LABS: CBC, CMP, TSH, Urine ALB.  Urine ALB 80 April 2024, continue Losartan for kidney protection.  Return in 6 months.

## 2023-02-19 NOTE — Assessment & Plan Note (Signed)
Chronic, stable.  Continue Sertraline 50 MG and Vistaril as needed (using very minimally).  Denies SI/HI.  Return to office in 6 months. 

## 2023-02-19 NOTE — Assessment & Plan Note (Addendum)
Ongoing.  Diagnosed on 01/23/22.  Recommend daily Vitamin D 2000 units, which she has started, and adequate calcium intake.  Weight bearing exercises recommended.  Vit D level today.  Repeat DEXA around 01/24/2027.

## 2023-02-19 NOTE — Addendum Note (Signed)
Addended by: Marjie Skiff on: 02/19/2023 03:42 PM   Modules accepted: Orders

## 2023-02-19 NOTE — Assessment & Plan Note (Signed)
Chronic, stable.  Continue current medication regimen and adjust as needed.  Obtain CMP and lipid panel today. 

## 2023-02-19 NOTE — Assessment & Plan Note (Signed)
Refer to [facility] plan of care for further. 

## 2023-02-19 NOTE — Assessment & Plan Note (Signed)
Chronic, stable.  Denies SI/HI.  Continue current medication regimen and adjust as needed, continue to reduce as tolerated.  Check LFTs today.   

## 2023-02-19 NOTE — Assessment & Plan Note (Signed)
Chronic, ongoing.  Continue daily supplement and check Vit D level today. ?

## 2023-02-19 NOTE — Progress Notes (Signed)
BP 130/70   Pulse 63   Temp 98.1 F (36.7 C) (Oral)   Ht 5' 4.49" (1.638 m)   Wt 201 lb (91.2 kg)   LMP  (LMP Unknown)   SpO2 97%   BMI 33.98 kg/m    Subjective:    Patient ID: Leslie Turner, female    DOB: 1956/06/11, 67 y.o.   MRN: [id number]  HPI: Leslie Turner is a 67 y.o. female presenting on 02/19/2023 for comprehensive medical examination. Current medical complaints include: none  She currently lives with: husband Menopausal Symptoms: no  HYPERTENSION / HYPERLIPIDEMIA Continues on Losartan, HCTZ, and Atorvastatin. Satisfied with current treatment? yes Duration of hypertension: chronic BP monitoring frequency: not checking BP range:  BP medication side effects: no Past BP meds: HCTZ and losartan (cozaar) Duration of hyperlipidemia: chronic Cholesterol medication side effects: no Cholesterol supplements: none Past cholesterol medications: atorvastain (lipitor) Medication compliance: good compliance Aspirin: no Recent stressors: no Recurrent headaches: no Visual changes: no Palpitations: no Dyspnea: no Chest pain: no Lower extremity edema: no Dizzy/lightheaded: no  The 10-year ASCVD risk score (Arnett DK, et al., 2019) is: 7.5%   Values used to calculate the score:     Age: 70 years     Sex: Female     Is Non-Hispanic African American: No     Diabetic: No     Tobacco smoker: No     Systolic Blood Pressure: 130 mmHg     Is BP treated: Yes     HDL Cholesterol: 61 mg/dL     Total Cholesterol: 167 mg/dL  OSTEOPENIA DEXA 02/06/80, The BMD measured at Femur Total Right is 0.813 g/cm2 with a T-score of -1.5. Taking supplements.  Is scheduled for knee replacement left on 03/02/23, plans on performing right 3 months later = Dr. Ernest Pine. Satisfied with current treatment?: yes Adequate calcium & vitamin D: yes Weight bearing exercises: yes   HYPOTHYROIDISM Continues on Levothyroxine 112 MCG.  Thyroid control status:controlled Satisfied with current  treatment? yes Medication side effects: no Medication compliance: good compliance Etiology of hypothyroidism:  Recent dose adjustment:no Fatigue: no Cold intolerance: no Heat intolerance: no Weight gain: no Weight loss: no Constipation: no Diarrhea/loose stools: no Palpitations: no Lower extremity edema: no Anxiety/depressed mood: no    ANXIETY/DEPRESSION Taking 50 MG of Zoloft at this time. Controlled levels Anxious mood: no  Excessive worrying: no Irritability: no  Sweating: no Nausea: no Palpitations:no Hyperventilation: no Panic attacks: no Agoraphobia: no  Obscessions/compulsions: no Depressed mood: no    02/19/2023   10:33 AM 10/03/2022    4:00 PM 08/31/2022   11:48 AM 08/10/2022    9:34 AM 05/30/2022   11:32 AM  Depression screen PHQ 2/9  Decreased Interest 0 0 0 0 0  Down, Depressed, Hopeless 0 0 0 0 0  PHQ - 2 Score 0 0 0 0 0  Altered sleeping 0 0 0 0 1  Tired, decreased energy 0 0 0 0 0  Change in appetite 0 0 0 0 0  Feeling bad or failure about yourself  0 0 0 0 0  Trouble concentrating 0 0 0 0 0  Moving slowly or fidgety/restless 0 0 0 0 0  Suicidal thoughts 0 0 0 0 0  PHQ-9 Score 0 0 0 0 1  Difficult doing work/chores Not difficult at all Not difficult at all Not difficult at all Not difficult at all Not difficult at all        02/19/2023   10:33  AM 10/03/2022    4:00 PM 08/10/2022    9:35 AM 05/30/2022   11:32 AM  GAD 7 : Generalized Anxiety Score  Nervous, Anxious, on Edge 0 0 0 0  Control/stop worrying 0 0 0 0  Worry too much - different things 0 0 0 0  Trouble relaxing 0 0 0 0  Restless 0 0 0 0  Easily annoyed or irritable 0 0 0 0  Afraid - awful might happen 0 0 0 0  Total GAD 7 Score 0 0 0 0  Anxiety Difficulty Not difficult at all Not difficult at all Not difficult at all       08/10/2022    9:34 AM 08/31/2022   11:42 AM 10/03/2022    3:59 PM 02/18/2023    1:56 PM 02/19/2023   10:34 AM  Fall Risk  Falls in the past year? 0 0 0 0 0   Was there an injury with Fall? 0 0 0  0  Fall Risk Category Calculator 0 0 0  0  Fall Risk Category (Retired) Low Low Low    (RETIRED) Patient Fall Risk Level Low fall risk Low fall risk     Patient at Risk for Falls Due to No Fall Risks  No Fall Risks  No Fall Risks  Fall risk Follow up Falls evaluation completed Falls evaluation completed;Education provided;Falls prevention discussed Falls evaluation completed  Education provided    Functional Status Survey: Is the patient deaf or have difficulty hearing?: No Does the patient have difficulty seeing, even when wearing glasses/contacts?: No Does the patient have difficulty concentrating, remembering, or making decisions?: No Does the patient have difficulty walking or climbing stairs?: No Does the patient have difficulty dressing or bathing?: No Does the patient have difficulty doing errands alone such as visiting a doctor's office or shopping?: No    Past Medical History:  Past Medical History:  Diagnosis Date   Anxiety    Carotid stenosis    Depression    GERD (gastroesophageal reflux disease)    Hyperlipidemia    Hypertension    Metabolic syndrome    Microscopic hematuria    Pain in knee    Thyroid disease    hypothyroid   Vitamin D deficiency     Surgical History:  Past Surgical History:  Procedure Laterality Date   APPENDECTOMY     ENDOMETRIAL BIOPSY  06/15/2009   disordered prolif. phase   FOOT NEUROMA SURGERY Left     Medications:  Current Outpatient Medications on File Prior to Visit  Medication Sig   atorvastatin (LIPITOR) 40 MG tablet Take 1 tablet (40 mg total) by mouth daily.   cholecalciferol (VITAMIN D) 1000 UNITS tablet Take 1,000 Units by mouth daily.   hydrochlorothiazide (HYDRODIURIL) 25 MG tablet Take 0.5 tablets (12.5 mg total) by mouth daily.   hydrOXYzine (ATARAX/VISTARIL) 25 MG tablet TAKE 1 TABLET (25 MG TOTAL) BY MOUTH 2 (TWO) TIMES DAILY AS NEEDED.   levothyroxine (SYNTHROID) 112 MCG tablet  Take 1 tablet (112 mcg total) by mouth daily before breakfast.   losartan (COZAAR) 50 MG tablet Take 1 tablet (50 mg total) by mouth daily.   meloxicam (MOBIC) 15 MG tablet Take 1 tablet (15 mg total) by mouth daily.   Multiple Vitamins-Minerals (CENTRUM SILVER 50+WOMEN PO) Take 1 tablet by mouth daily.   sertraline (ZOLOFT) 100 MG tablet Take 0.5 tablets (50 mg total) by mouth daily.   No current facility-administered medications on file prior to visit.  Allergies:  No Known Allergies  Social History:  Social History   Socioeconomic History   Marital status: Married    Spouse name: Merchant navy officer   Number of children: 2   Years of education: Not on file   Highest education level: Not on file  Occupational History   Not on file  Tobacco Use   Smoking status: Former   Smokeless tobacco: Never   Tobacco comments:    smoked for 12-14 years and quit cold [place]  Vaping Use   Vaping Use: Never used  Substance and Sexual Activity   Alcohol use: No    Alcohol/week: 0.0 standard drinks of alcohol    Comment: occ   Drug use: No   Sexual activity: Not Currently    Partners: Male  Other Topics Concern   Not on file  Social History Narrative   Not on file   Social Determinants of Health   Financial Resource Strain: Low Risk  (08/31/2022)   Overall Financial Resource Strain (CARDIA)    Difficulty of Paying Living Expenses: Not hard at all  Food Insecurity: No Food Insecurity (08/31/2022)   Hunger Vital Sign    Worried About Running Out of Food in the Last Year: Never true    Ran Out of Food in the Last Year: Never true  Transportation Needs: No Transportation Needs (08/31/2022)   PRAPARE - Administrator, Civil Service (Medical): No    Lack of Transportation (Non-Medical): No  Physical Activity: Insufficiently Active (08/31/2022)   Exercise Vital Sign    Days of Exercise per Week: 2 days    Minutes of Exercise per Session: 30 min  Stress: No Stress Concern Present  (08/31/2022)   [employer] of Occupational Health - Occupational Stress Questionnaire    Feeling of Stress : Not at all  Social Connections: Moderately Integrated (08/31/2022)   Social Connection and Isolation Panel [NHANES]    Frequency of Communication with Friends and Family: More than three times a week    Frequency of Social Gatherings with Friends and Family: Twice a week    Attends Religious Services: More than 4 times per year    Active Member of [employer] or Organizations: No    Attends Banker Meetings: Never    Marital Status: Married  Catering manager Violence: Not At Risk (08/31/2022)   Humiliation, Afraid, Rape, and Kick questionnaire    Fear of Current or Ex-Partner: No    Emotionally Abused: No    Physically Abused: No    Sexually Abused: No   Social History   Tobacco Use  Smoking Status Former  Smokeless Tobacco Never  Tobacco Comments   smoked for 12-14 years and quit cold [place]   Social History   Substance and Sexual Activity  Alcohol Use No   Alcohol/week: 0.0 standard drinks of alcohol   Comment: occ    Family History:  Family History  Problem Relation Age of Onset   Arthritis Mother    Cancer Mother 63       colon   Diabetes Father    Heart disease Father    Hypertension Father    Breast cancer Paternal Aunt    Heart disease Paternal Grandfather        MI   Heart disease Brother    Lung cancer Brother     Past medical history, surgical history, medications, allergies, family history and social history reviewed with patient today and changes made to appropriate areas of the chart.  ROS All other ROS negative except what is listed above and in the HPI.      Objective:    BP 130/70   Pulse 63   Temp 98.1 F (36.7 C) (Oral)   Ht 5' 4.49" (1.638 m)   Wt 201 lb (91.2 kg)   LMP  (LMP Unknown)   SpO2 97%   BMI 33.98 kg/m   Wt Readings from Last 3 Encounters:  02/19/23 201 lb (91.2 kg)  10/03/22 195 lb 14.4 oz (88.9 kg)   08/10/22 192 lb 6.4 oz (87.3 kg)    Physical Exam Vitals and nursing note reviewed. Exam conducted with a chaperone present.  Constitutional:      General: She is awake. She is not in acute distress.    Appearance: She is well-developed and well-groomed. She is obese. She is not ill-appearing or toxic-appearing.  HENT:     Head: Normocephalic and atraumatic.     Right Ear: Hearing, tympanic membrane, ear canal and external ear normal. No drainage.     Left Ear: Hearing, tympanic membrane, ear canal and external ear normal. No drainage.     Nose: Nose normal.     Right Sinus: No maxillary sinus tenderness or frontal sinus tenderness.     Left Sinus: No maxillary sinus tenderness or frontal sinus tenderness.     Mouth/Throat:     Mouth: Mucous membranes are moist.     Pharynx: Oropharynx is clear. Uvula midline. No pharyngeal swelling, oropharyngeal exudate or posterior oropharyngeal erythema.  Eyes:     General: Lids are normal.        Right eye: No discharge.        Left eye: No discharge.     Extraocular Movements: Extraocular movements intact.     Conjunctiva/sclera: Conjunctivae normal.     Pupils: Pupils are equal, round, and reactive to light.     Visual Fields: Right eye visual fields normal and left eye visual fields normal.  Neck:     Thyroid: No thyromegaly.     Vascular: No carotid bruit.     Trachea: Trachea normal.  Cardiovascular:     Rate and Rhythm: Normal rate and regular rhythm.     Heart sounds: Normal heart sounds. No murmur heard.    No gallop.  Pulmonary:     Effort: Pulmonary effort is normal. No accessory muscle usage or respiratory distress.     Breath sounds: Normal breath sounds.  Chest:  Breasts:    Right: Normal.     Left: Normal.  Abdominal:     General: Bowel sounds are normal.     Palpations: Abdomen is soft. There is no hepatomegaly or splenomegaly.     Tenderness: There is no abdominal tenderness.  Musculoskeletal:        General: Normal  range of motion.     Cervical back: Normal range of motion and neck supple.     Right lower leg: No edema.     Left lower leg: No edema.  Lymphadenopathy:     Head:     Right side of head: No submental, submandibular, tonsillar, preauricular or posterior auricular adenopathy.     Left side of head: No submental, submandibular, tonsillar, preauricular or posterior auricular adenopathy.     Cervical: No cervical adenopathy.     Upper Body:     Right upper body: No supraclavicular, axillary or pectoral adenopathy.     Left upper body: No supraclavicular, axillary or pectoral adenopathy.  Skin:  General: Skin is warm and dry.     Capillary Refill: Capillary refill takes less than 2 seconds.     Findings: No rash.  Neurological:     Mental Status: She is alert and oriented to person, place, and time.     Gait: Gait is intact.     Deep Tendon Reflexes: Reflexes are normal and symmetric.     Reflex Scores:      Brachioradialis reflexes are 2+ on the right side and 2+ on the left side.      Patellar reflexes are 2+ on the right side and 2+ on the left side. Psychiatric:        Attention and Perception: Attention normal.        Mood and Affect: Mood normal.        Speech: Speech normal.        Behavior: Behavior normal. Behavior is cooperative.        Thought Content: Thought content normal.        Judgment: Judgment normal.    Results for orders placed or performed in visit on 02/19/23  Microalbumin, Urine Waived  Result Value Ref Range   Microalb, Ur Waived 80 (H) 0 - 19 mg/L   Creatinine, Urine Waived 200 10 - 300 mg/dL   Microalb/Creat Ratio 30-300 (H) <30 mg/g      Assessment & Plan:   Problem List Items Addressed This Visit       Cardiovascular and Mediastinum   Aortic atherosclerosis    Noted on lumbar imaging 11/04/21 -- continue statin daily and recommend ASA daily for prevention.      Relevant Orders   Comprehensive metabolic panel   Lipid Panel w/o Chol/HDL Ratio    Essential hypertension, benign    Chronic, stable.  BP well below goal.  Continue current medication regimen and adjust as needed. Recommend checking BP at home three days a week + focus on DASH diet, could consider reduction of medication in future.  LABS: CBC, CMP, TSH, Urine ALB.  Urine ALB 80 April 2024, continue Losartan for kidney protection.  Return in 6 months.      Relevant Orders   Microalbumin, Urine Waived (Completed)   CBC with Differential/Platelet   Comprehensive metabolic panel     Endocrine   Hashimoto's thyroiditis    Chronic, stable.  Continue current medication regimen and adjust as needed.  Labs obtained today.      Relevant Orders   TSH   T4, free     Musculoskeletal and Integument   Osteopenia of neck of right femur    Ongoing.  Diagnosed on 01/23/22.  Recommend daily Vitamin D 2000 units, which she has started, and adequate calcium intake.  Weight bearing exercises recommended.  Vit D level today.  Repeat DEXA around 01/24/2027.      Relevant Orders   VITAMIN D 25 Hydroxy (Vit-D Deficiency, Fractures)   Primary osteoarthritis of both knees    Scheduled for knee replacement left side upcoming.        Other   Chronic depression    Chronic, stable.  Denies SI/HI.  Continue current medication regimen and adjust as needed, continue to reduce as tolerated.  Check LFTs today.        Generalized anxiety disorder    Chronic, stable.  Continue Sertraline 50 MG and Vistaril as needed (using very minimally).  Denies SI/HI.  Return to office in 6 months.      Hyperlipidemia    Chronic, stable.  Continue current medication regimen and adjust as needed.  Obtain CMP and lipid panel today.      Relevant Orders   Comprehensive metabolic panel   Lipid Panel w/o Chol/HDL Ratio   Proteinuria    Refer to HTN plan of care for further.      Relevant Orders   Microalbumin, Urine Waived (Completed)   Comprehensive metabolic panel   Vitamin D deficiency    Chronic,  ongoing.  Continue daily supplement and check Vit D level today.      Relevant Orders   VITAMIN D 25 Hydroxy (Vit-D Deficiency, Fractures)   Other Visit Diagnoses     Pneumococcal vaccination given    -  Primary   PCV20 provided today.   Relevant Orders   Pneumococcal conjugate vaccine 20-valent (Prevnar 20) (Completed)   Encounter for screening mammogram for malignant neoplasm of breast       Mammogram ordered and provided instructions on how to schedule.   Relevant Orders   MM 3D SCREENING MAMMOGRAM BILATERAL BREAST   Encounter for annual physical exam       Annual physical today with labs and health maintenance reviewed, discussed with patient.        Follow up plan: Return in about 6 months (around 08/21/2023) for MOOD, HTN/HLD, THYROID.   LABORATORY TESTING:  - Pap smear: pap done  IMMUNIZATIONS:   - Tdap: Tetanus vaccination status reviewed: last tetanus booster within 10 years. - Influenza: Up to date - Pneumovax: Up To Date - Prevnar:Provided today - COVID: Up to date - HPV: Not applicable - Shingrix vaccine: Up to date  SCREENING: -Mammogram: Up to date  - Colonoscopy: Up to date  - Bone Density: Up to date  -Hearing Test: Not applicable  -Spirometry: Not applicable   PATIENT COUNSELING:   Advised to take 1 mg of folate supplement per day if capable of pregnancy.   Sexuality: Discussed sexually transmitted diseases, partner selection, use of condoms, avoidance of unintended pregnancy  and contraceptive alternatives.   Advised to avoid cigarette smoking.  I discussed with the patient that most people either abstain from alcohol or drink within safe limits (<=14/week and <=4 drinks/occasion for males, <=7/weeks and <= 3 drinks/occasion for females) and that the risk for alcohol disorders and other health effects rises proportionally with the number of drinks per week and how often a drinker exceeds daily limits.  Discussed cessation/primary prevention of  drug use and availability of treatment for abuse.   Diet: Encouraged to adjust caloric intake to maintain  or achieve ideal body weight, to reduce intake of dietary saturated fat and total fat, to limit sodium intake by avoiding high sodium foods and not adding table salt, and to maintain adequate dietary potassium and calcium preferably from fresh fruits, vegetables, and low-fat dairy products.    Stressed the importance of regular exercise  Injury prevention: Discussed safety belts, safety helmets, smoke detector, smoking near bedding or upholstery.   Dental health: Discussed importance of regular tooth brushing, flossing, and dental visits.    NEXT PREVENTATIVE PHYSICAL DUE IN 1 YEAR. Return in about 6 months (around 08/21/2023) for MOOD, HTN/HLD, THYROID.

## 2023-02-19 NOTE — Assessment & Plan Note (Signed)
Chronic, stable.  Continue current medication regimen and adjust as needed.  Labs obtained today.

## 2023-02-19 NOTE — Assessment & Plan Note (Signed)
Noted on lumbar imaging 11/04/21 -- continue statin daily and recommend ASA daily for prevention. ?

## 2023-02-19 NOTE — Assessment & Plan Note (Signed)
Scheduled for knee replacement left side upcoming.

## 2023-02-20 ENCOUNTER — Encounter: Payer: Self-pay | Admitting: Orthopedic Surgery

## 2023-02-20 ENCOUNTER — Encounter
Admission: RE | Admit: 2023-02-20 | Discharge: 2023-02-20 | Disposition: A | Discharge status: Home / Self Care | Payer: Medicare Other | Source: Ambulatory Visit | Attending: Orthopedic Surgery | Admitting: Orthopedic Surgery

## 2023-02-20 ENCOUNTER — Other Ambulatory Visit: Payer: Self-pay

## 2023-02-20 VITALS — BP 141/80 | HR 70 | Temp 97.1°F | Ht 64.5 in | Wt 201.0 lb

## 2023-02-20 DIAGNOSIS — I10 Essential (primary) hypertension: Secondary | ICD-10-CM | POA: Insufficient documentation

## 2023-02-20 DIAGNOSIS — N069 Isolated proteinuria with unspecified morphologic lesion: Secondary | ICD-10-CM | POA: Insufficient documentation

## 2023-02-20 DIAGNOSIS — Z01810 Encounter for preprocedural cardiovascular examination: Secondary | ICD-10-CM | POA: Diagnosis not present

## 2023-02-20 DIAGNOSIS — Z01818 Encounter for other preprocedural examination: Secondary | ICD-10-CM | POA: Insufficient documentation

## 2023-02-20 DIAGNOSIS — M1712 Unilateral primary osteoarthritis, left knee: Secondary | ICD-10-CM | POA: Insufficient documentation

## 2023-02-20 HISTORY — DX: Dyspnea, unspecified: R06.00

## 2023-02-20 HISTORY — DX: Atherosclerosis of aorta: I70.0

## 2023-02-20 HISTORY — DX: Hypothyroidism, unspecified: E03.9

## 2023-02-20 LAB — VITAMIN D 25 HYDROXY (VIT D DEFICIENCY, FRACTURES): Vit D, 25-Hydroxy: 33.6 ng/mL (ref 30.0–100.0)

## 2023-02-20 LAB — URINALYSIS, ROUTINE W REFLEX MICROSCOPIC
Bilirubin Urine: NEGATIVE
Glucose, UA: NEGATIVE mg/dL
Hgb urine dipstick: NEGATIVE
Ketones, ur: NEGATIVE mg/dL
Leukocytes,Ua: NEGATIVE
Nitrite: NEGATIVE
Protein, ur: NEGATIVE mg/dL
Specific Gravity, Urine: 1.018 (ref 1.005–1.030)
pH: 5 (ref 5.0–8.0)

## 2023-02-20 LAB — COMPREHENSIVE METABOLIC PANEL
ALT: 15 IU/L (ref 0–32)
AST: 21 IU/L (ref 0–40)
Albumin/Globulin Ratio: 1.8 (ref 1.2–2.2)
Albumin: 4.1 g/dL (ref 3.9–4.9)
Alkaline Phosphatase: 92 IU/L (ref 44–121)
BUN/Creatinine Ratio: 16 (ref 12–28)
BUN: 17 mg/dL (ref 8–27)
Bilirubin Total: 0.8 mg/dL (ref 0.0–1.2)
CO2: 25 mmol/L (ref 20–29)
Calcium: 9.4 mg/dL (ref 8.7–10.3)
Chloride: 103 mmol/L (ref 96–106)
Creatinine, Ser: 1.04 mg/dL — ABNORMAL HIGH (ref 0.57–1.00)
Globulin, Total: 2.3 g/dL (ref 1.5–4.5)
Glucose: 103 mg/dL — ABNORMAL HIGH (ref 70–99)
Potassium: 3.6 mmol/L (ref 3.5–5.2)
Sodium: 141 mmol/L (ref 134–144)
Total Protein: 6.4 g/dL (ref 6.0–8.5)
eGFR: 59 mL/min/{1.73_m2} — ABNORMAL LOW (ref >59)

## 2023-02-20 LAB — CBC WITH DIFFERENTIAL/PLATELET
Basophils Absolute: 0.1 10*3/uL (ref 0.0–0.2)
Basos: 1 %
EOS (ABSOLUTE): 0.3 10*3/uL (ref 0.0–0.4)
Eos: 5 %
Hematocrit: 41.8 % (ref 34.0–46.6)
Hemoglobin: 13.4 g/dL (ref 11.1–15.9)
Immature Grans (Abs): 0 10*3/uL (ref 0.0–0.1)
Immature Granulocytes: 0 %
Lymphocytes Absolute: 2 10*3/uL (ref 0.7–3.1)
Lymphs: 35 %
MCH: 29.6 pg (ref 26.6–33.0)
MCHC: 32.1 g/dL (ref 31.5–35.7)
MCV: 92 fL (ref 79–97)
Monocytes Absolute: 0.5 10*3/uL (ref 0.1–0.9)
Monocytes: 9 %
Neutrophils Absolute: 2.9 10*3/uL (ref 1.4–7.0)
Neutrophils: 50 %
Platelets: 239 10*3/uL (ref 150–450)
RBC: 4.53 x10E6/uL (ref 3.77–5.28)
RDW: 12.6 % (ref 11.7–15.4)
WBC: 5.8 10*3/uL (ref 3.4–10.8)

## 2023-02-20 LAB — TSH: TSH: 0.932 u[IU]/mL (ref 0.450–4.500)

## 2023-02-20 LAB — SURGICAL PCR SCREEN
MRSA, PCR: NEGATIVE
Staphylococcus aureus: POSITIVE — AB

## 2023-02-20 LAB — LIPID PANEL W/O CHOL/HDL RATIO
Cholesterol, Total: 165 mg/dL (ref 100–199)
HDL: 55 mg/dL (ref >39)
LDL Chol Calc (NIH): 93 mg/dL (ref 0–99)
Triglycerides: 93 mg/dL (ref 0–149)
VLDL Cholesterol Cal: 17 mg/dL (ref 5–40)

## 2023-02-20 LAB — TYPE AND SCREEN
ABO/RH(D): A POS
Antibody Screen: NEGATIVE

## 2023-02-20 LAB — SEDIMENTATION RATE: Sed Rate: 17 mm/hr (ref 0–30)

## 2023-02-20 LAB — T4, FREE: Free T4: 1.56 ng/dL (ref 0.82–1.77)

## 2023-02-20 NOTE — Progress Notes (Signed)
Contacted via MyChart   Good afternoon Leslie Turner, your labs have returned: - Kidney function, creatinine and eGFR, shows very mild reduction this check.  Ensure you are drinking plenty of water daily.  Liver function, AST and ALT, is normal. - Remainder of labs look great.  No medication changes needed.  We will recheck kidney function next visit.  Any questions? Keep being amazing!!  Thank you for allowing me to participate in your care.  I appreciate you. Kindest regards, Quanta Roher

## 2023-02-20 NOTE — Patient Instructions (Addendum)
Your procedure is scheduled on: 03/02/2023  Report to the Registration Desk on the 1st floor of the [facility]. To find out your arrival time, please call [phone] between 1PM - 3PM on: 03/01/2023  If your arrival time is 6:00 am, do not arrive before that time as the [facility] entrance doors do not open until 6:00 am.  REMEMBER: Instructions that are not followed completely may result in serious medical risk, up to and including death; or upon the discretion of your surgeon and anesthesiologist your surgery may need to be rescheduled.  Do not eat food after midnight the night before surgery.  No gum chewing or hard candies.  You may however, drink CLEAR liquids up to 2 hours before you are scheduled to arrive for your surgery. Do not drink anything within 2 hours of your scheduled arrival time.  Clear liquids include: - water  - apple juice without pulp - gatorade (not RED colors) - black coffee or tea (Do NOT add milk or creamers to the coffee or tea) Do NOT drink anything that is not on this list.     Preparing for Surgery with CHLORHEXIDINE GLUCONATE (CHG) Soap  Chlorhexidine Gluconate (CHG) Soap  o An antiseptic cleaner that kills germs and bonds with the skin to continue killing germs even after washing  o Used for showering the night before surgery and morning of surgery  Before surgery, you can play an important role by reducing the number of germs on your skin.  CHG (Chlorhexidine gluconate) soap is an antiseptic cleanser which kills germs and bonds with the skin to continue killing germs even after washing.  Please do not use if you have an allergy to CHG or antibacterial soaps. If your skin becomes reddened/irritated stop using the CHG.      This is Five days of CHG soap. The fifth day is the morning of surgery.  1. Shower the NIGHT BEFORE SURGERY and the MORNING OF SURGERY with CHG soap.   2. If you choose to wash your hair, wash your hair first as usual  with your normal shampoo.  3. After shampooing, rinse your hair and body thoroughly to remove the shampoo.  4. Use CHG as you would any other liquid soap. You can apply CHG directly to the skin and wash gently with a scrungie or a clean washcloth.  5. Apply the CHG soap to your body only from the neck down. Do not use on open wounds or open sores. Avoid contact with your eyes, ears, mouth, and genitals (private parts). Wash face and genitals (private parts) with your normal soap.  6. Wash thoroughly, paying special attention to the area where your surgery will be performed.  7. Thoroughly rinse your body with warm water.  8. Do not shower/wash with your normal soap after using and rinsing off the CHG soap.  9. Pat yourself dry with a clean towel.  10. Wear clean pajamas to bed the night before surgery.  12. Place clean sheets on your bed the night of your first shower and do not sleep with pets.  13. Shower again with the CHG soap on the day of surgery prior to arriving at the hospital.  14. Do not apply any deodorants/lotions/powders.  15. Please wear clean clothes to the hospital.    In addition, your doctor has ordered for you to drink the provided:  Ensure Pre-Surgery Clear Carbohydrate Drink   Drinking this carbohydrate drink up to two hours before surgery helps  to reduce insulin resistance and improve patient outcomes. Please complete drinking 2 hours before scheduled arrival time.  One week prior to surgery: Stop Anti-inflammatories (NSAIDS) such as Advil, Aleve, Ibuprofen, Motrin, Naproxen, Naprosyn and Aspirin based products such as Excedrin, Goody's Powder, BC Powder. Stop ANY OVER THE COUNTER supplements until after surgery like Melocicam You may however, continue to take Tylenol if needed for pain up until the day of surgery.like vit D and multivitamins  Continue taking all prescribed medications except Meloxicam.    TAKE ONLY THESE MEDICATIONS THE MORNING OF  SURGERY WITH A SIP OF WATER:   atorvastatin (LIPITOR)  levothyroxine (SYNTHROID)  sertraline (ZOLOFT)     No Alcohol for 24 hours before or after surgery.  No Smoking including e-cigarettes for 24 hours before surgery.  No chewable tobacco products for at least 6 hours before surgery.  No nicotine patches on the day of surgery.  Do not use any "recreational" drugs for at least a week (preferably 2 weeks) before your surgery.  Please be advised that the combination of cocaine and anesthesia may have negative outcomes, up to and including death. If you test positive for cocaine, your surgery will be cancelled.  On the morning of surgery brush your teeth with toothpaste and water, you may rinse your mouth with mouthwash if you wish. Do not swallow any toothpaste or mouthwash.  Use CHG Soap as directed on instruction sheet.  Do not wear jewelry, make-up, hairpins, clips or nail polish.  Do not wear lotions, powders, or perfumes.   Do not shave body hair from the neck down 48 hours before surgery.  Contact lenses, hearing aids and dentures may not be worn into surgery.  Do not bring valuables to the hospital. [facility] is not responsible for any missing/lost belongings or valuables.  .   Notify your doctor if there is any change in your medical condition (cold, fever, infection).  Wear comfortable clothing (specific to your surgery type) to the hospital.  After surgery, you can help prevent lung complications by doing breathing exercises.  Take deep breaths and cough every 1-2 hours. Your doctor may order a device called an Incentive Spirometer to help you take deep breaths.  If you are being admitted to the hospital overnight, leave your suitcase in the car. After surgery it may be brought to your room.  In case of increased patient census, it may be necessary for you, the patient, to continue your postoperative care in the Same Day Surgery department.  If you are being  discharged the day of surgery, you will not be allowed to drive home. You will need a responsible individual to drive you home and stay with you for 24 hours after surgery.    Please call the Pre-admissions Testing Dept. at [phone] if you have any questions about these instructions.  Surgery Visitation Policy:  Patients having surgery or a procedure may have two visitors.  Children under the age of 59 must have an adult with them who is not the patient.  Inpatient Visitation:    Visiting hours are 7 a.m. to 8 p.m. Up to four visitors are allowed at one time in a patient room. The visitors may rotate out with other people during the day.  One visitor age 4 or older may stay with the patient overnight and must be in the room by 8 p.m.  Preoperative Educational Videos for Total Hip, Knee and Shoulder Replacements  To better prepare for surgery, please view  our videos that explain the physical activity and discharge planning required to have the best surgical recovery at [facility].  [email or web address]    Questions? Call [phone] or email [email or web address]

## 2023-02-21 LAB — C-REACTIVE PROTEIN: CRP: 0.6 mg/dL (ref <1.0)

## 2023-03-02 ENCOUNTER — Observation Stay
Admission: RE | Admit: 2023-03-02 | Discharge: 2023-03-03 | Disposition: A | Discharge status: Home Health | Payer: Medicare Other | Source: Ambulatory Visit | Attending: Orthopedic Surgery | Admitting: Orthopedic Surgery

## 2023-03-02 ENCOUNTER — Observation Stay: Payer: Medicare Other | Admitting: Certified Registered"

## 2023-03-02 ENCOUNTER — Other Ambulatory Visit: Payer: Self-pay

## 2023-03-02 ENCOUNTER — Encounter
Admission: RE | Disposition: A | Discharge status: Home Health | Payer: Self-pay | Source: Ambulatory Visit | Attending: Orthopedic Surgery

## 2023-03-02 ENCOUNTER — Observation Stay: Payer: Medicare Other

## 2023-03-02 ENCOUNTER — Encounter: Payer: Self-pay | Admitting: Nurse Practitioner

## 2023-03-02 ENCOUNTER — Encounter: Payer: Self-pay | Admitting: Orthopedic Surgery

## 2023-03-02 DIAGNOSIS — I10 Essential (primary) hypertension: Secondary | ICD-10-CM | POA: Diagnosis not present

## 2023-03-02 DIAGNOSIS — Z87891 Personal history of nicotine dependence: Secondary | ICD-10-CM | POA: Diagnosis not present

## 2023-03-02 DIAGNOSIS — E039 Hypothyroidism, unspecified: Secondary | ICD-10-CM | POA: Diagnosis not present

## 2023-03-02 DIAGNOSIS — Z96652 Presence of left artificial knee joint: Secondary | ICD-10-CM

## 2023-03-02 DIAGNOSIS — Z79899 Other long term (current) drug therapy: Secondary | ICD-10-CM | POA: Insufficient documentation

## 2023-03-02 DIAGNOSIS — M1712 Unilateral primary osteoarthritis, left knee: Principal | ICD-10-CM | POA: Insufficient documentation

## 2023-03-02 DIAGNOSIS — Z96659 Presence of unspecified artificial knee joint: Secondary | ICD-10-CM

## 2023-03-02 DIAGNOSIS — N069 Isolated proteinuria with unspecified morphologic lesion: Secondary | ICD-10-CM

## 2023-03-02 HISTORY — DX: Unilateral primary osteoarthritis, left knee: M17.12

## 2023-03-02 HISTORY — PX: KNEE ARTHROPLASTY: SHX992

## 2023-03-02 LAB — COMPREHENSIVE METABOLIC PANEL
ALT: 17 U/L (ref 0–44)
AST: 25 U/L (ref 15–41)
Albumin: 3.8 g/dL (ref 3.5–5.0)
Alkaline Phosphatase: 73 U/L (ref 38–126)
Anion gap: 9 (ref 5–15)
BUN: 19 mg/dL (ref 8–23)
CO2: 24 mmol/L (ref 22–32)
Calcium: 9 mg/dL (ref 8.9–10.3)
Chloride: 103 mmol/L (ref 98–111)
Creatinine, Ser: 0.94 mg/dL (ref 0.44–1.00)
GFR, Estimated: >60 mL/min (ref >60)
Glucose, Bld: 102 mg/dL — ABNORMAL HIGH (ref 70–99)
Potassium: 2.9 mmol/L — ABNORMAL LOW (ref 3.5–5.1)
Sodium: 136 mmol/L (ref 135–145)
Total Bilirubin: 0.7 mg/dL (ref 0.3–1.2)
Total Protein: 7.3 g/dL (ref 6.5–8.1)

## 2023-03-02 LAB — CBC
HCT: 40.7 % (ref 36.0–46.0)
Hemoglobin: 13.9 g/dL (ref 12.0–15.0)
MCH: 30.4 pg (ref 26.0–34.0)
MCHC: 34.2 g/dL (ref 30.0–36.0)
MCV: 89.1 fL (ref 80.0–100.0)
Platelets: 254 10*3/uL (ref 150–400)
RBC: 4.57 MIL/uL (ref 3.87–5.11)
RDW: 12.7 % (ref 11.5–15.5)
WBC: 8.1 10*3/uL (ref 4.0–10.5)
nRBC: 0 % (ref 0.0–0.2)

## 2023-03-02 LAB — ABO/RH: ABO/RH(D): A POS

## 2023-03-02 SURGERY — ARTHROPLASTY, KNEE, TOTAL, USING IMAGELESS COMPUTER-ASSISTED NAVIGATION
Anesthesia: Spinal | Site: Knee | Laterality: Left

## 2023-03-02 MED ORDER — TRANEXAMIC ACID-NACL 1000-0.7 MG/100ML-% IV SOLN
INTRAVENOUS | Status: AC
  Filled 2023-03-02: qty 100

## 2023-03-02 MED ORDER — BUPIVACAINE LIPOSOME 1.3 % IJ SUSP
INTRAMUSCULAR | Status: AC
  Filled 2023-03-02: qty 20

## 2023-03-02 MED ORDER — FAMOTIDINE 20 MG PO TABS
20.0000 mg | ORAL_TABLET | Freq: Once | ORAL | Status: AC
  Administered 2023-03-02: 20 mg via ORAL

## 2023-03-02 MED ORDER — MUPIROCIN 2 % EX OINT: TOPICAL_OINTMENT | Freq: Two times a day (BID) | CUTANEOUS | Status: DC

## 2023-03-02 MED ORDER — ACETAMINOPHEN 10 MG/ML IV SOLN
INTRAVENOUS | Status: DC | PRN
  Administered 2023-03-02: 1000 mg via INTRAVENOUS

## 2023-03-02 MED ORDER — SURGIRINSE WOUND IRRIGATION SYSTEM - OPTIME: TOPICAL | Status: DC | PRN

## 2023-03-02 MED ORDER — MIDAZOLAM HCL 2 MG/2ML IJ SOLN
INTRAMUSCULAR | Status: AC
  Filled 2023-03-02: qty 2

## 2023-03-02 MED ORDER — MAGNESIUM HYDROXIDE 400 MG/5ML PO SUSP
30.0000 mL | Freq: Every day | ORAL | Status: DC
  Administered 2023-03-03: 30 mL via ORAL
  Filled 2023-03-02: qty 30

## 2023-03-02 MED ORDER — OXYCODONE HCL 5 MG/5ML PO SOLN: 5.0000 mg | Freq: Once | ORAL | Status: DC | PRN

## 2023-03-02 MED ORDER — ONDANSETRON HCL 4 MG PO TABS: 4.0000 mg | ORAL_TABLET | Freq: Four times a day (QID) | ORAL | 0 refills | Status: DC | PRN

## 2023-03-02 MED ORDER — SENNOSIDES-DOCUSATE SODIUM 8.6-50 MG PO TABS
1.0000 | ORAL_TABLET | Freq: Two times a day (BID) | ORAL | Status: DC
  Administered 2023-03-02 – 2023-03-03 (×2): 1 via ORAL
  Filled 2023-03-02 (×2): qty 1

## 2023-03-02 MED ORDER — LACTATED RINGERS IV SOLN: INTRAVENOUS | Status: DC

## 2023-03-02 MED ORDER — MIDAZOLAM HCL 5 MG/5ML IJ SOLN
INTRAMUSCULAR | Status: DC | PRN
  Administered 2023-03-02: 2 mg via INTRAVENOUS

## 2023-03-02 MED ORDER — SERTRALINE HCL 50 MG PO TABS
50.0000 mg | ORAL_TABLET | Freq: Every day | ORAL | Status: DC
  Administered 2023-03-03: 50 mg via ORAL
  Filled 2023-03-02: qty 1

## 2023-03-02 MED ORDER — ACETAMINOPHEN 500 MG PO TABS: 1000.0000 mg | ORAL_TABLET | Freq: Four times a day (QID) | ORAL | Status: DC | PRN

## 2023-03-02 MED ORDER — ONDANSETRON HCL 4 MG/2ML IJ SOLN
INTRAMUSCULAR | Status: AC
  Filled 2023-03-02: qty 2

## 2023-03-02 MED ORDER — CEFAZOLIN SODIUM-DEXTROSE 2-4 GM/100ML-% IV SOLN
2.0000 g | INTRAVENOUS | Status: AC
  Administered 2023-03-02: 2 g via INTRAVENOUS

## 2023-03-02 MED ORDER — CHLORHEXIDINE GLUCONATE 0.12 % MT SOLN
OROMUCOSAL | Status: AC
  Filled 2023-03-02: qty 15

## 2023-03-02 MED ORDER — LEVOTHYROXINE SODIUM 112 MCG PO TABS
112.0000 ug | ORAL_TABLET | Freq: Every day | ORAL | Status: DC
  Administered 2023-03-03: 112 ug via ORAL
  Filled 2023-03-02: qty 1

## 2023-03-02 MED ORDER — CEFAZOLIN SODIUM-DEXTROSE 2-4 GM/100ML-% IV SOLN
INTRAVENOUS | Status: AC
  Filled 2023-03-02: qty 100

## 2023-03-02 MED ORDER — FENTANYL CITRATE (PF) 100 MCG/2ML IJ SOLN: 25.0000 ug | INTRAMUSCULAR | Status: DC | PRN

## 2023-03-02 MED ORDER — HYDROCHLOROTHIAZIDE 12.5 MG PO TABS
12.5000 mg | ORAL_TABLET | Freq: Every day | ORAL | Status: DC
  Administered 2023-03-02 – 2023-03-03 (×2): 12.5 mg via ORAL
  Filled 2023-03-02 (×2): qty 1

## 2023-03-02 MED ORDER — HYDROMORPHONE HCL 1 MG/ML IJ SOLN: 0.5000 mg | INTRAMUSCULAR | Status: DC | PRN

## 2023-03-02 MED ORDER — CEFAZOLIN SODIUM-DEXTROSE 2-4 GM/100ML-% IV SOLN
2.0000 g | Freq: Four times a day (QID) | INTRAVENOUS | Status: AC
  Administered 2023-03-02 – 2023-03-03 (×2): 2 g via INTRAVENOUS
  Filled 2023-03-02 (×3): qty 100

## 2023-03-02 MED ORDER — TRANEXAMIC ACID-NACL 1000-0.7 MG/100ML-% IV SOLN
1000.0000 mg | INTRAVENOUS | Status: AC
  Administered 2023-03-02: 1000 mg via INTRAVENOUS

## 2023-03-02 MED ORDER — PROPOFOL 1000 MG/100ML IV EMUL
INTRAVENOUS | Status: AC
  Filled 2023-03-02: qty 100

## 2023-03-02 MED ORDER — LIDOCAINE HCL (PF) 2 % IJ SOLN
INTRAMUSCULAR | Status: AC
  Filled 2023-03-02: qty 5

## 2023-03-02 MED ORDER — ENOXAPARIN SODIUM 30 MG/0.3ML IJ SOSY
30.0000 mg | PREFILLED_SYRINGE | Freq: Two times a day (BID) | INTRAMUSCULAR | Status: DC
  Administered 2023-03-03: 30 mg via SUBCUTANEOUS
  Filled 2023-03-02: qty 0.3

## 2023-03-02 MED ORDER — CELECOXIB 200 MG PO CAPS
400.0000 mg | ORAL_CAPSULE | Freq: Once | ORAL | Status: AC
  Administered 2023-03-02: 400 mg via ORAL

## 2023-03-02 MED ORDER — GABAPENTIN 300 MG PO CAPS
ORAL_CAPSULE | ORAL | Status: AC
  Filled 2023-03-02: qty 1

## 2023-03-02 MED ORDER — CELECOXIB 200 MG PO CAPS: 200.0000 mg | ORAL_CAPSULE | Freq: Two times a day (BID) | ORAL | 0 refills | Status: AC

## 2023-03-02 MED ORDER — CELECOXIB 200 MG PO CAPS
200.0000 mg | ORAL_CAPSULE | Freq: Two times a day (BID) | ORAL | Status: DC
  Administered 2023-03-03: 200 mg via ORAL
  Filled 2023-03-02: qty 1

## 2023-03-02 MED ORDER — GABAPENTIN 300 MG PO CAPS
300.0000 mg | ORAL_CAPSULE | Freq: Once | ORAL | Status: AC
  Administered 2023-03-02: 300 mg via ORAL

## 2023-03-02 MED ORDER — CHLORHEXIDINE GLUCONATE 0.12 % MT SOLN
15.0000 mL | Freq: Once | OROMUCOSAL | Status: AC
  Administered 2023-03-02: 15 mL via OROMUCOSAL

## 2023-03-02 MED ORDER — FLEET ENEMA 7-19 GM/118ML RE ENEM: 1.0000 | ENEMA | Freq: Once | RECTAL | Status: DC | PRN

## 2023-03-02 MED ORDER — OXYCODONE HCL 5 MG PO TABS: 5.0000 mg | ORAL_TABLET | ORAL | Status: DC | PRN

## 2023-03-02 MED ORDER — ACETAMINOPHEN 10 MG/ML IV SOLN
INTRAVENOUS | Status: AC
  Filled 2023-03-02: qty 100

## 2023-03-02 MED ORDER — LOSARTAN POTASSIUM 50 MG PO TABS
50.0000 mg | ORAL_TABLET | Freq: Every day | ORAL | Status: DC
  Administered 2023-03-02 – 2023-03-03 (×2): 50 mg via ORAL
  Filled 2023-03-02 (×2): qty 1

## 2023-03-02 MED ORDER — CELECOXIB 200 MG PO CAPS
ORAL_CAPSULE | ORAL | Status: AC
  Filled 2023-03-02: qty 1

## 2023-03-02 MED ORDER — PHENYLEPHRINE HCL-NACL 20-0.9 MG/250ML-% IV SOLN
INTRAVENOUS | Status: DC | PRN
  Administered 2023-03-02: 40 ug/min via INTRAVENOUS

## 2023-03-02 MED ORDER — METOCLOPRAMIDE HCL 5 MG PO TABS
10.0000 mg | ORAL_TABLET | Freq: Three times a day (TID) | ORAL | Status: DC
  Administered 2023-03-02 – 2023-03-03 (×3): 10 mg via ORAL
  Filled 2023-03-02 (×4): qty 2

## 2023-03-02 MED ORDER — TRAMADOL HCL 50 MG PO TABS: 50.0000 mg | ORAL_TABLET | ORAL | 0 refills | Status: DC | PRN

## 2023-03-02 MED ORDER — ONDANSETRON HCL 4 MG PO TABS: 4.0000 mg | ORAL_TABLET | Freq: Four times a day (QID) | ORAL | Status: DC | PRN

## 2023-03-02 MED ORDER — TRANEXAMIC ACID-NACL 1000-0.7 MG/100ML-% IV SOLN
1000.0000 mg | Freq: Once | INTRAVENOUS | Status: AC
  Administered 2023-03-02: 1000 mg via INTRAVENOUS

## 2023-03-02 MED ORDER — SODIUM CHLORIDE 0.9 % IV SOLN: INTRAVENOUS | Status: DC

## 2023-03-02 MED ORDER — ALUM & MAG HYDROXIDE-SIMETH 200-200-20 MG/5ML PO SUSP: 30.0000 mL | ORAL | Status: DC | PRN

## 2023-03-02 MED ORDER — ACETAMINOPHEN 10 MG/ML IV SOLN
1000.0000 mg | Freq: Four times a day (QID) | INTRAVENOUS | Status: DC
  Administered 2023-03-02 – 2023-03-03 (×2): 1000 mg via INTRAVENOUS
  Filled 2023-03-02 (×3): qty 100

## 2023-03-02 MED ORDER — BISACODYL 10 MG RE SUPP: 10.0000 mg | Freq: Every day | RECTAL | Status: DC | PRN

## 2023-03-02 MED ORDER — ONDANSETRON HCL 4 MG/2ML IJ SOLN
INTRAMUSCULAR | Status: DC | PRN
  Administered 2023-03-02: 4 mg via INTRAVENOUS

## 2023-03-02 MED ORDER — OXYCODONE HCL 5 MG PO TABS
10.0000 mg | ORAL_TABLET | ORAL | Status: DC | PRN
  Administered 2023-03-02 – 2023-03-03 (×2): 10 mg via ORAL
  Filled 2023-03-02 (×2): qty 2

## 2023-03-02 MED ORDER — SODIUM CHLORIDE FLUSH 0.9 % IV SOLN
INTRAVENOUS | Status: AC
  Filled 2023-03-02: qty 40

## 2023-03-02 MED ORDER — SODIUM CHLORIDE 0.9 % IR SOLN
Status: DC | PRN
  Administered 2023-03-02: 3000 mL

## 2023-03-02 MED ORDER — DEXAMETHASONE SODIUM PHOSPHATE 10 MG/ML IJ SOLN
INTRAMUSCULAR | Status: AC
  Filled 2023-03-02: qty 1

## 2023-03-02 MED ORDER — ATORVASTATIN CALCIUM 20 MG PO TABS
40.0000 mg | ORAL_TABLET | Freq: Every day | ORAL | Status: DC
  Administered 2023-03-02 – 2023-03-03 (×2): 40 mg via ORAL
  Filled 2023-03-02 (×2): qty 2

## 2023-03-02 MED ORDER — TRAMADOL HCL 50 MG PO TABS
50.0000 mg | ORAL_TABLET | ORAL | Status: DC | PRN
  Filled 2023-03-02: qty 1

## 2023-03-02 MED ORDER — DEXAMETHASONE SODIUM PHOSPHATE 10 MG/ML IJ SOLN
8.0000 mg | Freq: Once | INTRAMUSCULAR | Status: AC
  Administered 2023-03-02: 8 mg via INTRAVENOUS

## 2023-03-02 MED ORDER — ONDANSETRON HCL 4 MG/2ML IJ SOLN: 4.0000 mg | Freq: Four times a day (QID) | INTRAMUSCULAR | Status: DC | PRN

## 2023-03-02 MED ORDER — DIPHENHYDRAMINE HCL 12.5 MG/5ML PO ELIX: 12.5000 mg | ORAL_SOLUTION | ORAL | Status: DC | PRN

## 2023-03-02 MED ORDER — FAMOTIDINE 20 MG PO TABS
ORAL_TABLET | ORAL | Status: AC
  Filled 2023-03-02: qty 1

## 2023-03-02 MED ORDER — OXYCODONE HCL 5 MG PO TABS: 5.0000 mg | ORAL_TABLET | Freq: Once | ORAL | Status: DC | PRN

## 2023-03-02 MED ORDER — SENNOSIDES-DOCUSATE SODIUM 8.6-50 MG PO TABS: 1.0000 | ORAL_TABLET | Freq: Two times a day (BID) | ORAL | 0 refills | Status: DC

## 2023-03-02 MED ORDER — CEFAZOLIN SODIUM-DEXTROSE 2-4 GM/100ML-% IV SOLN
2.0000 g | Freq: Four times a day (QID) | INTRAVENOUS | Status: DC
  Filled 2023-03-02 (×2): qty 100

## 2023-03-02 MED ORDER — PHENOL 1.4 % MT LIQD: 1.0000 | OROMUCOSAL | Status: DC | PRN

## 2023-03-02 MED ORDER — MENTHOL 3 MG MT LOZG: 1.0000 | LOZENGE | OROMUCOSAL | Status: DC | PRN

## 2023-03-02 MED ORDER — BUPIVACAINE HCL (PF) 0.25 % IJ SOLN
INTRAMUSCULAR | Status: DC | PRN
  Administered 2023-03-02: 60 mL

## 2023-03-02 MED ORDER — PROPOFOL 10 MG/ML IV BOLUS
INTRAVENOUS | Status: DC | PRN
  Administered 2023-03-02: 30 mg via INTRAVENOUS
  Administered 2023-03-02: 50 ug/kg/min via INTRAVENOUS

## 2023-03-02 MED ORDER — BUPIVACAINE HCL (PF) 0.5 % IJ SOLN
INTRAMUSCULAR | Status: DC | PRN
  Administered 2023-03-02: 3 mL via INTRATHECAL

## 2023-03-02 MED ORDER — PANTOPRAZOLE SODIUM 40 MG PO TBEC
40.0000 mg | DELAYED_RELEASE_TABLET | Freq: Two times a day (BID) | ORAL | Status: DC
  Administered 2023-03-02 – 2023-03-03 (×2): 40 mg via ORAL
  Filled 2023-03-02 (×2): qty 1

## 2023-03-02 MED ORDER — PHENYLEPHRINE HCL-NACL 20-0.9 MG/250ML-% IV SOLN
INTRAVENOUS | Status: AC
  Filled 2023-03-02: qty 250

## 2023-03-02 MED ORDER — ENOXAPARIN SODIUM 40 MG/0.4ML IJ SOSY: 40.0000 mg | PREFILLED_SYRINGE | INTRAMUSCULAR | 0 refills | Status: DC

## 2023-03-02 MED ORDER — ENSURE PRE-SURGERY PO LIQD
296.0000 mL | Freq: Once | ORAL | Status: AC
  Administered 2023-03-02: 296 mL via ORAL
  Filled 2023-03-02: qty 296

## 2023-03-02 MED ORDER — SODIUM CHLORIDE 0.9 % IV SOLN
INTRAVENOUS | Status: DC | PRN
  Administered 2023-03-02: 60 mL

## 2023-03-02 MED ORDER — ACETAMINOPHEN 325 MG PO TABS: 325.0000 mg | ORAL_TABLET | Freq: Four times a day (QID) | ORAL | Status: DC | PRN

## 2023-03-02 MED ORDER — ORAL CARE MOUTH RINSE: 15.0000 mL | Freq: Once | OROMUCOSAL | Status: AC

## 2023-03-02 MED ORDER — OXYCODONE HCL 5 MG PO TABS: 5.0000 mg | ORAL_TABLET | ORAL | 0 refills | Status: DC | PRN

## 2023-03-02 MED ORDER — BUPIVACAINE HCL (PF) 0.25 % IJ SOLN
INTRAMUSCULAR | Status: AC
  Filled 2023-03-02: qty 60

## 2023-03-02 SURGICAL SUPPLY — 77 items
ATTUNE PS FEM LT SZ 6 CEM KNEE (Femur) IMPLANT
ATTUNE PSRP INSR SZ6 5 KNEE (Insert) IMPLANT
BASE TIBIA ATTUNE KNEE SYS SZ6 (Knees) IMPLANT
BATTERY INSTRU NAVIGATION (MISCELLANEOUS) ×4 IMPLANT
BLADE SAW 70X12.5 (BLADE) ×1 IMPLANT
BLADE SAW 90X13X1.19 OSCILLAT (BLADE) ×1 IMPLANT
BLADE SAW 90X25X1.19 OSCILLAT (BLADE) ×1 IMPLANT
BONE CEMENT GENTAMICIN (Cement) ×2 IMPLANT
BSPLAT TIB 6 CMNT ROT PLAT STR (Knees) ×1 IMPLANT
BTRY SRG DRVR LF (MISCELLANEOUS) ×4
CEMENT BONE GENTAMICIN 40 (Cement) IMPLANT
COOLER POLAR GLACIER W/PUMP (MISCELLANEOUS) ×1 IMPLANT
CUFF TOURN SGL QUICK 24 (TOURNIQUET CUFF)
CUFF TOURN SGL QUICK 34 (TOURNIQUET CUFF)
CUFF TRNQT CYL 24X4X16.5-23 (TOURNIQUET CUFF) IMPLANT
CUFF TRNQT CYL 34X4.125X (TOURNIQUET CUFF) IMPLANT
DRAPE 3/4 80X56 (DRAPES) ×1 IMPLANT
DRAPE INCISE IOBAN 66X45 STRL (DRAPES) IMPLANT
DRSG AQUACEL AG ADV 3.5X14 (GAUZE/BANDAGES/DRESSINGS) ×1 IMPLANT
DRSG DERMACEA NONADH 3X8 (GAUZE/BANDAGES/DRESSINGS) ×1 IMPLANT
DRSG MEPILEX SACRM 8.7X9.8 (GAUZE/BANDAGES/DRESSINGS) ×1 IMPLANT
DRSG TEGADERM 4X4.75 (GAUZE/BANDAGES/DRESSINGS) ×1 IMPLANT
DURAPREP 26ML APPLICATOR (WOUND CARE) ×2 IMPLANT
ELECT CAUTERY BLADE 6.4 (BLADE) ×1 IMPLANT
ELECT REM PT RETURN 9FT ADLT (ELECTROSURGICAL) ×1
ELECTRODE REM PT RTRN 9FT ADLT (ELECTROSURGICAL) ×1 IMPLANT
EX-PIN ORTHOLOCK NAV 4X150 (PIN) ×2 IMPLANT
GLOVE BIOGEL M STRL SZ7.5 (GLOVE) ×2 IMPLANT
GLOVE SRG 8 PF TXTR STRL LF DI (GLOVE) ×2 IMPLANT
GLOVE SURG SYN 8.0 (GLOVE) ×1 IMPLANT
GLOVE SURG SYN 8.0 PF PI (GLOVE) ×1 IMPLANT
GLOVE SURG UNDER POLY LF SZ8 (GLOVE) ×2
GOWN STRL REUS W/ TWL LRG LVL3 (GOWN DISPOSABLE) ×1 IMPLANT
GOWN STRL REUS W/ TWL XL LVL3 (GOWN DISPOSABLE) ×1 IMPLANT
GOWN STRL REUS W/TWL LRG LVL3 (GOWN DISPOSABLE) ×1
GOWN STRL REUS W/TWL XL LVL3 (GOWN DISPOSABLE) ×1
GOWN TOGA ZIPPER T7+ PEEL AWAY (MISCELLANEOUS) ×1 IMPLANT
HANDLE YANKAUER SUCT OPEN TIP (MISCELLANEOUS) ×1 IMPLANT
HEMOVAC 400CC 10FR (MISCELLANEOUS) ×1 IMPLANT
HOLDER FOLEY CATH W/STRAP (MISCELLANEOUS) ×1 IMPLANT
HOOD PEEL AWAY T7 (MISCELLANEOUS) ×1 IMPLANT
IV NS IRRIG 3000ML ARTHROMATIC (IV SOLUTION) ×1 IMPLANT
KIT TURNOVER KIT A (KITS) ×1 IMPLANT
KNIFE SCULPS 14X20 (INSTRUMENTS) ×1 IMPLANT
MANIFOLD NEPTUNE II (INSTRUMENTS) ×2 IMPLANT
NDL SPNL 20GX3.5 QUINCKE YW (NEEDLE) ×2 IMPLANT
NEEDLE SPNL 20GX3.5 QUINCKE YW (NEEDLE) ×2 IMPLANT
NS IRRIG 500ML POUR BTL (IV SOLUTION) ×1 IMPLANT
PACK TOTAL KNEE (MISCELLANEOUS) ×1 IMPLANT
PAD ABD DERMACEA PRESS 5X9 (GAUZE/BANDAGES/DRESSINGS) ×2 IMPLANT
PAD WRAPON POLAR KNEE (MISCELLANEOUS) ×1 IMPLANT
PATELLA MEDIAL ATTUN 35MM KNEE (Knees) IMPLANT
PIN DRILL FIX HALF THREAD (BIT) ×2 IMPLANT
PIN DRILL QUICK PACK (PIN) ×2 IMPLANT
PIN FIXATION 1/8DIA X 3INL (PIN) ×1 IMPLANT
PULSAVAC PLUS IRRIG FAN TIP (DISPOSABLE) ×1
SOL PREP PVP 2OZ (MISCELLANEOUS) ×1
SOLUTION IRRIG SURGIPHOR (IV SOLUTION) ×1 IMPLANT
SOLUTION PREP PVP 2OZ (MISCELLANEOUS) ×1 IMPLANT
SPONGE DRAIN TRACH 4X4 STRL 2S (GAUZE/BANDAGES/DRESSINGS) ×1 IMPLANT
STAPLER SKIN PROX 35W (STAPLE) ×1 IMPLANT
STOCKINETTE IMPERV 14X48 (MISCELLANEOUS) ×1 IMPLANT
STRAP TIBIA SHORT (MISCELLANEOUS) ×1 IMPLANT
SUCTION FRAZIER HANDLE 10FR (MISCELLANEOUS) ×1
SUCTION TUBE FRAZIER 10FR DISP (MISCELLANEOUS) ×1 IMPLANT
SUT VIC AB 0 CT1 36 (SUTURE) ×1 IMPLANT
SUT VIC AB 1 CT1 36 (SUTURE) ×2 IMPLANT
SUT VIC AB 2-0 CT2 27 (SUTURE) ×1 IMPLANT
SYR 30ML LL (SYRINGE) ×2 IMPLANT
TIBIA ATTUNE KNEE SYS BASE SZ6 (Knees) ×1 IMPLANT
TIP FAN IRRIG PULSAVAC PLUS (DISPOSABLE) ×1 IMPLANT
TOWEL OR 17X26 4PK STRL BLUE (TOWEL DISPOSABLE) IMPLANT
TOWER CARTRIDGE SMART MIX (DISPOSABLE) ×1 IMPLANT
TRAP FLUID SMOKE EVACUATOR (MISCELLANEOUS) ×1 IMPLANT
TRAY FOLEY MTR SLVR 16FR STAT (SET/KITS/TRAYS/PACK) ×1 IMPLANT
WATER STERILE IRR 1000ML POUR (IV SOLUTION) ×1 IMPLANT
WRAPON POLAR PAD KNEE (MISCELLANEOUS) ×1

## 2023-03-02 NOTE — Interval H&P Note (Signed)
History and Physical Interval Note:  03/02/2023 11:05 AM  Leslie Turner  has presented today for surgery, with the diagnosis of PRIMARY OSTEOARTHRITIS OF LEFT KNEE..  The various methods of treatment have been discussed with the patient and family. After consideration of risks, benefits and other options for treatment, the patient has consented to  Procedure(s): COMPUTER ASSISTED TOTAL KNEE ARTHROPLASTY - RNFA (Left) as a surgical intervention.  The patient's history has been reviewed, patient examined, no change in status, stable for surgery.  I have reviewed the patient's chart and labs.  Questions were answered to the patient's satisfaction.     [name]

## 2023-03-02 NOTE — Anesthesia Procedure Notes (Signed)
Spinal  Patient location during procedure: OR Start time: 03/02/2023 11:36 AM End time: 03/02/2023 11:47 AM Reason for block: surgical anesthesia Staffing Performed: resident/CRNA  Resident/CRNA: Morene Crocker, CRNA Performed by: Morene Crocker, CRNA Authorized by: Louie Boston, MD   Preanesthetic Checklist Completed: patient identified, IV checked, site marked, risks and benefits discussed, surgical consent, monitors and equipment checked, pre-op evaluation and timeout performed Spinal Block Patient position: sitting Prep: ChloraPrep Patient monitoring: heart rate, continuous pulse ox and blood pressure Approach: midline Location: L4-5 Injection technique: single-shot Needle Needle type: Introducer and Pencan  Needle gauge: 24 G Needle length: 9 cm Assessment Sensory level: T8 Events: CSF return Additional Notes Negative paresthesia. Negative blood return. Positive free-flowing CSF. Expiration date of kit checked and confirmed. Patient tolerated procedure well, without complications. Successful on first attempt, no complications noted. Pt. Denies any pain/discomfort.

## 2023-03-02 NOTE — Op Note (Signed)
OPERATIVE NOTE  DATE OF SURGERY:  03/02/2023  PATIENT NAME:  LACHON MARINKO   DOB: 10-04-1956  MRN: [id number]  PRE-OPERATIVE DIAGNOSIS: Degenerative arthrosis of the left knee, primary  POST-OPERATIVE DIAGNOSIS:  Same  PROCEDURE:  Left total knee arthroplasty using computer-assisted navigation  SURGEON:  Jena Gauss. M.D.  ASSISTANT:  Gean Birchwood, PA-C (present and scrubbed throughout the case, critical for assistance with exposure, retraction, instrumentation, and closure)  ANESTHESIA: spinal  ESTIMATED BLOOD LOSS: 50 mL  FLUIDS REPLACED: 1200 mL of crystalloid  TOURNIQUET TIME: 82 minutes  DRAINS: 2 medium Hemovac drains  SOFT TISSUE RELEASES: Anterior cruciate ligament, posterior cruciate ligament, deep and superficial medial collateral ligament, patellofemoral ligament  IMPLANTS UTILIZED: DePuy Attune size 6 posterior stabilized femoral component (cemented), size 6 rotating platform tibial component (cemented), 35 mm medialized dome patella (cemented), and a 5 mm stabilized rotating platform polyethylene insert.  INDICATIONS FOR SURGERY: MAUNA GLAZEWSKI is a 67 y.o. year old female with a long history of progressive knee pain. X-rays demonstrated severe degenerative changes in tricompartmental fashion. The patient had not seen any significant improvement despite conservative nonsurgical intervention. After discussion of the risks and benefits of surgical intervention, the patient expressed understanding of the risks benefits and agree with plans for total knee arthroplasty.   The risks, benefits, and alternatives were discussed at length including but not limited to the risks of infection, bleeding, nerve injury, stiffness, blood clots, the need for revision surgery, cardiopulmonary complications, among others, and they were willing to proceed.  PROCEDURE IN DETAIL: The patient was brought into the operating room and, after adequate spinal anesthesia was achieved, a  tourniquet was placed on the patient's upper thigh. The patient's knee and leg were cleaned and prepped with alcohol and DuraPrep and draped in the usual sterile fashion. A "timeout" was performed as per usual protocol. The lower extremity was exsanguinated using an Esmarch, and the tourniquet was inflated to 300 mmHg. An anterior longitudinal incision was made followed by a standard mid vastus approach. The deep fibers of the medial collateral ligament were elevated in a subperiosteal fashion off of the medial flare of the tibia so as to maintain a continuous soft tissue sleeve. The patella was subluxed laterally and the patellofemoral ligament was incised. Inspection of the knee demonstrated severe degenerative changes with full-thickness loss of articular cartilage. Osteophytes were debrided using a rongeur. Anterior and posterior cruciate ligaments were excised. Two 4.0 mm Schanz pins were inserted in the femur and into the tibia for attachment of the array of trackers used for computer-assisted navigation. Hip center was identified using a circumduction technique. Distal landmarks were mapped using the computer. The distal femur and proximal tibia were mapped using the computer. The distal femoral cutting guide was positioned using computer-assisted navigation so as to achieve a 5 distal valgus cut. The femur was sized and it was felt that a size 6 femoral component was appropriate. A size 6 femoral cutting guide was positioned and the anterior cut was performed and verified using the computer. This was followed by completion of the posterior and chamfer cuts. Femoral cutting guide for the central box was then positioned in the center box cut was performed.  Attention was then directed to the proximal tibia. Medial and lateral menisci were excised. The extramedullary tibial cutting guide was positioned using computer-assisted navigation so as to achieve a 0 varus-valgus alignment and 3 posterior slope. The  cut was performed and verified using the computer. The  proximal tibia was sized and it was felt that a size 6 tibial tray was appropriate. Tibial and femoral trials were inserted followed by insertion of a 5 mm polyethylene insert. The knee was felt to be tight medially. A Cobb elevator was used to elevate the superficial fibers of the medial collateral ligament.  This allowed for excellent mediolateral soft tissue balancing both in flexion and in full extension. Finally, the patella was cut and prepared so as to accommodate a 35 mm medialized dome patella. A patella trial was placed and the knee was placed through a range of motion with excellent patellar tracking appreciated. The femoral trial was removed after debridement of posterior osteophytes. The central post-hole for the tibial component was reamed followed by insertion of a keel punch. Tibial trials were then removed. Cut surfaces of bone were irrigated with copious amounts of normal saline using pulsatile lavage and then suctioned dry. Polymethylmethacrylate cement with gentamicin was prepared in the usual fashion using a vacuum mixer. Cement was applied to the cut surface of the proximal tibia as well as along the undersurface of a size 6 rotating platform tibial component. Tibial component was positioned and impacted into place. Excess cement was removed using Personal assistant. Cement was then applied to the cut surfaces of the femur as well as along the posterior flanges of the size 6 femoral component. The femoral component was positioned and impacted into place. Excess cement was removed using Personal assistant. A 5 mm polyethylene trial was inserted and the knee was brought into full extension with steady axial compression applied. Finally, cement was applied to the backside of a 35 mm medialized dome patella and the patellar component was positioned and patellar clamp applied. Excess cement was removed using Personal assistant. After adequate curing of  the cement, the tourniquet was deflated after a total tourniquet time of 82 minutes. Hemostasis was achieved using electrocautery. The knee was irrigated with copious amounts of normal saline using pulsatile lavage followed by 450 ml of Surgiphor and then suctioned dry. 20 mL of 1.3% Exparel and 60 mL of 0.25% Marcaine in 40 mL of normal saline was injected along the posterior capsule, medial and lateral gutters, and along the arthrotomy site. A 5 mm stabilized rotating platform polyethylene insert was inserted and the knee was placed through a range of motion with excellent mediolateral soft tissue balancing appreciated and excellent patellar tracking noted. 2 medium drains were placed in the wound bed and brought out through separate stab incisions. The medial parapatellar portion of the incision was reapproximated using interrupted sutures of #1 Vicryl. Subcutaneous tissue was approximated in layers using first #0 Vicryl followed #2-0 Vicryl. The skin was approximated with skin staples. A sterile dressing was applied.  The patient tolerated the procedure well and was transported to the recovery room in stable condition.    Jun Rightmyer P. Angie Fava., M.D.

## 2023-03-02 NOTE — Progress Notes (Signed)
Patient is not able to walk the distance required to go the bathroom, or he/she is unable to safely negotiate stairs required to access the bathroom.  A 3in1 BSC will alleviate this problem   Verenice Westrich P. Maikayla Beggs, Jr. M.D.  

## 2023-03-02 NOTE — H&P (Signed)
ORTHOPAEDIC HISTORY & PHYSICAL Leslie Turner, Leslie Mings., MD - 02/06/2023 3:30 PM EDT Formatting of this note is different from the original. Images from the original note were not included. Chief Complaint: Chief Complaint Patient presents with Left Knee - Pain Right Knee - Pain  Reason for Visit: The patient is a 67 y.o. female who presents today with her husband for reevaluation of both knees. She has a long history of bilateral knee pain with the left knee more symptomatic. She localizes most of the pain along the medial aspect of the knees. She reports some swelling, some locking, and some giving way of the knees. The pain is aggravated by any weight bearing, standing, and walking. The knee pain limits the patient's ability to ambulate long distances. The patient has not appreciated any significant improvement despite NSAIDs, knee sleeve, intraarticular corticosteroid injections, viscosupplementation, and activity modification. She is not using any ambulatory aids. The patient states that the knee pain has progressed to the point that it is significantly interfering with her activities of daily living.  Medications: Current Outpatient Medications Medication Sig Dispense Refill atorvastatin (LIPITOR) 40 MG tablet Take 40 mg by mouth once daily cholecalciferol (CHOLECALCIFEROL) 1,000 unit tablet Take 1,000 Units by mouth once daily hydroCHLOROthiazide (HYDRODIURIL) 25 MG tablet Take 12.5 mg by mouth once daily hydrOXYzine (ATARAX) 25 MG tablet Take 25 mg by mouth 2 (two) times daily as needed for Itching levothyroxine (SYNTHROID, LEVOTHROID) 112 MCG tablet Take 112 mcg by mouth once daily losartan (COZAAR) 50 MG tablet Take 50 mg by mouth once daily meloxicam (MOBIC) 15 MG tablet Take 15 mg by mouth once daily multivit-min-iron-FA-vit K-lut (CENTRUM SILVER WOMEN) 8 mg iron-400 mcg-50 mcg Tab Take 1 tablet by mouth once daily sertraline (ZOLOFT) 100 MG tablet Take 50 mg by mouth once  daily  No current facility-administered medications for this visit.  Allergies: No Known Allergies  Past Medical History: Past Medical History: Diagnosis Date Hyperlipidemia Hypertension  Past Surgical History: Past Surgical History: Procedure Laterality Date APPENDECTOMY  Social History: Social History  Socioeconomic History Marital status: Married Tobacco Use Smoking status: Former Smokeless tobacco: Never Substance and Sexual Activity Alcohol use: No Drug use: No Sexual activity: Defer  Family History: History reviewed. No pertinent family history.  Review of Systems: A comprehensive 14 point ROS was performed, reviewed, and the pertinent orthopaedic findings are documented in the HPI.  Exam BP 122/86  Ht 163.8 cm (5' 4.5")  Wt 90.3 kg (199 lb)  BMI 33.63 kg/m  General: Well-developed, well-nourished female seen in no acute distress. Antalgic gait. Varus thrust to the both knees.  HEENT: Atraumatic, normocephalic. Pupils are equal and reactive to light. Extraocular motion is intact. Sclera are clear. Oropharynx is clear with moist mucosa.  Neck: Supple, nontender, and with good ROM. No thyromegaly, adenopathy, JVD, or carotid bruits.  Lungs: Clear to auscultation bilaterally.  Cardiovascular: Regular rate and rhythm. Normal S1, S2. No murmur . No appreciable gallops or rubs. Peripheral pulses are palpable. No lower extremity edema. Homan`s test is negative.  Abdomen: Soft, nontender, nondistended. Bowel sounds are present.  Extremities: Good strength, stability, and range of motion of the upper extremities. Good range of motion of the hips and ankles.  Right Knee: Soft tissue swelling: mild Effusion: minimal Erythema: none Crepitance: mild Tenderness: medial Alignment: relative varus Mediolateral laxity: medial pseudolaxity Posterior sag: negative Patellar tracking: Good tracking without evidence of subluxation or tilt Atrophy: No  significant atrophy. Quadriceps tone was fair to good. Range of  motion: 0/3/120 degrees  Left Knee: Soft tissue swelling: mild Effusion: minimal Erythema: none Crepitance: mild Tenderness: medial Alignment: relative varus Mediolateral laxity: medial pseudolaxity Posterior sag: negative Patellar tracking: Good tracking without evidence of subluxation or tilt Atrophy: No significant atrophy. Quadriceps tone was fair to good. Range of motion: 0/0/115 degrees  Neurologic: Awake, alert, and oriented. Sensory function is intact to pinprick and light touch. Motor strength is judged to be 5/5. Motor coordination is within normal limits. No apparent clonus. No tremor.  X-rays: I reviewed the bilateral knee radiographs that were performed at [facility] on 12/18/2022. There is narrowing of the medial cartilage space bilaterally with bone-on-bone articulation and associated varus alignment. Osteophyte formation is noted. Subchondral sclerosis is noted. No evidence of fracture or dislocation.  Impression: Degenerative arthrosis of both knees, left more symptomatic than right  Plan: The findings were discussed in detail with the patient. The patient was given informational material on total knee replacement. Conservative treatment options were reviewed with the patient. We discussed the risks and benefits of surgical intervention. The usual perioperative course was also discussed in detail. The patient expressed understanding of the risks and benefits of surgical intervention and would like to proceed with plans for left total knee arthroplasty.  I spent a total of 45 minutes in both face-to-face and non-face-to-face activities, excluding procedures performed, for this visit on the date of this encounter.  MEDICAL CLEARANCE: Per anesthesiology. ACTIVITY: As tolerated. WORK STATUS: Not applicable. THERAPY: Preoperative physical therapy evaluation. MEDICATIONS: Requested  Prescriptions  No prescriptions requested or ordered in this encounter  FOLLOW-UP: Return for preop History & Physical pending surgery date.  Emberlynn Riggan P. Angie Fava., M.D.  This note was generated in part with voice recognition software and I apologize for any typographical errors that were not detected and corrected.  Electronically signed by Shari Heritage., MD at 02/10/2023 11:58 AM EDT

## 2023-03-02 NOTE — Transfer of Care (Addendum)
Immediate Anesthesia Transfer of Care Note  Patient: Leslie Turner  Procedure(s) Performed: COMPUTER ASSISTED TOTAL KNEE ARTHROPLASTY - RNFA (Left: Knee)  Patient Location: PACU  Anesthesia Type:Spinal  Level of Consciousness: drowsy  Airway & Oxygen Therapy: Patient Spontanous Breathing and Patient connected to face mask oxygen  Post-op Assessment: Report given to RN and Post -op Vital signs reviewed and stable  Post vital signs: Reviewed and stable  Last Vitals:  Vitals Value Taken Time  BP 101/51 03/02/23 1520  Temp 36.4 C 03/02/23 1520  Pulse 59 03/02/23 1528  Resp 24 03/02/23 1528  SpO2 98 % 03/02/23 1528  Vitals shown include unvalidated device data.  Last Pain:  Vitals:   03/02/23 0945  TempSrc: Temporal  PainSc: 0-No pain      Patients Stated Pain Goal: 0 (03/02/23 0945)  Complications: No notable events documented.

## 2023-03-02 NOTE — Progress Notes (Signed)
PT Cancellation Note  Patient Details Name: Leslie Turner MRN: [id number] DOB: December 21, 1955   Cancelled Treatment:    Reason Eval/Treat Not Completed: Patient not medically ready to participate with PT services secondary to minimal sensation to light touch and strength in her LEs. Will attempt to see pt at a future date/time as medically appropriate.     Ovidio Hanger PT, DPT 03/02/23, 4:27 PM

## 2023-03-02 NOTE — Discharge Summary (Signed)
Physician Discharge Summary  Patient ID: Leslie Turner MRN: [id number] DOB/AGE: 1956-01-18 67 y.o.  Admit date: 03/02/2023 Discharge date: 03/03/2023  Admission Diagnoses:  Total knee replacement status [Z96.659]   Discharge Diagnoses: Patient Active Problem List   Diagnosis Date Noted   Total knee replacement status 03/02/2023   Proteinuria 02/08/2022   Osteopenia of neck of right femur 01/23/2022   Aortic atherosclerosis (HCC) 11/05/2021   Left sciatic nerve pain 11/02/2021   Essential hypertension, benign 02/19/2018   Primary osteoarthritis of both knees 05/01/2016   Chronic depression 08/06/2015   Generalized anxiety disorder 08/06/2015   Hyperlipidemia 08/06/2015   Hashimoto's thyroiditis 08/06/2015   Vitamin D deficiency 08/06/2015    Past Medical History:  Diagnosis Date   Anxiety    Aortic atherosclerosis (HCC)    Carotid stenosis    Depression    Dyspnea    GERD (gastroesophageal reflux disease)    Hyperlipidemia    Hypertension    Hypothyroidism    Metabolic syndrome    Microscopic hematuria    Pain in knee    Primary osteoarthritis of left knee    Thyroid disease    hypothyroid   Vitamin D deficiency      Transfusion: none   Consultants (if any):   Discharged Condition: Improved  Hospital Course: Leslie Turner is an 67 y.o. female who was admitted 03/02/2023 with a diagnosis of Total knee replacement status and went to the operating room on 03/02/2023 and underwent the above named procedures.    Surgeries: Procedure(s): COMPUTER ASSISTED TOTAL KNEE ARTHROPLASTY - RNFA on 03/02/2023 Patient tolerated the surgery well. Taken to PACU where she was stabilized and then transferred to the orthopedic floor.  Started on Lovenox 30 mg q 24 hrs. TEDs and SCDs applied bilaterally. Heels elevated on bed. No evidence of DVT. Negative Homan. Physical therapy started on day #1 for gait training and transfer. OT started day #1 for ADL and assisted  devices.  Patient's IV and hemovac was d/c on day #1. Patient was able to safely and independently complete all PT goals. PT recommending discharge to home.    On post op day #1 patient was stable and ready for discharge to home with HHPT.  Implants: DePuy Attune size 6 posterior stabilized femoral component (cemented), size 6 rotating platform tibial component (cemented), 35 mm medialized dome patella (cemented), and a 5 mm stabilized rotating platform polyethylene insert.   She was given perioperative antibiotics:  Anti-infectives (From admission, onward)    Start     Dose/Rate Route Frequency Ordered Stop   03/02/23 1800  ceFAZolin (ANCEF) IVPB 2g/100 mL premix        2 g 200 mL/hr over 30 Minutes Intravenous Every 6 hours 03/02/23 1607 03/03/23 0559   03/02/23 0945  ceFAZolin (ANCEF) IVPB 2g/100 mL premix  Status:  Discontinued        2 g 200 mL/hr over 30 Minutes Intravenous Every 6 hours 03/02/23 0930 03/02/23 1607   03/02/23 0600  ceFAZolin (ANCEF) IVPB 2g/100 mL premix        2 g 200 mL/hr over 30 Minutes Intravenous On call to O.R. 03/02/23 4098 03/02/23 1207     .  She was given sequential compression devices, early ambulation, and Lovneox TEDs for DVT prophylaxis.  She benefited maximally from the hospital stay and there were no complications.    Recent vital signs:  Vitals:   03/02/23 1639 03/02/23 2007  BP: (!) 152/66 131/62  Pulse: (!) 52  60  Resp:  18  Temp: 97.6 F (36.4 C) 98.1 F (36.7 C)  SpO2: 100% 97%    Recent laboratory studies:  Lab Results  Component Value Date   HGB 13.9 03/02/2023   HGB 13.4 02/19/2023   HGB 13.7 02/08/2022   Lab Results  Component Value Date   WBC 8.1 03/02/2023   PLT 254 03/02/2023   No results found for: "INR" Lab Results  Component Value Date   NA 136 03/02/2023   K 2.9 (L) 03/02/2023   CL 103 03/02/2023   CO2 24 03/02/2023   BUN 19 03/02/2023   CREATININE 0.94 03/02/2023   GLUCOSE 102 (H) 03/02/2023     Discharge Medications:   Allergies as of 03/02/2023   No Known Allergies      Medication List     STOP taking these medications    meloxicam 15 MG tablet Commonly known as: MOBIC       TAKE these medications    acetaminophen 500 MG tablet Commonly known as: TYLENOL Take 2 tablets (1,000 mg total) by mouth every 6 (six) hours as needed for mild pain or moderate pain (pain score 1-3 or temp > 100.5). Start taking on: Mar 03, 2023   atorvastatin 40 MG tablet Commonly known as: LIPITOR Take 1 tablet (40 mg total) by mouth daily.   celecoxib 200 MG capsule Commonly known as: CELEBREX Take 1 capsule (200 mg total) by mouth 2 (two) times daily for 14 days. Start taking on: Mar 03, 2023   CENTRUM SILVER 50+WOMEN PO Take 1 tablet by mouth daily.   cholecalciferol 1000 units tablet Commonly known as: VITAMIN D Take 1,000 Units by mouth daily.   enoxaparin 40 MG/0.4ML injection Commonly known as: LOVENOX Inject 0.4 mLs (40 mg total) into the skin daily for 14 days.   hydrochlorothiazide 25 MG tablet Commonly known as: HYDRODIURIL Take 0.5 tablets (12.5 mg total) by mouth daily.   levothyroxine 112 MCG tablet Commonly known as: SYNTHROID Take 1 tablet (112 mcg total) by mouth daily before breakfast.   losartan 50 MG tablet Commonly known as: COZAAR Take 1 tablet (50 mg total) by mouth daily.   ondansetron 4 MG tablet Commonly known as: ZOFRAN Take 1 tablet (4 mg total) by mouth every 6 (six) hours as needed for nausea.   oxyCODONE 5 MG immediate release tablet Commonly known as: Oxy IR/ROXICODONE Take 1 tablet (5 mg total) by mouth every 4 (four) hours as needed for moderate pain (pain score 4-6).   senna-docusate 8.6-50 MG tablet Commonly known as: Senokot-S Take 1 tablet by mouth 2 (two) times daily. Start taking on: Mar 03, 2023   sertraline 100 MG tablet Commonly known as: ZOLOFT Take 0.5 tablets (50 mg total) by mouth daily.   traMADol 50 MG  tablet Commonly known as: ULTRAM Take 1-2 tablets (50-100 mg total) by mouth every 4 (four) hours as needed for moderate pain.               Durable Medical Equipment  (From admission, onward)           Start     Ordered   03/02/23 0931  DME Walker rolling  Once       Question:  Patient needs a walker to treat with the following condition  Answer:  Total knee replacement status   03/02/23 0930   03/02/23 0931  DME Bedside commode  Once       Comments: Patient is not able  to walk the distance required to go the bathroom, or he/she is unable to safely negotiate stairs required to access the bathroom.  A 3in1 BSC will alleviate this problem  Question:  Patient needs a bedside commode to treat with the following condition  Answer:  Total knee replacement status   03/02/23 0930            Diagnostic Studies: DG Knee Left Port  Result Date: 03/02/2023 CLINICAL DATA:  Total knee replacement EXAM: PORTABLE LEFT KNEE - 1-2 VIEW COMPARISON:  None Available. FINDINGS: There is a new left knee total arthroplasty in anatomic alignment. Anterior soft tissue swelling, skin staples and surgical drain are present compatible with recent surgery. Fractures are seen. IMPRESSION: New left knee total arthroplasty in anatomic alignment. Electronically Signed   By: Darliss Cheney M.D.   On: 03/02/2023 15:52    Disposition:      Follow-up Information     Tera Partridge, PA Follow up on 03/19/2023.   Specialty: Physician Assistant Why: at 1:15pm Contact information: [address] [place] [address] [phone]         Donato Heinz, MD Follow up on 04/12/2023.   Specialty: Orthopedic Surgery Why: at 2:45pm Contact information: [address] [facility] [place] [address] [phone]                  Signed: Patience Musca 03/02/2023, 11:47 PM

## 2023-03-02 NOTE — Anesthesia Preprocedure Evaluation (Addendum)
Anesthesia Evaluation  Patient identified by MRN, date of birth, ID band Patient awake    Reviewed: Allergy & Precautions, NPO status , Patient's Chart, lab work & pertinent test results  History of Anesthesia Complications Negative for: history of anesthetic complications  Airway Mallampati: III  TM Distance: >3 FB Neck ROM: full  Mouth opening: Limited Mouth Opening  Dental no notable dental hx.    Pulmonary former smoker   Pulmonary exam normal        Cardiovascular hypertension, On Medications + Peripheral Vascular Disease  Normal cardiovascular exam     Neuro/Psych  PSYCHIATRIC DISORDERS Anxiety Depression     Neuromuscular disease    GI/Hepatic Neg liver ROS,GERD  Medicated,,  Endo/Other  Hypothyroidism    Renal/GU      Musculoskeletal  (+) Arthritis , Osteoarthritis,    Abdominal   Peds  Hematology negative hematology ROS (+)   Anesthesia Other Findings Past Medical History: No date: Anxiety No date: Aortic atherosclerosis (HCC) No date: Carotid stenosis No date: Depression No date: Dyspnea No date: GERD (gastroesophageal reflux disease) No date: Hyperlipidemia No date: Hypertension No date: Hypothyroidism No date: Metabolic syndrome No date: Microscopic hematuria No date: Pain in knee No date: Primary osteoarthritis of left knee No date: Thyroid disease     Comment:  hypothyroid No date: Vitamin D deficiency  Past Surgical History: No date: APPENDECTOMY 06/15/2009: ENDOMETRIAL BIOPSY     Comment:  disordered prolif. phase No date: FOOT NEUROMA SURGERY; Left  BMI    Body Mass Index: 33.98 kg/m      Reproductive/Obstetrics negative OB ROS                             Anesthesia Physical Anesthesia Plan  ASA: 2  Anesthesia Plan: Spinal   Post-op Pain Management: Celebrex PO (pre-op)* and Gabapentin PO (pre-op)*   Induction:   PONV Risk Score and Plan:  Propofol infusion and TIVA  Airway Management Planned: Natural Airway and Nasal Cannula  Additional Equipment:   Intra-op Plan:   Post-operative Plan:   Informed Consent: I have reviewed the patients History and Physical, chart, labs and discussed the procedure including the risks, benefits and alternatives for the proposed anesthesia with the patient or authorized representative who has indicated his/her understanding and acceptance.     Dental Advisory Given  Plan Discussed with: Anesthesiologist, CRNA and Surgeon  Anesthesia Plan Comments: (Patient reports no bleeding problems and no anticoagulant use.  Plan for spinal with backup GA  Patient consented for risks of anesthesia including but not limited to:  - adverse reactions to medications - damage to eyes, teeth, lips or other oral mucosa - nerve damage due to positioning  - risk of bleeding, infection and or nerve damage from spinal that could lead to paralysis - risk of headache or failed spinal - damage to teeth, lips or other oral mucosa - sore throat or hoarseness - damage to heart, brain, nerves, lungs, other parts of body or loss of life  Patient voiced understanding.)        Anesthesia Quick Evaluation

## 2023-03-03 DIAGNOSIS — M1712 Unilateral primary osteoarthritis, left knee: Secondary | ICD-10-CM | POA: Diagnosis not present

## 2023-03-03 NOTE — Plan of Care (Signed)
  Problem: Education: Goal: Knowledge of the prescribed therapeutic regimen will improve Outcome: Adequate for Discharge Goal: Individualized Educational Video(s) Outcome: Adequate for Discharge   Problem: Activity: Goal: Ability to avoid complications of mobility impairment will improve Outcome: Adequate for Discharge Goal: Range of joint motion will improve Outcome: Adequate for Discharge   Problem: Clinical Measurements: Goal: Postoperative complications will be avoided or minimized Outcome: Adequate for Discharge   Problem: Pain Management: Goal: Pain level will decrease with appropriate interventions Outcome: Adequate for Discharge   Problem: Skin Integrity: Goal: Will show signs of wound healing Outcome: Adequate for Discharge   Problem: Education: Goal: Knowledge of General Education information will improve Description: Including pain rating scale, medication(s)/side effects and non-pharmacologic comfort measures Outcome: Adequate for Discharge   Problem: Health Behavior/Discharge Planning: Goal: Ability to manage health-related needs will improve Outcome: Adequate for Discharge   Problem: Clinical Measurements: Goal: Ability to maintain clinical measurements within normal limits will improve Outcome: Adequate for Discharge Goal: Will remain free from infection Outcome: Adequate for Discharge Goal: Diagnostic test results will improve Outcome: Adequate for Discharge Goal: Respiratory complications will improve Outcome: Adequate for Discharge Goal: Cardiovascular complication will be avoided Outcome: Adequate for Discharge   Problem: Activity: Goal: Risk for activity intolerance will decrease Outcome: Adequate for Discharge   Problem: Nutrition: Goal: Adequate nutrition will be maintained Outcome: Adequate for Discharge   Problem: Coping: Goal: Level of anxiety will decrease Outcome: Adequate for Discharge   Problem: Elimination: Goal: Will not  experience complications related to bowel motility Outcome: Adequate for Discharge Goal: Will not experience complications related to urinary retention Outcome: Adequate for Discharge   Problem: Pain Managment: Goal: General experience of comfort will improve Outcome: Adequate for Discharge   Problem: Safety: Goal: Ability to remain free from injury will improve Outcome: Adequate for Discharge   Problem: Skin Integrity: Goal: Risk for impaired skin integrity will decrease Outcome: Adequate for Discharge   Problem: Acute Rehab PT Goals(only PT should resolve) Goal: Pt Will Ambulate Outcome: Adequate for Discharge Goal: Pt Will Go Up/Down Stairs Outcome: Adequate for Discharge Goal: Pt/caregiver will Perform Home Exercise Program Outcome: Adequate for Discharge   

## 2023-03-03 NOTE — Anesthesia Postprocedure Evaluation (Signed)
Anesthesia Post Note  Patient: Leslie Turner  Procedure(s) Performed: COMPUTER ASSISTED TOTAL KNEE ARTHROPLASTY - RNFA (Left: Knee)  Patient location during evaluation: Nursing Unit Anesthesia Type: Spinal Level of consciousness: oriented and awake and alert Pain management: pain level controlled Vital Signs Assessment: post-procedure vital signs reviewed and stable Respiratory status: spontaneous breathing and respiratory function stable Cardiovascular status: blood pressure returned to baseline and stable Postop Assessment: no headache, no backache, no apparent nausea or vomiting and patient able to bend at knees Anesthetic complications: no   No notable events documented.   Last Vitals:  Vitals:   03/03/23 0428 03/03/23 0805  BP: 138/67 113/61  Pulse: (!) 50 (!) 54  Resp: 18 18  Temp: 36.6 C   SpO2: 100% 96%    Last Pain:  Vitals:   03/03/23 0900  TempSrc:   PainSc: 0-No pain                 Lenard Simmer

## 2023-03-03 NOTE — Evaluation (Signed)
Physical Therapy Evaluation Patient Details Name: Leslie Turner MRN: [id number] DOB: 1956/10/06 Today's Date: 03/03/2023  History of Present Illness  Pt is a 67 yo female diagnosed with degenerative arthrosis of the left knee with surgical intervention on 03/02/2023, s/p left total knee replacement with Dr. Ernest Pine.  Clinical Impression  67 yo Female s/p LLE TKA, reports feeling well with no pain during PT evaluation. She was standing edge of bed with OT at start of evaluation. OT had just finished working with her. Patient requested to continue walking with PT to complete evaluation. She walked down hallway to PT office to assess stair negotiation (approximately 140 feet). Patient initially ambulated with 3 point gait pattern, picking up RW. However with increased distance was able to progress to 2 point gait pattern. Patient was able to manage RW well and demonstrates good safety awareness. She does ambulate with slower gait speed of 1.3 feet/sec indicating home ambulator with increased risk for falls. Patient educated in stair negotiation safety as she has 3 steps to enter home. Patient ascend/descend 4 steps sideways, one step at a time with BUE on rail with supervision. She was able to teach back proper sequencing of leading with strong leg and descending with weaker leg first. Patient's LLE knee AROM in sitting 6-86 degrees. She is Mod I for bed mobility and transfers. Patient was provided with home exercise program for knee ROM/strengthening. PT educated patient in proper positioning and exercise technique. She verbalized understanding. Patient would benefit from RW and bedside commode for home use upon discharge. She would benefit from additional skilled PT Intervention to improve ROM, strength and mobility;      Recommendations for follow up therapy are one component of a multi-disciplinary discharge planning process, led by the attending physician.  Recommendations may be updated based on patient  status, additional functional criteria and insurance authorization.  Follow Up Recommendations       Assistance Recommended at Discharge Intermittent Supervision/Assistance  Patient can return home with the following  A little help with walking and/or transfers;Assistance with cooking/housework;Assist for transportation;Help with stairs or ramp for entrance    Equipment Recommendations Rolling walker (2 wheels);BSC/3in1  Recommendations for Other Services       Functional Status Assessment Patient has had a recent decline in their functional status and demonstrates the ability to make significant improvements in function in a reasonable and predictable amount of time.     Precautions / Restrictions Precautions Precautions: Fall Restrictions Weight Bearing Restrictions: Yes LLE Weight Bearing: Weight bearing as tolerated      Mobility  Bed Mobility Overal bed mobility: Modified Independent                  Transfers Overall transfer level: Modified independent                 General transfer comment: Occasionally requires verbal cues for hand placement for safety;    Ambulation/Gait Ambulation/Gait assistance: Modified independent (Device/Increase time), Min guard Gait Distance (Feet): 140 Feet (x2 reps) Assistive device: Rolling walker (2 wheels) Gait Pattern/deviations: Step-through pattern, Decreased step length - right Gait velocity: 1.3 feet/sec Gait velocity interpretation: <1.31 ft/sec, indicative of household ambulator   General Gait Details: Pt initially ambulated with 3 point gait pattern, picking up RW and taking a short step on RLE, however with increased time, was able to progress to 2 point pattern with increased weight shift to LLE and better step length on RLE. Pt exhibits good motor control with  no instability; She initially required CGA for safety but able to progress to mod I for ambulation;  Stairs Stairs: Yes Stairs assistance:  Supervision Stair Management: One rail Left, Step to pattern, Sideways Number of Stairs: 4 General stair comments: Pt educated in proper mechanics of ascend with strong leg, descend with weaker leg;  Wheelchair Mobility    Modified Rankin (Stroke Patients Only)       Balance Overall balance assessment: Mild deficits observed, not formally tested                                           Pertinent Vitals/Pain Pain Assessment Pain Assessment: No/denies pain    Home Living Family/patient expects to be discharged to:: Private residence Living Arrangements: Spouse/significant other Available Help at Discharge: Family Type of Home: House Home Access: Stairs to enter Entrance Stairs-Rails: Left Entrance Stairs-Number of Steps: 3   Home Layout: One level Home Equipment: None      Prior Function Prior Level of Function : Independent/Modified Independent             Mobility Comments: Pt reports she was able to ambulate prior to admission without adaptive equipment ADLs Comments: Pt reports independence in self care and IADL tasks prior to admission     Hand Dominance   Dominant Hand: Right    Extremity/Trunk Assessment   Upper Extremity Assessment Upper Extremity Assessment: Generalized weakness    Lower Extremity Assessment Lower Extremity Assessment: LLE deficits/detail;RLE deficits/detail RLE Deficits / Details: WFL RLE Sensation: WNL RLE Coordination: WNL LLE Deficits / Details: LLE knee AROM in sitting: 6-86 degrees; strength not formally assessed due to post surgical, able to complete 10 SLR    Cervical / Trunk Assessment Cervical / Trunk Assessment: Normal  Communication   Communication: No difficulties  Cognition Arousal/Alertness: Awake/alert Behavior During Therapy: WFL for tasks assessed/performed Overall Cognitive Status: Within Functional Limits for tasks assessed                                 General  Comments: Alert and oriented x4        General Comments General comments (skin integrity, edema, etc.): Pt has surgical incision on LLE anterior knee which was covered; Mild swelling noted in LLE;    Exercises Total Joint Exercises Heel Slides: AROM, Left, 5 reps Straight Leg Raises: AROM, Left, 10 reps Goniometric ROM: When sitting, LLE knee AROM: 6-86 degrees Other Exercises Other Exercises: PT provided patient with handout of HEP with instruction and review of quad set, SAQ, SLR, heel slides etc. Pt verbalized understanding. (x8 min) Other Exercises: Also educated patient about using cryocuff and positioning when home. PT educated patient about RW and getting skis (easy sliders) to help walker move easier on floor.   Assessment/Plan    PT Assessment Patient needs continued PT services  PT Problem List Decreased strength;Decreased balance;Pain;Decreased range of motion;Decreased mobility;Decreased activity tolerance       PT Treatment Interventions DME instruction;Functional mobility training;Balance training;Patient/family education;Gait training;Therapeutic activities;Stair training;Therapeutic exercise    PT Goals (Current goals can be found in the Care Plan section)  Acute Rehab PT Goals Patient Stated Goal: "I am ready to go home." PT Goal Formulation: With patient Time For Goal Achievement: 03/17/23 Potential to Achieve Goals: Good    Frequency BID  Co-evaluation               AM-PAC PT "6 Clicks" Mobility  Outcome Measure Help needed turning from your back to your side while in a flat bed without using bedrails?: None Help needed moving from lying on your back to sitting on the side of a flat bed without using bedrails?: None Help needed moving to and from a bed to a chair (including a wheelchair)?: A Little Help needed standing up from a chair using your arms (e.g., wheelchair or bedside chair)?: None Help needed to walk in hospital room?: None Help  needed climbing 3-5 steps with a railing? : A Little 6 Click Score: 22    End of Session Equipment Utilized During Treatment: Gait belt Activity Tolerance: Patient tolerated treatment well Patient left: in bed;with call bell/phone within reach;with bed alarm set Nurse Communication: Mobility status PT Visit Diagnosis: Muscle weakness (generalized) (M62.81);Difficulty in walking, not elsewhere classified (R26.2);Pain Pain - Right/Left: Left Pain - part of body: Knee    Time: 1010-1050 PT Time Calculation (min) (ACUTE ONLY): 40 min   Charges:   PT Evaluation $PT Eval Low Complexity: 1 Low PT Treatments $Therapeutic Exercise: 8-22 mins         Cherissa Hook PT, DPT 03/03/2023, 12:53 PM

## 2023-03-03 NOTE — Progress Notes (Signed)
   Subjective: 1 Day Post-Op Procedure(s) (LRB): COMPUTER ASSISTED TOTAL KNEE ARTHROPLASTY - RNFA (Left) Patient reports pain as mild.   Patient is well, and has had no acute complaints or problems Denies any CP, SOB, ABD pain. We will continue therapy today.  Plan is to go Home after hospital stay.  Objective: Vital signs in last 24 hours: Temp:  [97.1 F (36.2 C)-98.1 F (36.7 C)] 97.8 F (36.6 C) (05/04 0428) Pulse Rate:  [50-64] 54 (05/04 0805) Resp:  [12-19] 18 (05/04 0805) BP: (98-154)/(51-82) 113/61 (05/04 0805) SpO2:  [96 %-100 %] 96 % (05/04 0805) Weight:  [91.2 kg] 91.2 kg (05/03 1639)  Intake/Output from previous day: 05/03 0701 - 05/04 0700 In: 2052.9 [P.O.:240; I.V.:1412.9; IV Piggyback:400] Out: 1005 [Urine:800; Drains:155; Blood:50] Intake/Output this shift: No intake/output data recorded.  Recent Labs    03/02/23 0959  HGB 13.9   Recent Labs    03/02/23 0959  WBC 8.1  RBC 4.57  HCT 40.7  PLT 254   Recent Labs    03/02/23 0959  NA 136  K 2.9*  CL 103  CO2 24  BUN 19  CREATININE 0.94  GLUCOSE 102*  CALCIUM 9.0   No results for input(s): "LABPT", "INR" in the last 72 hours.  EXAM General - Patient is Alert, Appropriate, and Oriented Extremity - Neurovascular intact Sensation intact distally Intact pulses distally Dorsiflexion/Plantar flexion intact No cellulitis present Compartment soft Dressing - dressing C/D/I and no drainage Motor Function - intact, moving foot and toes well on exam.    Past Medical History:  Diagnosis Date   Anxiety    Aortic atherosclerosis (HCC)    Carotid stenosis    Depression    Dyspnea    GERD (gastroesophageal reflux disease)    Hyperlipidemia    Hypertension    Hypothyroidism    Metabolic syndrome    Microscopic hematuria    Pain in knee    Primary osteoarthritis of left knee    Thyroid disease    hypothyroid   Vitamin D deficiency     Assessment/Plan:   1 Day Post-Op Procedure(s)  (LRB): COMPUTER ASSISTED TOTAL KNEE ARTHROPLASTY - RNFA (Left) Principal Problem:   Total knee replacement status  Estimated body mass index is 34.51 kg/m as calculated from the following:   Height as of this encounter: 5\' 4"  (1.626 m).   Weight as of this encounter: 91.2 kg. Advance diet Up with therapy Pain well-controlled Vital signs are stable Patient ambulatory in the room this morning with no complaints.  Anticipate discharge to home with home health PT today.  DVT Prophylaxis - Lovenox, TED hose, and scds Weight-Bearing as tolerated to left leg   T. Cranston Neighbor, PA-C [facility] Orthopa[facility] 03/03/2023, 9:45 AM

## 2023-03-03 NOTE — TOC Transition Note (Signed)
Transition of Care [facility]) - CM/SW Discharge Note   Patient Details  Name: Leslie Turner MRN: [id number] Date of Birth: 1956-06-27  Transition of Care [facility]) CM/SW Contact:  Darolyn Rua, LCSW Phone Number: 03/03/2023, 11:30 AM   Clinical Narrative:     Patient to discharge today with [facility] Health services, agreeable to 3in1 and RW to be delivered to room via [employer]. No further discharge needs identified.   Final next level of care: Home w Home Health Services Barriers to Discharge: No Barriers Identified   Patient Goals and CMS Choice CMS Medicare.gov Compare Post Acute Care list provided to:: Patient Choice offered to / list presented to : Patient  Discharge Placement                         Discharge Plan and Services Additional resources added to the After Visit Summary for                  DME Arranged: 3-N-1, Walker rolling DME Agency: [employer] Date DME Agency Contacted: 03/03/23   Representative spoke with at DME Agency: Leavy Cella HH Arranged: PT HH Agency: [facility]        Social Determinants of Health (SDOH) Interventions SDOH Screenings   Food Insecurity: No Food Insecurity (08/31/2022)  Housing: Low Risk  (08/31/2022)  Transportation Needs: No Transportation Needs (08/31/2022)  Utilities: Not At Risk (02/18/2023)  Alcohol Screen: Low Risk  (08/31/2022)  Depression (PHQ2-9): Low Risk  (02/19/2023)  Financial Resource Strain: Low Risk  (08/31/2022)  Physical Activity: Insufficiently Active (08/31/2022)  Social Connections: Moderately Integrated (08/31/2022)  Stress: No Stress Concern Present (08/31/2022)  Tobacco Use: Medium Risk (03/02/2023)     Readmission Risk Interventions     No data to display

## 2023-03-03 NOTE — Evaluation (Addendum)
Occupational Therapy Evaluation Patient Details Name: Leslie Turner MRN: [id number] DOB: 12-14-1955 Today's Date: 03/03/2023   History of Present Illness Pt is a 67 yo female diagnosed with degenerative arthrosis of the left knee with surgical intervention on 03/02/2023, s/p left total knee replacement with Dr. Ernest Pine.   Clinical Impression   Pt seen for OT evaluation this date, she lives with her husband at home in a one story house with 3 steps to enter.  She has a walk in shower in built in shower seat.  She is able to demonstrate the ability to perform lower body dressing with modified independence and without the use of adaptive equipment.  She is min guard to complete toileting with elevated toilet and functional mobility.  She has no further OT needs, goals met and ready for discharge home.        Recommendations for follow up therapy are one component of a multi-disciplinary discharge planning process, led by the attending physician.  Recommendations may be updated based on patient status, additional functional criteria and insurance authorization.   Assistance Recommended at Discharge None  Patient can return home with the following Assistance with cooking/housework;Assist for transportation    Functional Status Assessment  Patient has had a recent decline in their functional status and demonstrates the ability to make significant improvements in function in a reasonable and predictable amount of time.  Equipment Recommendations  BSC/3in1    Recommendations for Other Services       Precautions / Restrictions Precautions Precautions: Fall Restrictions Weight Bearing Restrictions: Yes LLE Weight Bearing: Weight bearing as tolerated      Mobility Bed Mobility Overal bed mobility: Modified Independent                  Transfers Overall transfer level: Modified independent                        Balance Overall balance assessment: Mild deficits  observed, not formally tested                                         ADL either performed or assessed with clinical judgement   ADL Overall ADL's : Needs assistance/impaired Eating/Feeding: Independent   Grooming: Modified independent   Upper Body Bathing: Independent   Lower Body Bathing: Modified independent Lower Body Bathing Details (indicate cue type and reason): Able to reach to her feet for bathing from a seated position Upper Body Dressing : Modified independent   Lower Body Dressing: Modified independent Lower Body Dressing Details (indicate cue type and reason): Pt instructed on lower body dressing techniques and is able to perform without adaptive equipment, increased time as needed. Toilet Transfer: Lawyer and Hygiene: Min guard   Tub/ Engineer, structural: Min guard   Functional mobility during ADLs: Min guard General ADL Comments: Pt is doing well, min guard for safety with functional mobility and toileting this date and use of walker.  Has a built in shower seat at home.     Vision Baseline Vision/History: 1 Wears glasses       Perception     Praxis      Pertinent Vitals/Pain Pain Assessment Pain Assessment: No/denies pain     Hand Dominance Right   Extremity/Trunk Assessment Upper Extremity Assessment Upper Extremity Assessment: Generalized weakness   Lower Extremity Assessment  Lower Extremity Assessment: Defer to PT evaluation       Communication Communication Communication: No difficulties   Cognition Arousal/Alertness: Awake/alert Behavior During Therapy: WFL for tasks assessed/performed Overall Cognitive Status: Within Functional Limits for tasks assessed                                       General Comments       Exercises     Shoulder Instructions      Home Living Family/patient expects to be discharged to:: Private residence Living Arrangements:  Spouse/significant other Available Help at Discharge: Family Type of Home: House Home Access: Stairs to enter Secretary/administrator of Steps: 3   Home Layout: One level     Bathroom Shower/Tub: Walk-in shower;Door   [employer] Toilet: Handicapped height Bathroom Accessibility: Yes How Accessible: Accessible via walker Home Equipment: None          Prior Functioning/Environment Prior Level of Function : Independent/Modified Independent             Mobility Comments: Pt reports she was able to ambulate prior to admission without adaptive equipment ADLs Comments: Pt reports independence in self care and IADL tasks prior to admission        OT Problem List: Decreased strength;Decreased range of motion      OT Treatment/Interventions:      OT Goals(Current goals can be found in the care plan section) Acute Rehab OT Goals Patient Stated Goal: Pt would like to return home and do as much as possible for herself. OT Goal Formulation: With patient Time For Goal Achievement: 03/03/23 Potential to Achieve Goals: Good ADL Goals Pt Will Perform Lower Body Dressing: with modified independence Pt Will Transfer to Toilet: with min guard assist  OT Frequency:      Co-evaluation              AM-PAC OT "6 Clicks" Daily Activity     Outcome Measure Help from another person eating meals?: None Help from another person taking care of personal grooming?: None Help from another person toileting, which includes using toliet, bedpan, or urinal?: None Help from another person bathing (including washing, rinsing, drying)?: A Little Help from another person to put on and taking off regular upper body clothing?: None Help from another person to put on and taking off regular lower body clothing?: None 6 Click Score: 23   End of Session Equipment Utilized During Treatment: Gait belt;Rolling walker (2 wheels)  Activity Tolerance: Patient tolerated treatment well Patient left: Other  (comment) (PT in at end of session to work with patient.)  OT Visit Diagnosis: Muscle weakness (generalized) (M62.81)                Time: 1610-9604 OT Time Calculation (min): 23 min Charges:  OT General Charges $OT Visit: 1 Visit OT Evaluation $OT Eval Low Complexity: 1 Low  [name], OTR/L, CLT [name] 03/03/2023, 11:27 AM

## 2023-03-03 NOTE — Progress Notes (Signed)
CSW had ordered dme 3in1 and RW from [employer] 11:30 am, CSW called Jasmine with [employer] at 1:30 pm to inquire as to delivery ETA. She reports driver was up [place] on a different delivery and she is unsure why this discharge was not prioritized.   At this time, [employer] to deliver dme to patient's home as to not delay discharge any longer.   CSW has emailed [facility] supervisor of above incident to follow up with [employer].   Darolyn Rua, [place], MSW, [place] [phone]

## 2023-03-03 NOTE — Plan of Care (Signed)
  Problem: Activity: Goal: Ability to avoid complications of mobility impairment will improve Outcome: Progressing Goal: Range of joint motion will improve Outcome: Progressing   Problem: Pain Management: Goal: Pain level will decrease with appropriate interventions Outcome: Progressing   Problem: Safety: Goal: Ability to remain free from injury will improve Outcome: Progressing   Problem: Skin Integrity: Goal: Risk for impaired skin integrity will decrease Outcome: Progressing

## 2023-03-03 NOTE — Progress Notes (Addendum)
Discharge Note: Reviewed discharge instructions with pt. Pt verbalized understanding. Pt discharged with incentive spirometer, polar care, personal belongings, zero-bone foam, two extra honeycomb dressing, and bilat compression stockings on.  Staff wheeled pt out. Pt transported to home via family vehicle.

## 2023-03-05 ENCOUNTER — Encounter: Payer: Self-pay | Admitting: Orthopedic Surgery

## 2023-03-22 ENCOUNTER — Other Ambulatory Visit: Payer: Self-pay | Admitting: Nurse Practitioner

## 2023-03-22 NOTE — Telephone Encounter (Signed)
Requested by interface surescripts. Medication discontinued 03/03/23.  Requested Prescriptions  Refused Prescriptions Disp Refills   meloxicam (MOBIC) 15 MG tablet [Pharmacy Med Name: MELOXICAM TAB 15MG ] 90 tablet 3    Sig: TAKE 1 TABLET DAILY     Analgesics:  COX2 Inhibitors Failed - 03/22/2023  2:10 AM      Failed - Manual Review: Labs are only required if the patient has taken medication for more than 8 weeks.      Passed - HGB in normal range and within 360 days    Hemoglobin  Date Value Ref Range Status  03/02/2023 13.9 12.0 - 15.0 g/dL Final  40/98/1191 47.8 11.1 - 15.9 g/dL Final         Passed - Cr in normal range and within 360 days    Creatinine, Ser  Date Value Ref Range Status  03/02/2023 0.94 0.44 - 1.00 mg/dL Final         Passed - HCT in normal range and within 360 days    HCT  Date Value Ref Range Status  03/02/2023 40.7 36.0 - 46.0 % Final   Hematocrit  Date Value Ref Range Status  02/19/2023 41.8 34.0 - 46.6 % Final         Passed - AST in normal range and within 360 days    AST  Date Value Ref Range Status  03/02/2023 25 15 - 41 U/L Final         Passed - ALT in normal range and within 360 days    ALT  Date Value Ref Range Status  03/02/2023 17 0 - 44 U/L Final         Passed - eGFR is 30 or above and within 360 days    GFR calc Af Amer  Date Value Ref Range Status  08/10/2020 88 >59 mL/min/1.73 Final    Comment:    **[facility] currently reports eGFR in compliance with the current**   recommendations of the [employer]. [facility] will   update reporting as new guidelines are published from the NKF-ASN   Task force.    GFR, Estimated  Date Value Ref Range Status  03/02/2023 >60 >60 mL/min Final    Comment:    (NOTE) Calculated using the CKD-EPI Creatinine Equation (2021)    eGFR  Date Value Ref Range Status  02/19/2023 59 (L) >59 mL/min/1.73 Final         Passed - Patient is not pregnant      Passed - Valid encounter  within last 12 months    Recent Outpatient Visits           1 month ago Pneumococcal vaccination given   [place] [facility] [place], Corrie Dandy T, NP   5 months ago Primary osteoarthritis of both knees   [place] [facility] Lantana, Trimble T, NP   7 months ago Chronic depression   [place] [facility] Quentin, Ravenel T, NP   9 months ago Primary osteoarthritis of both knees   [place] [facility] [place], Corrie Dandy T, NP   1 year ago Aortic atherosclerosis (HCC)   [place] [facility] [place], Dorie Rank, NP       Future Appointments             In 5 months [place], Dorie Rank, NP [place]

## 2023-03-27 ENCOUNTER — Encounter: Payer: Self-pay | Admitting: Nurse Practitioner

## 2023-03-27 MED ORDER — MELOXICAM 7.5 MG PO TABS: 7.5000 mg | ORAL_TABLET | Freq: Every day | ORAL | 3 refills | Status: DC

## 2023-03-27 MED ORDER — TIZANIDINE HCL 4 MG PO TABS: 4.0000 mg | ORAL_TABLET | Freq: Four times a day (QID) | ORAL | 0 refills | Status: DC | PRN

## 2023-04-23 ENCOUNTER — Ambulatory Visit
Admission: RE | Admit: 2023-04-23 | Discharge: 2023-04-23 | Disposition: A | Discharge status: Home / Self Care | Payer: Medicare Other | Source: Ambulatory Visit | Attending: Nurse Practitioner | Admitting: Nurse Practitioner

## 2023-04-23 DIAGNOSIS — Z1231 Encounter for screening mammogram for malignant neoplasm of breast: Secondary | ICD-10-CM | POA: Insufficient documentation

## 2023-04-25 ENCOUNTER — Encounter: Payer: Self-pay | Admitting: Nurse Practitioner

## 2023-04-25 NOTE — Progress Notes (Signed)
Contacted via MyChart   Normal mammogram, may repeat in one year:)

## 2023-04-25 NOTE — Telephone Encounter (Signed)
Spoke to pharmacy for patient and they stated that they just sent out her prescription today and that they do have her new prescription. Called patient back and informed her that her prescription is on it way from pharmacy. Patient verbalized understanding

## 2023-04-26 ENCOUNTER — Encounter: Payer: Self-pay | Admitting: Nurse Practitioner

## 2023-06-24 NOTE — Discharge Instructions (Addendum)
Instructions after Total Knee Replacement   James P. Angie Fava., M.D.    Dept. of Orthopaedics & Sports Medicine [facility] [address] [place], [place]  [address]  Phone: [phone]   Fax: [phone]       [email or web address]       DIET: Drink plenty of non-alcoholic fluids. Resume your normal diet. Include foods high in fiber.  ACTIVITY:  You may use crutches or a walker with weight-bearing as tolerated, unless instructed otherwise. You may be weaned off of the walker or crutches by your Physical Therapist.  Do NOT place pillows under the knee. Anything placed under the knee could limit your ability to straighten the knee.   Continue doing gentle exercises. Exercising will reduce the pain and swelling, increase motion, and prevent muscle weakness.   Please continue to use the TED compression stockings for 6 weeks. You may remove the stockings at night, but should reapply them in the morning. Do not drive or operate any equipment until instructed.  WOUND CARE:  Continue to use the PolarCare or ice packs periodically to reduce pain and swelling. You may bathe or shower after the staples are removed at the first office visit following surgery. The Aquacel bandage will remain in place for 7 days postoperatively.  After this time it can be changed out to a honeycomb dressing.  At home PT can help with this  MEDICATIONS: You may resume your regular medications. Please take the pain medication as prescribed on the medication. Do not take pain medication on an empty stomach. You have been given a prescription for a blood thinner: Aspirin 81 mg.  Please take this once in the morning and once at night. Do not drive or drink alcoholic beverages when taking pain medications.  CALL THE OFFICE FOR: Temperature above 101 degrees Excessive bleeding or drainage on the dressing. Excessive swelling, coldness, or paleness of the toes. Persistent nausea and  vomiting.  FOLLOW-UP:  You should have an appointment to return to the office in 10-14 days after surgery. Arrangements have been made for continuation of Physical Therapy (either home therapy or outpatient therapy).     [facility] Department Directory         [email or web address]       [email or web address]          Cardiology  Appointments: [place] - [phone]  Endocrinology  Appointments: [place] [phone] [place] - [phone]  Gastroenterology  Appointments: [place] [phone] [place] - [phone]        General Surgery   Appointments: [employer]  Internal Medicine/Family Medicine  Appointments: [facility] [place] - [phone] [place] - [phone]  Metabolic and Weigh Loss Surgery  Appointments: [facility]        Neurology  Appointments: [place] [phone] [place] - [phone]  Neurosurgery  Appointments: [place]  Obstetrics & Gynecology  Appointments: [place] [phone] [place] - [phone]        Pediatrics  Appointments: [place] [phone] [place] - [phone]  Physiatry  Appointments: [place] [phone]  Physical Therapy  Appointments: [place] - [phone]        Podiatry  Appointments: [place] [phone] [place] - [phone]  Pulmonology  Appointments: [place]  Rheumatology  Appointments: [place] [phone]        [place] Location: [facility]  [address] [place], [place]  [address]  [place] Location: [facility] [address] [place], [place]  [address]  [place] Location: [facility] [address] [place], [place]  [address]

## 2023-07-17 ENCOUNTER — Encounter: Payer: Self-pay | Admitting: Nurse Practitioner

## 2023-07-17 MED ORDER — SERTRALINE HCL 100 MG PO TABS: 50.0000 mg | ORAL_TABLET | Freq: Every day | ORAL | 0 refills | Status: DC

## 2023-07-18 ENCOUNTER — Other Ambulatory Visit: Payer: Self-pay

## 2023-07-18 ENCOUNTER — Encounter
Admission: RE | Admit: 2023-07-18 | Discharge: 2023-07-18 | Disposition: A | Discharge status: Home / Self Care | Payer: Medicare Other | Source: Ambulatory Visit | Attending: Orthopedic Surgery | Admitting: Orthopedic Surgery

## 2023-07-18 VITALS — BP 101/65 | HR 71 | Resp 16 | Ht 64.0 in | Wt 198.6 lb

## 2023-07-18 DIAGNOSIS — Z79899 Other long term (current) drug therapy: Secondary | ICD-10-CM

## 2023-07-18 DIAGNOSIS — Z01818 Encounter for other preprocedural examination: Secondary | ICD-10-CM | POA: Insufficient documentation

## 2023-07-18 DIAGNOSIS — T502X5A Adverse effect of carbonic-anhydrase inhibitors, benzothiadiazides and other diuretics, initial encounter: Secondary | ICD-10-CM

## 2023-07-18 DIAGNOSIS — Z01812 Encounter for preprocedural laboratory examination: Secondary | ICD-10-CM

## 2023-07-18 DIAGNOSIS — Z96659 Presence of unspecified artificial knee joint: Secondary | ICD-10-CM

## 2023-07-18 DIAGNOSIS — M1711 Unilateral primary osteoarthritis, right knee: Secondary | ICD-10-CM | POA: Diagnosis not present

## 2023-07-18 HISTORY — DX: Anemia, unspecified: D64.9

## 2023-07-18 LAB — COMPREHENSIVE METABOLIC PANEL WITH GFR
ALT: 23 U/L (ref 0–44)
AST: 29 U/L (ref 15–41)
Albumin: 3.8 g/dL (ref 3.5–5.0)
Alkaline Phosphatase: 85 U/L (ref 38–126)
Anion gap: 9 (ref 5–15)
BUN: 17 mg/dL (ref 8–23)
CO2: 25 mmol/L (ref 22–32)
Calcium: 9.1 mg/dL (ref 8.9–10.3)
Chloride: 104 mmol/L (ref 98–111)
Creatinine, Ser: 0.86 mg/dL (ref 0.44–1.00)
GFR, Estimated: >60 mL/min (ref >60)
Glucose, Bld: 94 mg/dL (ref 70–99)
Potassium: 2.9 mmol/L — ABNORMAL LOW (ref 3.5–5.1)
Sodium: 138 mmol/L (ref 135–145)
Total Bilirubin: 1.1 mg/dL (ref 0.3–1.2)
Total Protein: 7.2 g/dL (ref 6.5–8.1)

## 2023-07-18 LAB — CBC
HCT: 40.6 % (ref 36.0–46.0)
Hemoglobin: 13.4 g/dL (ref 12.0–15.0)
MCH: 29 pg (ref 26.0–34.0)
MCHC: 33 g/dL (ref 30.0–36.0)
MCV: 87.9 fL (ref 80.0–100.0)
Platelets: 261 10*3/uL (ref 150–400)
RBC: 4.62 MIL/uL (ref 3.87–5.11)
RDW: 14 % (ref 11.5–15.5)
WBC: 7.1 10*3/uL (ref 4.0–10.5)
nRBC: 0 % (ref 0.0–0.2)

## 2023-07-18 LAB — URINALYSIS, ROUTINE W REFLEX MICROSCOPIC
Bilirubin Urine: NEGATIVE
Glucose, UA: NEGATIVE mg/dL
Hgb urine dipstick: NEGATIVE
Ketones, ur: NEGATIVE mg/dL
Leukocytes,Ua: NEGATIVE
Nitrite: NEGATIVE
Protein, ur: NEGATIVE mg/dL
Specific Gravity, Urine: 1.019 (ref 1.005–1.030)
pH: 6 (ref 5.0–8.0)

## 2023-07-18 LAB — SURGICAL PCR SCREEN
MRSA, PCR: NEGATIVE
Staphylococcus aureus: POSITIVE — AB

## 2023-07-18 LAB — SEDIMENTATION RATE: Sed Rate: 23 mm/h (ref 0–30)

## 2023-07-18 LAB — TYPE AND SCREEN
ABO/RH(D): A POS
Antibody Screen: NEGATIVE

## 2023-07-18 LAB — C-REACTIVE PROTEIN: CRP: <0.5 mg/dL (ref <1.0)

## 2023-07-18 NOTE — Progress Notes (Signed)
  [facility] Perioperative Services: Pre-Admission/Anesthesia Testing  Abnormal Lab Notification   Date: 07/18/23  Name: Leslie Turner MRN:   [id number]  Re: Abnormal labs noted during PAT appointment   Notified:  Provider Name Provider Role Notification Mode  Francesco Sor, MD Orthopedics Routed and/or faxed via [facility]   Clinical Information and Notes:  ABNORMAL LAB VALUE(S): Lab Results  Component Value Date   K 2.9 (L) 07/18/2023   Lavonia Drafts is scheduled for an elective COMPUTER ASSISTED TOTAL KNEE ARTHROPLASTY on 07/25/2023. In review of her medication reconciliation, it is noted that the patient is taking prescribed diuretic medications (HCTZ 12.5 mg).  Please note, in efforts to promote a safe and effective anesthetic course, per current guidelines/standards set by the [facility] anesthesia team, the minimal acceptable K+ level for the patient to proceed with general anesthesia is 3.0 mmol/L. With that being said, the patient is already too low for surgery without optimization.   Abnormal result is being forwarded to primary attending surgeon for review and consideration of optimization. Order placed to have K+ rechecked on the day of her procedure to ensure correction of the noted derangement.    Quentin Mulling, MSN, APRN, FNP-C, CEN [facility]  Perioperative Services Nurse Practitioner Phone: [phone] Fax: [phone] 07/18/23 4:04 PM

## 2023-07-18 NOTE — Patient Instructions (Addendum)
Your procedure is scheduled on: 07/25/23 - Wednesday Report to the Registration Desk on the 1st floor of the [facility]. To find out your arrival time, please call [phone] between 1PM - 3PM on: 07/24/23 - Tuesday If your arrival time is 6:00 am, do not arrive before that time as the [facility] entrance doors do not open until 6:00 am.  REMEMBER: Instructions that are not followed completely may result in serious medical risk, up to and including death; or upon the discretion of your surgeon and anesthesiologist your surgery may need to be rescheduled.  Do not eat food after midnight the night before surgery.  No gum chewing or hard candies.  You may however, drink CLEAR liquids up to 2 hours before you are scheduled to arrive for your surgery. Do not drink anything within 2 hours of your scheduled arrival time.  Clear liquids include: - water  - apple juice without pulp - gatorade (not RED colors) - black coffee or tea (Do NOT add milk or creamers to the coffee or tea) Do NOT drink anything that is not on this list.  In addition, your doctor has ordered for you to drink the provided:  Ensure Pre-Surgery Clear Carbohydrate Drink  Drinking this carbohydrate drink up to two hours before surgery helps to reduce insulin resistance and improve patient outcomes. Please complete drinking 2 hours before scheduled arrival time.  One week prior to surgery: Stop taking on 07/18/23, Anti-inflammatories (NSAIDS) such as Advil, Aleve, Ibuprofen, Motrin, Naproxen, Naprosyn and Aspirin based products such as Excedrin, Goody's Powder, BC Powder. You may however, continue to take Tylenol if needed for pain up until the day of surgery.  Stop taking on 07/18/23, ANY OVER THE COUNTER supplements until after surgery. VITAMIN D , Multiple Vitamins-Minerals .   TAKE ONLY THESE MEDICATIONS THE MORNING OF SURGERY WITH A SIP OF WATER:  levothyroxine (SYNTHROID)  sertraline (ZOLOFT)  omeprazole  (PRILOSEC) take on the morning of surgery and take one on the night before.   No Alcohol for 24 hours before or after surgery.  No Smoking including e-cigarettes for 24 hours before surgery.  No chewable tobacco products for at least 6 hours before surgery.  No nicotine patches on the day of surgery.  Do not use any "recreational" drugs for at least a week (preferably 2 weeks) before your surgery.  Please be advised that the combination of cocaine and anesthesia may have negative outcomes, up to and including death. If you test positive for cocaine, your surgery will be cancelled.  On the morning of surgery brush your teeth with toothpaste and water, you may rinse your mouth with mouthwash if you wish. Do not swallow any toothpaste or mouthwash.  Use CHG Soap or wipes as directed on instruction sheet.  Do not wear jewelry, make-up, hairpins, clips or nail polish.  For welded (permanent) jewelry: bracelets, anklets, waist bands, etc.  Please have this removed prior to surgery.  If it is not removed, there is a chance that hospital personnel will need to cut it off on the day of surgery.  Do not wear lotions, powders, or perfumes.   Contact lenses, hearing aids and dentures may not be worn into surgery.  Do not bring valuables to the hospital. [facility] is not responsible for any missing/lost belongings or valuables.   Notify your doctor if there is any change in your medical condition (cold, fever, infection).  Wear comfortable clothing (specific to your surgery type) to the hospital.  After surgery, you can help prevent lung complications by doing breathing exercises.  Take deep breaths and cough every 1-2 hours. Your doctor may order a device called an Incentive Spirometer to help you take deep breaths. When coughing or sneezing, hold a pillow firmly against your incision with both hands. This is called "splinting." Doing this helps protect your incision. It also decreases belly  discomfort.  If you are being admitted to the hospital overnight, leave your suitcase in the car. After surgery it may be brought to your room.  In case of increased patient census, it may be necessary for you, the patient, to continue your postoperative care in the Same Day Surgery department.  If you are being discharged the day of surgery, you will not be allowed to drive home. You will need a responsible individual to drive you home and stay with you for 24 hours after surgery.   If you are taking public transportation, you will need to have a responsible individual with you.  Please call the Pre-admissions Testing Dept. at [phone] if you have any questions about these instructions.  Surgery Visitation Policy:  Patients having surgery or a procedure may have two visitors.  Children under the age of 59 must have an adult with them who is not the patient.  Inpatient Visitation:    Visiting hours are 7 a.m. to 8 p.m. Up to four visitors are allowed at one time in a patient room. The visitors may rotate out with other people during the day.  One visitor age 33 or older may stay with the patient overnight and must be in the room by 8 p.m.    Pre-operative 5 CHG Bath Instructions   You can play a key role in reducing the risk of infection after surgery. Your skin needs to be as free of germs as possible. You can reduce the number of germs on your skin by washing with CHG (chlorhexidine gluconate) soap before surgery. CHG is an antiseptic soap that kills germs and continues to kill germs even after washing.   DO NOT use if you have an allergy to chlorhexidine/CHG or antibacterial soaps. If your skin becomes reddened or irritated, stop using the CHG and notify one of our RNs at [phone].   Please shower with the CHG soap starting 4 days before surgery using the following schedule: 09/21 - 09/25.    Please keep in mind the following:  DO NOT shave, including legs and  underarms, starting the day of your first shower.   You may shave your face at any point before/day of surgery.  Place clean sheets on your bed the day you start using CHG soap. Use a clean washcloth (not used since being washed) for each shower. DO NOT sleep with pets once you start using the CHG.   CHG Shower Instructions:  If you choose to wash your hair and private area, wash first with your normal shampoo/soap.  After you use shampoo/soap, rinse your hair and body thoroughly to remove shampoo/soap residue.  Turn the water OFF and apply about 3 tablespoons (45 ml) of CHG soap to a CLEAN washcloth.  Apply CHG soap ONLY FROM YOUR NECK DOWN TO YOUR TOES (washing for 3-5 minutes)  DO NOT use CHG soap on face, private areas, open wounds, or sores.  Pay special attention to the area where your surgery is being performed.  If you are having back surgery, having someone wash your back for you may be helpful. Wait  2 minutes after CHG soap is applied, then you may rinse off the CHG soap.  Pat dry with a clean towel  Put on clean clothes/pajamas   If you choose to wear lotion, please use ONLY the CHG-compatible lotions on the back of this paper.     Additional instructions for the day of surgery: DO NOT APPLY any lotions, deodorants, cologne, or perfumes.   Put on clean/comfortable clothes.  Brush your teeth.  Ask your nurse before applying any prescription medications to the skin.      CHG Compatible Lotions   Aveeno Moisturizing lotion  Cetaphil Moisturizing Cream  Cetaphil Moisturizing Lotion  Clairol Herbal Essence Moisturizing Lotion, Dry Skin  Clairol Herbal Essence Moisturizing Lotion, Extra Dry Skin  Clairol Herbal Essence Moisturizing Lotion, Normal Skin  Curel Age Defying Therapeutic Moisturizing Lotion with Alpha Hydroxy  Curel Extreme Care Body Lotion  Curel Soothing Hands Moisturizing Hand Lotion  Curel Therapeutic Moisturizing Cream, Fragrance-Free  Curel Therapeutic  Moisturizing Lotion, Fragrance-Free  Curel Therapeutic Moisturizing Lotion, Original Formula  Eucerin Daily Replenishing Lotion  Eucerin Dry Skin Therapy Plus Alpha Hydroxy Crme  Eucerin Dry Skin Therapy Plus Alpha Hydroxy Lotion  Eucerin Original Crme  Eucerin Original Lotion  Eucerin Plus Crme Eucerin Plus Lotion  Eucerin TriLipid Replenishing Lotion  Keri Anti-Bacterial Hand Lotion  Keri Deep Conditioning Original Lotion Dry Skin Formula Softly Scented  Keri Deep Conditioning Original Lotion, Fragrance Free Sensitive Skin Formula  Keri Lotion Fast Absorbing Fragrance Free Sensitive Skin Formula  Keri Lotion Fast Absorbing Softly Scented Dry Skin Formula  Keri Original Lotion  Keri Skin Renewal Lotion Keri Silky Smooth Lotion  Keri Silky Smooth Sensitive Skin Lotion  Nivea Body Creamy Conditioning Oil  Nivea Body Extra Enriched Lotion  Nivea Body Original Lotion  Nivea Body Sheer Moisturizing Lotion Nivea Crme  Nivea Skin Firming Lotion  NutraDerm 30 Skin Lotion  NutraDerm Skin Lotion  NutraDerm Therapeutic Skin Cream  NutraDerm Therapeutic Skin Lotion  ProShield Protective Hand Cream  Provon moisturizing lotion  How to Use an Incentive Spirometer  An incentive spirometer is a tool that measures how well you are filling your lungs with each breath. Learning to take long, deep breaths using this tool can help you keep your lungs clear and active. This may help to reverse or lessen your chance of developing breathing (pulmonary) problems, especially infection. You may be asked to use a spirometer: After a surgery. If you have a lung problem or a history of smoking. After a long period of time when you have been unable to move or be active. If the spirometer includes an indicator to show the highest number that you have reached, your health care provider or respiratory therapist will help you set a goal. Keep a log of your progress as told by your health care provider. What are  the risks? Breathing too quickly may cause dizziness or cause you to pass out. Take your time so you do not get dizzy or light-headed. If you are in pain, you may need to take pain medicine before doing incentive spirometry. It is harder to take a deep breath if you are having pain. How to use your incentive spirometer  Sit up on the edge of your bed or on a chair. Hold the incentive spirometer so that it is in an upright position. Before you use the spirometer, breathe out normally. Place the mouthpiece in your mouth. Make sure your lips are closed tightly around it. Breathe in slowly and  as deeply as you can through your mouth, causing the piston or the ball to rise toward the top of the chamber. Hold your breath for 3-5 seconds, or for as long as possible. If the spirometer includes a coach indicator, use this to guide you in breathing. Slow down your breathing if the indicator goes above the marked areas. Remove the mouthpiece from your mouth and breathe out normally. The piston or ball will return to the bottom of the chamber. Rest for a few seconds, then repeat the steps 10 or more times. Take your time and take a few normal breaths between deep breaths so that you do not get dizzy or light-headed. Do this every 1-2 hours when you are awake. If the spirometer includes a goal marker to show the highest number you have reached (best effort), use this as a goal to work toward during each repetition. After each set of 10 deep breaths, cough a few times. This will help to make sure that your lungs are clear. If you have an incision on your chest or abdomen from surgery, place a pillow or a rolled-up towel firmly against the incision when you cough. This can help to reduce pain while taking deep breaths and coughing. General tips When you are able to get out of bed: Walk around often. Continue to take deep breaths and cough in order to clear your lungs. Keep using the incentive spirometer  until your health care provider says it is okay to stop using it. If you have been in the hospital, you may be told to keep using the spirometer at home. Contact a health care provider if: You are having difficulty using the spirometer. You have trouble using the spirometer as often as instructed. Your pain medicine is not giving enough relief for you to use the spirometer as told. You have a fever. Get help right away if: You develop shortness of breath. You develop a cough with bloody mucus from the lungs. You have fluid or blood coming from an incision site after you cough. Summary An incentive spirometer is a tool that can help you learn to take long, deep breaths to keep your lungs clear and active. You may be asked to use a spirometer after a surgery, if you have a lung problem or a history of smoking, or if you have been inactive for a long period of time. Use your incentive spirometer as instructed every 1-2 hours while you are awake. If you have an incision on your chest or abdomen, place a pillow or a rolled-up towel firmly against your incision when you cough. This will help to reduce pain. Get help right away if you have shortness of breath, you cough up bloody mucus, or blood comes from your incision when you cough. This information is not intended to replace advice given to you by your health care provider. Make sure you discuss any questions you have with your health care provider. Document Revised: 01/05/2020 Document Reviewed: 01/05/2020 Elsevier Patient Education  2023 Elsevier Inc.    POLAR CARE INFORMATION  MassAdvertisement.it  How to use [facility] Therapy System?  YouTube   ShippingScam.co.uk  OPERATING INSTRUCTIONS  Start the product With dry hands, connect the transformer to the electrical connection located on the top of the cooler. Next, plug the transformer into an appropriate electrical outlet. The unit will automatically  start running at this point.  To stop the pump, disconnect electrical power.  Unplug to stop the  product when not in use. Unplugging the Polar Care unit turns it off. Always unplug immediately after use. Never leave it plugged in while unattended. Remove pad.    FIRST ADD WATER TO FILL LINE, THEN ICE---Replace ice when existing ice is almost melted  1 Discuss Treatment with your Licensed Health Care Practitioner and Use Only as Prescribed 2 Apply Insulation Barrier & Cold Therapy Pad 3 Check for Moisture 4 Inspect Skin Regularly  Tips and Trouble Shooting Usage Tips 1. Use cubed or chunked ice for optimal performance. 2. It is recommended to drain the Pad between uses. To drain the pad, hold the Pad upright with the hose pointed toward the ground. Depress the black plunger and allow water to drain out. 3. You may disconnect the Pad from the unit without removing the pad from the affected area by depressing the silver tabs on the hose coupling and gently pulling the hoses apart. The Pad and unit will seal itself and will not leak. Note: Some dripping during release is normal. 4. DO NOT RUN PUMP WITHOUT WATER! The pump in this unit is designed to run with water. Running the unit without water will cause permanent damage to the pump. 5. Unplug unit before removing lid.  TROUBLESHOOTING GUIDE Pump not running, Water not flowing to the pad, Pad is not getting cold 1. Make sure the transformer is plugged into the wall outlet. 2. Confirm that the ice and water are filled to the indicated levels. 3. Make sure there are no kinks in the pad. 4. Gently pull on the blue tube to make sure the tube/pad junction is straight. 5. Remove the pad from the treatment site and ll it while the pad is lying at; then reapply. 6. Confirm that the pad couplings are securely attached to the unit. Listen for the double clicks (Figure 1) to confirm the pad couplings are securely attached.  Leaks    Note: Some  condensation on the lines, controller, and pads is unavoidable, especially in warmer climates. 1. If using a [facility] Polar Care Cold Therapy unit with a detachable Cold Therapy Pad, and a leak exists (other than condensation on the lines) disconnect the pad couplings. Make sure the silver tabs on the couplings are depressed before reconnecting the pad to the pump hose; then confirm both sides of the coupling are properly clicked in. 2. If the coupling continues to leak or a leak is detected in the pad itself, stop using it and call [employer] Customer Care at [phone].  Cleaning After use, empty and dry the unit with a soft cloth. Warm water and mild detergent may be used occasionally to clean the pump and tubes.  WARNING: The Polar Care Cube can be cold enough to cause serious injury, including full skin necrosis. Follow these Operating Instructions, and carefully read the Product Insert (see pouch on side of unit) and the Cold Therapy Pad Fitting Instructions (provided with each Cold Therapy Pad) prior to use.

## 2023-07-20 ENCOUNTER — Encounter: Payer: Self-pay | Admitting: Nurse Practitioner

## 2023-07-20 DIAGNOSIS — E876 Hypokalemia: Secondary | ICD-10-CM

## 2023-07-20 MED ORDER — POTASSIUM CHLORIDE CRYS ER 10 MEQ PO TBCR: 20.0000 meq | EXTENDED_RELEASE_TABLET | Freq: Two times a day (BID) | ORAL | 0 refills | Status: DC

## 2023-07-20 NOTE — Telephone Encounter (Signed)
Copied from CRM [id number]. Topic: General - Other >> Jul 20, 2023 11:54 AM Phill Myron wrote: Leslie Turner w/ [facility] office is calling in regards to Leslie Turner 07/18/23 Comprehensive metabolic panel: Her Potassium is reading low and she is scheduled to have a RTK  procedure on 07/25/23. Please advise.

## 2023-07-20 NOTE — Addendum Note (Signed)
Addended by: Aura Dials T on: 07/20/2023 12:43 PM   Modules accepted: Orders

## 2023-07-23 ENCOUNTER — Other Ambulatory Visit: Payer: Medicare Other

## 2023-07-23 DIAGNOSIS — E876 Hypokalemia: Secondary | ICD-10-CM

## 2023-07-23 NOTE — H&P (Signed)
ORTHOPAEDIC HISTORY & PHYSICAL Latanya Maudlin, PA - 07/18/2023 11:00 AM EDT Formatting of this note is different from the original. Images from the original note were not included. Chief Complaint Chief Complaint Patient presents with Knee Pain H & P RIGHT KNEE  Reason for Visit Leslie Turner is a 67 y.o. who presents today for a history and physical. She is to undergo a right total knee arthroplasty on 07/25/2023. Since her last visit at the clinic there is been no improvement in her condition. The patient expresses her desire to proceed with surgery.  She has a long history of bilateral knee pain with the left knee more symptomatic. She localizes most of the pain along the medial aspect of the knees. She reports some swelling, some locking, and some giving way of the knees. The pain is aggravated by any weight bearing, standing, and walking. The knee pain limits the patient's ability to ambulate long distances. The patient has not appreciated any significant improvement despite NSAIDs, knee sleeve, intraarticular corticosteroid injections, viscosupplementation, and activity modification. She is not using any ambulatory aids. The patient states that the knee pain has progressed to the point that it is significantly interfering with her activities of daily living.  Past Medical History Past Medical History: Diagnosis Date Hyperlipidemia Hypertension  Past Surgical History Past Surgical History: Procedure Laterality Date Left total knee arthroplasty using computer-assisted navigation 03/02/2023 Dr Ernest Pine APPENDECTOMY  Past Family History History reviewed. No pertinent family history.  Medications Current Outpatient Medications Medication Sig Dispense Refill atorvastatin (LIPITOR) 40 MG tablet Take 40 mg by mouth once daily cholecalciferol (CHOLECALCIFEROL) 1,000 unit tablet Take 1,000 Units by mouth once daily hydroCHLOROthiazide (HYDRODIURIL) 25 MG tablet Take 12.5 mg by mouth  once daily levothyroxine (SYNTHROID, LEVOTHROID) 112 MCG tablet Take 112 mcg by mouth once daily losartan (COZAAR) 50 MG tablet Take 50 mg by mouth once daily meloxicam (MOBIC) 7.5 MG tablet Take 7.5 mg by mouth once daily multivit-min-iron-FA-vit K-lut (CENTRUM SILVER WOMEN) 8 mg iron-400 mcg-50 mcg Tab Take 1 tablet by mouth once daily omeprazole (PRILOSEC) 20 MG DR capsule Take 20 mg by mouth once daily sertraline (ZOLOFT) 100 MG tablet Take 50 mg by mouth once daily  No current facility-administered medications for this visit.  Allergies No Known Allergies  Review of Systems A comprehensive 14 point ROS was performed, reviewed, and the pertinent orthopaedic findings are documented in the HPI.  Exam BP 110/64 (BP Location: Left upper arm, Patient Position: Sitting, BP Cuff Size: Adult)  Ht 163.8 cm (5' 4.5")  Wt 90.2 kg (198 lb 12.8 oz)  BMI 33.60 kg/m  General: Well-developed well-nourished female seen in no acute distress.  HEENT: Atraumatic,normocephalic. Pupils are equal and reactive to light. Oropharynx is clear with moist mucosa  Lungs: Clear to auscultation bilaterally  Cardiovascular: Regular rate and rhythm. Normal S1, S2. No murmurs. No appreciable gallops or rubs. Peripheral pulses are palpable.  Abdomen: Soft, non-tender, nondistended. Bowel sounds present  Extremity: Right Knee: Soft tissue swelling: mild Effusion: minimal Erythema: none Crepitance: mild Tenderness: medial Alignment: relative varus Mediolateral laxity: medial pseudolaxity Posterior sag: negative Patellar tracking: Good tracking without evidence of subluxation or tilt Atrophy: No significant atrophy. Quadriceps tone was fair to good. Range of motion: 0/3/120 degrees  Neurological:  The patient is alert and oriented Sensation to light touch appears to be intact and within normal limits Gross motor strength appeared to be equal to 5/5  Vascular :  Peripheral pulses felt to be  palpable. Capillary  refill appears to be intact and within normal limits  X-ray  1. AP standing, lateral and sunrise view of the right knee ordered and interpreted on today's visit shows bone-on-bone to the medial compartment space with subchondral sclerosis been noted. She is noted to have osteophytes in the tricompartmental flexion. Patella is tracking well. There is large osteophytes on the femoral condyle on the sunrise view. Very mild varus deformity noted.  Impression  1. Degenerative arthrosis right knee  Plan  1. I have gone over the patient's medication on today's visit 2. Past medical history reviewed 3. Postop rehab discussed 4. Return to clinic 2 weeks postop. Sooner for any problems  This note was generated in part with voice recognition software and I apologize for any typographical errors that were not detected and corrected   Tera Partridge PA Electronically signed by Latanya Maudlin, PA at 07/18/2023 11:44 AM EDT

## 2023-07-24 LAB — BASIC METABOLIC PANEL
BUN/Creatinine Ratio: 13 (ref 12–28)
BUN: 12 mg/dL (ref 8–27)
CO2: 22 mmol/L (ref 20–29)
Calcium: 9.3 mg/dL (ref 8.7–10.3)
Chloride: 106 mmol/L (ref 96–106)
Creatinine, Ser: 0.93 mg/dL (ref 0.57–1.00)
Glucose: 99 mg/dL (ref 70–99)
Potassium: 4.5 mmol/L (ref 3.5–5.2)
Sodium: 142 mmol/L (ref 134–144)
eGFR: 68 mL/min/{1.73_m2} (ref >59)

## 2023-07-24 MED ORDER — ORAL CARE MOUTH RINSE: 15.0000 mL | Freq: Once | OROMUCOSAL | Status: AC

## 2023-07-24 MED ORDER — TRANEXAMIC ACID-NACL 1000-0.7 MG/100ML-% IV SOLN
1000.0000 mg | INTRAVENOUS | Status: AC
  Administered 2023-07-25: 1000 mg via INTRAVENOUS

## 2023-07-24 MED ORDER — CHLORHEXIDINE GLUCONATE 0.12 % MT SOLN
15.0000 mL | Freq: Once | OROMUCOSAL | Status: AC
  Administered 2023-07-25: 15 mL via OROMUCOSAL

## 2023-07-24 MED ORDER — CEFAZOLIN SODIUM-DEXTROSE 2-4 GM/100ML-% IV SOLN
2.0000 g | INTRAVENOUS | Status: AC
  Administered 2023-07-25: 2 g via INTRAVENOUS

## 2023-07-24 MED ORDER — DEXAMETHASONE SODIUM PHOSPHATE 10 MG/ML IJ SOLN
8.0000 mg | Freq: Once | INTRAMUSCULAR | Status: AC
  Administered 2023-07-25: 8 mg via INTRAVENOUS

## 2023-07-24 MED ORDER — GABAPENTIN 300 MG PO CAPS
300.0000 mg | ORAL_CAPSULE | Freq: Once | ORAL | Status: AC
  Administered 2023-07-25: 300 mg via ORAL

## 2023-07-24 MED ORDER — FAMOTIDINE 20 MG PO TABS
20.0000 mg | ORAL_TABLET | Freq: Once | ORAL | Status: AC
  Administered 2023-07-25: 20 mg via ORAL

## 2023-07-24 MED ORDER — CHLORHEXIDINE GLUCONATE 4 % EX SOLN: 60.0000 mL | Freq: Once | CUTANEOUS | Status: DC

## 2023-07-24 MED ORDER — CELECOXIB 200 MG PO CAPS
400.0000 mg | ORAL_CAPSULE | Freq: Once | ORAL | Status: AC
  Administered 2023-07-25: 400 mg via ORAL

## 2023-07-24 MED ORDER — LACTATED RINGERS IV SOLN: INTRAVENOUS | Status: DC

## 2023-07-24 NOTE — Progress Notes (Signed)
Contacted via MyChart   Your potassium is MUCH improved from 2.9 to now 4.5.  You are good to go.  Ensure to get plenty of potassium rich foods in diet.  Let Dr. Ernest Pine know all is well now:) Good luck!! Big hugs!

## 2023-07-25 ENCOUNTER — Ambulatory Visit: Payer: Medicare Other | Admitting: Urgent Care

## 2023-07-25 ENCOUNTER — Other Ambulatory Visit: Payer: Self-pay

## 2023-07-25 ENCOUNTER — Encounter: Payer: Self-pay | Admitting: Orthopedic Surgery

## 2023-07-25 ENCOUNTER — Observation Stay: Payer: Medicare Other

## 2023-07-25 ENCOUNTER — Encounter
Admission: RE | Disposition: A | Discharge status: Home / Self Care | Payer: Self-pay | Source: Home / Self Care | Attending: Orthopedic Surgery

## 2023-07-25 ENCOUNTER — Observation Stay
Admission: RE | Admit: 2023-07-25 | Discharge: 2023-07-26 | Disposition: A | Discharge status: Home / Self Care | Payer: Medicare Other | Attending: Orthopedic Surgery | Admitting: Orthopedic Surgery

## 2023-07-25 ENCOUNTER — Ambulatory Visit: Payer: Medicare Other | Admitting: Anesthesiology

## 2023-07-25 DIAGNOSIS — M1711 Unilateral primary osteoarthritis, right knee: Principal | ICD-10-CM | POA: Insufficient documentation

## 2023-07-25 DIAGNOSIS — Z01812 Encounter for preprocedural laboratory examination: Secondary | ICD-10-CM

## 2023-07-25 DIAGNOSIS — E876 Hypokalemia: Secondary | ICD-10-CM

## 2023-07-25 DIAGNOSIS — I10 Essential (primary) hypertension: Secondary | ICD-10-CM | POA: Insufficient documentation

## 2023-07-25 DIAGNOSIS — Z79899 Other long term (current) drug therapy: Secondary | ICD-10-CM | POA: Insufficient documentation

## 2023-07-25 DIAGNOSIS — Z96659 Presence of unspecified artificial knee joint: Secondary | ICD-10-CM

## 2023-07-25 DIAGNOSIS — Z96652 Presence of left artificial knee joint: Secondary | ICD-10-CM | POA: Diagnosis not present

## 2023-07-25 HISTORY — PX: KNEE ARTHROPLASTY: SHX992

## 2023-07-25 LAB — POCT I-STAT, CHEM 8
BUN: 11 mg/dL (ref 8–23)
Calcium, Ion: 1.04 mmol/L — ABNORMAL LOW (ref 1.15–1.40)
Chloride: 107 mmol/L (ref 98–111)
Creatinine, Ser: 0.9 mg/dL (ref 0.44–1.00)
Glucose, Bld: 117 mg/dL — ABNORMAL HIGH (ref 70–99)
HCT: 41 % (ref 36.0–46.0)
Hemoglobin: 13.9 g/dL (ref 12.0–15.0)
Potassium: 4.7 mmol/L (ref 3.5–5.1)
Sodium: 137 mmol/L (ref 135–145)
TCO2: 21 mmol/L — ABNORMAL LOW (ref 22–32)

## 2023-07-25 SURGERY — ARTHROPLASTY, KNEE, TOTAL, USING IMAGELESS COMPUTER-ASSISTED NAVIGATION
Anesthesia: General | Site: Knee | Laterality: Right

## 2023-07-25 MED ORDER — OXYCODONE HCL 5 MG PO TABS
ORAL_TABLET | ORAL | Status: AC
  Filled 2023-07-25: qty 1

## 2023-07-25 MED ORDER — ACETAMINOPHEN 325 MG PO TABS: 325.0000 mg | ORAL_TABLET | Freq: Four times a day (QID) | ORAL | Status: DC | PRN

## 2023-07-25 MED ORDER — ONDANSETRON HCL 4 MG PO TABS: 4.0000 mg | ORAL_TABLET | Freq: Four times a day (QID) | ORAL | Status: DC | PRN

## 2023-07-25 MED ORDER — ATORVASTATIN CALCIUM 20 MG PO TABS
40.0000 mg | ORAL_TABLET | Freq: Every day | ORAL | Status: DC
  Administered 2023-07-25 – 2023-07-26 (×2): 40 mg via ORAL

## 2023-07-25 MED ORDER — PANTOPRAZOLE SODIUM 40 MG PO TBEC
40.0000 mg | DELAYED_RELEASE_TABLET | Freq: Two times a day (BID) | ORAL | Status: DC
  Administered 2023-07-25 – 2023-07-26 (×2): 40 mg via ORAL

## 2023-07-25 MED ORDER — ONDANSETRON HCL 4 MG/2ML IJ SOLN: 4.0000 mg | Freq: Once | INTRAMUSCULAR | Status: DC | PRN

## 2023-07-25 MED ORDER — METOCLOPRAMIDE HCL 10 MG PO TABS
ORAL_TABLET | ORAL | Status: AC
  Filled 2023-07-25: qty 1

## 2023-07-25 MED ORDER — LACTATED RINGERS IV SOLN: INTRAVENOUS | Status: DC

## 2023-07-25 MED ORDER — BISACODYL 10 MG RE SUPP: 10.0000 mg | Freq: Every day | RECTAL | Status: DC | PRN

## 2023-07-25 MED ORDER — CEFAZOLIN SODIUM-DEXTROSE 2-4 GM/100ML-% IV SOLN
2.0000 g | Freq: Four times a day (QID) | INTRAVENOUS | Status: AC
  Administered 2023-07-25 – 2023-07-26 (×2): 2 g via INTRAVENOUS
  Filled 2023-07-25 (×2): qty 100

## 2023-07-25 MED ORDER — PHENYLEPHRINE 80 MCG/ML (10ML) SYRINGE FOR IV PUSH (FOR BLOOD PRESSURE SUPPORT)
PREFILLED_SYRINGE | INTRAVENOUS | Status: AC
  Filled 2023-07-25: qty 10

## 2023-07-25 MED ORDER — TRANEXAMIC ACID-NACL 1000-0.7 MG/100ML-% IV SOLN
INTRAVENOUS | Status: AC
  Filled 2023-07-25: qty 100

## 2023-07-25 MED ORDER — MIDAZOLAM HCL 2 MG/2ML IJ SOLN
INTRAMUSCULAR | Status: AC
  Filled 2023-07-25: qty 2

## 2023-07-25 MED ORDER — MAGNESIUM HYDROXIDE 400 MG/5ML PO SUSP
30.0000 mL | Freq: Every day | ORAL | Status: DC
  Administered 2023-07-25 – 2023-07-26 (×2): 30 mL via ORAL

## 2023-07-25 MED ORDER — METOCLOPRAMIDE HCL 10 MG PO TABS
10.0000 mg | ORAL_TABLET | Freq: Three times a day (TID) | ORAL | Status: DC
  Administered 2023-07-25 – 2023-07-26 (×3): 10 mg via ORAL

## 2023-07-25 MED ORDER — PHENOL 1.4 % MT LIQD: 1.0000 | OROMUCOSAL | Status: DC | PRN

## 2023-07-25 MED ORDER — OXYCODONE HCL 5 MG PO TABS: 5.0000 mg | ORAL_TABLET | Freq: Once | ORAL | Status: DC | PRN

## 2023-07-25 MED ORDER — ASPIRIN 81 MG PO CHEW
CHEWABLE_TABLET | ORAL | Status: AC
  Filled 2023-07-25: qty 1

## 2023-07-25 MED ORDER — ACETAMINOPHEN 10 MG/ML IV SOLN
INTRAVENOUS | Status: DC | PRN
  Administered 2023-07-25: 1000 mg via INTRAVENOUS

## 2023-07-25 MED ORDER — FENTANYL CITRATE (PF) 100 MCG/2ML IJ SOLN: 25.0000 ug | INTRAMUSCULAR | Status: DC | PRN

## 2023-07-25 MED ORDER — CEFAZOLIN SODIUM-DEXTROSE 2-4 GM/100ML-% IV SOLN
INTRAVENOUS | Status: AC
  Filled 2023-07-25: qty 100

## 2023-07-25 MED ORDER — LOSARTAN POTASSIUM 50 MG PO TABS
50.0000 mg | ORAL_TABLET | Freq: Every day | ORAL | Status: DC
  Administered 2023-07-25 – 2023-07-26 (×2): 50 mg via ORAL

## 2023-07-25 MED ORDER — SODIUM CHLORIDE 0.9 % IV SOLN: INTRAVENOUS | Status: DC

## 2023-07-25 MED ORDER — TRAMADOL HCL 50 MG PO TABS
ORAL_TABLET | ORAL | Status: AC
  Filled 2023-07-25: qty 1

## 2023-07-25 MED ORDER — PHENYLEPHRINE 80 MCG/ML (10ML) SYRINGE FOR IV PUSH (FOR BLOOD PRESSURE SUPPORT)
PREFILLED_SYRINGE | INTRAVENOUS | Status: DC | PRN
  Administered 2023-07-25: 80 ug via INTRAVENOUS

## 2023-07-25 MED ORDER — POTASSIUM CHLORIDE CRYS ER 20 MEQ PO TBCR
EXTENDED_RELEASE_TABLET | ORAL | Status: AC
  Filled 2023-07-25: qty 1

## 2023-07-25 MED ORDER — SENNOSIDES-DOCUSATE SODIUM 8.6-50 MG PO TABS
1.0000 | ORAL_TABLET | Freq: Two times a day (BID) | ORAL | Status: DC
  Administered 2023-07-25 – 2023-07-26 (×2): 1 via ORAL

## 2023-07-25 MED ORDER — DIPHENHYDRAMINE HCL 12.5 MG/5ML PO ELIX: 12.5000 mg | ORAL_SOLUTION | ORAL | Status: DC | PRN

## 2023-07-25 MED ORDER — ONDANSETRON HCL 4 MG/2ML IJ SOLN: 4.0000 mg | Freq: Four times a day (QID) | INTRAMUSCULAR | Status: DC | PRN

## 2023-07-25 MED ORDER — LEVOTHYROXINE SODIUM 112 MCG PO TABS
112.0000 ug | ORAL_TABLET | Freq: Every day | ORAL | Status: DC
  Administered 2023-07-26: 112 ug via ORAL
  Filled 2023-07-25: qty 1

## 2023-07-25 MED ORDER — SODIUM CHLORIDE (PF) 0.9 % IJ SOLN
INTRAMUSCULAR | Status: DC | PRN
  Administered 2023-07-25: 120 mL via INTRAMUSCULAR

## 2023-07-25 MED ORDER — FENTANYL CITRATE (PF) 100 MCG/2ML IJ SOLN
INTRAMUSCULAR | Status: DC | PRN
  Administered 2023-07-25: 50 ug via INTRAVENOUS

## 2023-07-25 MED ORDER — PROPOFOL 500 MG/50ML IV EMUL
INTRAVENOUS | Status: DC | PRN
  Administered 2023-07-25: 55 ug/kg/min via INTRAVENOUS

## 2023-07-25 MED ORDER — CELECOXIB 200 MG PO CAPS
ORAL_CAPSULE | ORAL | Status: AC
  Filled 2023-07-25: qty 1

## 2023-07-25 MED ORDER — CELECOXIB 200 MG PO CAPS
ORAL_CAPSULE | ORAL | Status: AC
  Filled 2023-07-25: qty 2

## 2023-07-25 MED ORDER — ENSURE PRE-SURGERY PO LIQD
296.0000 mL | Freq: Once | ORAL | Status: DC
  Filled 2023-07-25: qty 296

## 2023-07-25 MED ORDER — OXYCODONE HCL 5 MG PO TABS: 10.0000 mg | ORAL_TABLET | ORAL | Status: DC | PRN

## 2023-07-25 MED ORDER — CHLORHEXIDINE GLUCONATE 0.12 % MT SOLN
OROMUCOSAL | Status: AC
  Filled 2023-07-25: qty 15

## 2023-07-25 MED ORDER — FLEET ENEMA RE ENEM: 1.0000 | ENEMA | Freq: Once | RECTAL | Status: DC | PRN

## 2023-07-25 MED ORDER — GABAPENTIN 300 MG PO CAPS
ORAL_CAPSULE | ORAL | Status: AC
  Filled 2023-07-25: qty 1

## 2023-07-25 MED ORDER — ACETAMINOPHEN 10 MG/ML IV SOLN
INTRAVENOUS | Status: AC
  Filled 2023-07-25: qty 100

## 2023-07-25 MED ORDER — TRAMADOL HCL 50 MG PO TABS
50.0000 mg | ORAL_TABLET | ORAL | Status: DC | PRN
  Administered 2023-07-25: 50 mg via ORAL

## 2023-07-25 MED ORDER — HYDROCHLOROTHIAZIDE 25 MG PO TABS
ORAL_TABLET | ORAL | Status: AC
  Filled 2023-07-25: qty 1

## 2023-07-25 MED ORDER — HYDROCHLOROTHIAZIDE 25 MG PO TABS
12.5000 mg | ORAL_TABLET | Freq: Every day | ORAL | Status: DC
  Administered 2023-07-25 – 2023-07-26 (×2): 12.5 mg via ORAL

## 2023-07-25 MED ORDER — FERROUS SULFATE 325 (65 FE) MG PO TABS
ORAL_TABLET | ORAL | Status: AC
  Filled 2023-07-25: qty 1

## 2023-07-25 MED ORDER — PROPOFOL 10 MG/ML IV BOLUS
INTRAVENOUS | Status: AC
  Filled 2023-07-25: qty 40

## 2023-07-25 MED ORDER — TRANEXAMIC ACID-NACL 1000-0.7 MG/100ML-% IV SOLN
1000.0000 mg | Freq: Once | INTRAVENOUS | Status: AC
  Administered 2023-07-25: 1000 mg via INTRAVENOUS

## 2023-07-25 MED ORDER — MAGNESIUM HYDROXIDE 400 MG/5ML PO SUSP
ORAL | Status: AC
  Filled 2023-07-25: qty 30

## 2023-07-25 MED ORDER — FERROUS SULFATE 325 (65 FE) MG PO TABS
325.0000 mg | ORAL_TABLET | Freq: Two times a day (BID) | ORAL | Status: DC
  Administered 2023-07-25 – 2023-07-26 (×2): 325 mg via ORAL

## 2023-07-25 MED ORDER — ALUM & MAG HYDROXIDE-SIMETH 200-200-20 MG/5ML PO SUSP: 30.0000 mL | ORAL | Status: DC | PRN

## 2023-07-25 MED ORDER — ASPIRIN 81 MG PO CHEW
81.0000 mg | CHEWABLE_TABLET | Freq: Two times a day (BID) | ORAL | Status: DC
  Administered 2023-07-25 – 2023-07-26 (×2): 81 mg via ORAL

## 2023-07-25 MED ORDER — PANTOPRAZOLE SODIUM 40 MG PO TBEC
DELAYED_RELEASE_TABLET | ORAL | Status: AC
  Filled 2023-07-25: qty 1

## 2023-07-25 MED ORDER — ACETAMINOPHEN 10 MG/ML IV SOLN
1000.0000 mg | Freq: Four times a day (QID) | INTRAVENOUS | Status: DC
  Administered 2023-07-25 – 2023-07-26 (×3): 1000 mg via INTRAVENOUS

## 2023-07-25 MED ORDER — MENTHOL 3 MG MT LOZG
1.0000 | LOZENGE | OROMUCOSAL | Status: DC | PRN
  Administered 2023-07-25: 3 mg via ORAL
  Filled 2023-07-25: qty 9

## 2023-07-25 MED ORDER — DEXAMETHASONE SODIUM PHOSPHATE 10 MG/ML IJ SOLN
INTRAMUSCULAR | Status: AC
  Filled 2023-07-25: qty 1

## 2023-07-25 MED ORDER — ACETAMINOPHEN 10 MG/ML IV SOLN: 1000.0000 mg | Freq: Once | INTRAVENOUS | Status: DC | PRN

## 2023-07-25 MED ORDER — OXYCODONE HCL 5 MG/5ML PO SOLN: 5.0000 mg | Freq: Once | ORAL | Status: DC | PRN

## 2023-07-25 MED ORDER — FAMOTIDINE 20 MG PO TABS
ORAL_TABLET | ORAL | Status: AC
  Filled 2023-07-25: qty 1

## 2023-07-25 MED ORDER — HYDROMORPHONE HCL 1 MG/ML IJ SOLN: 0.5000 mg | INTRAMUSCULAR | Status: DC | PRN

## 2023-07-25 MED ORDER — MIDAZOLAM HCL 5 MG/5ML IJ SOLN
INTRAMUSCULAR | Status: DC | PRN
  Administered 2023-07-25: 2 mg via INTRAVENOUS

## 2023-07-25 MED ORDER — SURGIPHOR WOUND IRRIGATION SYSTEM - OPTIME: TOPICAL | Status: DC | PRN

## 2023-07-25 MED ORDER — CELECOXIB 200 MG PO CAPS
200.0000 mg | ORAL_CAPSULE | Freq: Two times a day (BID) | ORAL | Status: DC
  Administered 2023-07-25 – 2023-07-26 (×2): 200 mg via ORAL
  Filled 2023-07-25 (×4): qty 1

## 2023-07-25 MED ORDER — SODIUM CHLORIDE 0.9 % IR SOLN
Status: DC | PRN
  Administered 2023-07-25: 3000 mL

## 2023-07-25 MED ORDER — OXYCODONE HCL 5 MG PO TABS
5.0000 mg | ORAL_TABLET | ORAL | Status: DC | PRN
  Administered 2023-07-25 – 2023-07-26 (×2): 5 mg via ORAL

## 2023-07-25 MED ORDER — FENTANYL CITRATE (PF) 100 MCG/2ML IJ SOLN
INTRAMUSCULAR | Status: AC
  Filled 2023-07-25: qty 2

## 2023-07-25 MED ORDER — SERTRALINE HCL 50 MG PO TABS
50.0000 mg | ORAL_TABLET | Freq: Every day | ORAL | Status: DC
  Administered 2023-07-26: 50 mg via ORAL

## 2023-07-25 MED ORDER — BUPIVACAINE HCL (PF) 0.5 % IJ SOLN
INTRAMUSCULAR | Status: DC | PRN
  Administered 2023-07-25: 3 mL

## 2023-07-25 MED ORDER — POTASSIUM CHLORIDE CRYS ER 20 MEQ PO TBCR
20.0000 meq | EXTENDED_RELEASE_TABLET | Freq: Two times a day (BID) | ORAL | Status: DC
  Administered 2023-07-25 – 2023-07-26 (×2): 20 meq via ORAL

## 2023-07-25 MED ORDER — ATORVASTATIN CALCIUM 20 MG PO TABS
ORAL_TABLET | ORAL | Status: AC
  Filled 2023-07-25: qty 2

## 2023-07-25 MED ORDER — SENNOSIDES-DOCUSATE SODIUM 8.6-50 MG PO TABS
ORAL_TABLET | ORAL | Status: AC
  Filled 2023-07-25: qty 1

## 2023-07-25 MED ORDER — LOSARTAN POTASSIUM 50 MG PO TABS
ORAL_TABLET | ORAL | Status: AC
  Filled 2023-07-25: qty 1

## 2023-07-25 SURGICAL SUPPLY — 76 items
ATTUNE PS FEM RT SZ 6 CEM KNEE (Femur) IMPLANT
ATTUNE PSRP INSR SZ6 5 KNEE (Insert) IMPLANT
BASE TIBIA ATTUNE KNEE SYS SZ6 (Knees) IMPLANT
BATTERY INSTRU NAVIGATION (MISCELLANEOUS) ×4 IMPLANT
BIT DRILL QUICK REL 1/8 2PK SL (BIT) ×1 IMPLANT
BLADE CLIPPER SURG (BLADE) IMPLANT
BLADE SAW 70X12.5 (BLADE) ×1 IMPLANT
BLADE SAW 90X13X1.19 OSCILLAT (BLADE) ×1 IMPLANT
BLADE SAW 90X25X1.19 OSCILLAT (BLADE) ×1 IMPLANT
BRUSH SCRUB EZ PLAIN DRY (MISCELLANEOUS) ×1 IMPLANT
BSPLAT TIB 6 CMNT ROT PLAT STR (Knees) ×1 IMPLANT
BTRY SRG DRVR LF (MISCELLANEOUS) ×4
COOLER POLAR GLACIER W/PUMP (MISCELLANEOUS) ×1 IMPLANT
CUFF TOURN SGL QUICK 24 (TOURNIQUET CUFF)
CUFF TOURN SGL QUICK 30 (TOURNIQUET CUFF) ×1
CUFF TRNQT CYL 24X4X16.5-23 (TOURNIQUET CUFF) IMPLANT
CUFF TRNQT CYL 30X4X21-28X (TOURNIQUET CUFF) IMPLANT
DRAPE INCISE IOBAN 66X45 STRL (DRAPES) IMPLANT
DRAPE SHEET LG 3/4 BI-LAMINATE (DRAPES) ×1 IMPLANT
DRSG AQUACEL AG ADV 3.5X14 (GAUZE/BANDAGES/DRESSINGS) ×1 IMPLANT
DRSG MEPILEX SACRM 8.7X9.8 (GAUZE/BANDAGES/DRESSINGS) ×1 IMPLANT
DRSG TEGADERM 4X4.75 (GAUZE/BANDAGES/DRESSINGS) ×1 IMPLANT
DURAPREP 26ML APPLICATOR (WOUND CARE) ×2 IMPLANT
ELECT CAUTERY BLADE 6.4 (BLADE) ×1 IMPLANT
ELECT REM PT RETURN 9FT ADLT (ELECTROSURGICAL) ×1
ELECTRODE REM PT RTRN 9FT ADLT (ELECTROSURGICAL) ×1 IMPLANT
EVACUATOR 1/8 PVC DRAIN (DRAIN) ×1 IMPLANT
EX-PIN ORTHOLOCK NAV 4X150 (PIN) ×2 IMPLANT
GAUZE XEROFORM 1X8 LF (GAUZE/BANDAGES/DRESSINGS) ×1 IMPLANT
GLOVE BIOGEL M STRL SZ7.5 (GLOVE) ×4 IMPLANT
GLOVE SRG 8 PF TXTR STRL LF DI (GLOVE) ×2 IMPLANT
GLOVE SURG UNDER POLY LF SZ8 (GLOVE) ×2
GOWN STRL REUS W/ TWL LRG LVL3 (GOWN DISPOSABLE) ×1 IMPLANT
GOWN STRL REUS W/ TWL XL LVL3 (GOWN DISPOSABLE) ×1 IMPLANT
GOWN STRL REUS W/TWL LRG LVL3 (GOWN DISPOSABLE) ×1
GOWN STRL REUS W/TWL XL LVL3 (GOWN DISPOSABLE) ×1
GOWN TOGA ZIPPER T7+ PEEL AWAY (MISCELLANEOUS) ×1 IMPLANT
HANDLE YANKAUER SUCT OPEN TIP (MISCELLANEOUS) ×1 IMPLANT
HOLDER FOLEY CATH W/STRAP (MISCELLANEOUS) ×1 IMPLANT
HOOD PEEL AWAY T7 (MISCELLANEOUS) ×1 IMPLANT
IV NS IRRIG 3000ML ARTHROMATIC (IV SOLUTION) ×1 IMPLANT
KIT TURNOVER KIT A (KITS) ×1 IMPLANT
KNIFE SCULPS 14X20 (INSTRUMENTS) ×1 IMPLANT
MANIFOLD NEPTUNE II (INSTRUMENTS) ×2 IMPLANT
NDL SPNL 20GX3.5 QUINCKE YW (NEEDLE) ×2 IMPLANT
NEEDLE SPNL 20GX3.5 QUINCKE YW (NEEDLE) ×2
PACK TOTAL KNEE (MISCELLANEOUS) ×1 IMPLANT
PAD ABD DERMACEA PRESS 5X9 (GAUZE/BANDAGES/DRESSINGS) ×2 IMPLANT
PAD ARMBOARD 7.5X6 YLW CONV (MISCELLANEOUS) ×3 IMPLANT
PAD WRAPON POLAR KNEE (MISCELLANEOUS) ×1 IMPLANT
PATELLA MEDIAL ATTUN 35MM KNEE (Knees) IMPLANT
PENCIL SMOKE EVACUATOR COATED (MISCELLANEOUS) ×1 IMPLANT
PIN DRILL FIX HALF THREAD (BIT) ×2 IMPLANT
PIN FIXATION 1/8DIA X 3INL (PIN) ×1 IMPLANT
PULSAVAC PLUS IRRIG FAN TIP (DISPOSABLE) ×1
SOL PREP PVP 2OZ (MISCELLANEOUS) ×1
SOLUTION IRRIG SURGIPHOR (IV SOLUTION) ×1 IMPLANT
SOLUTION PREP PVP 2OZ (MISCELLANEOUS) ×1 IMPLANT
SPONGE DRAIN TRACH 4X4 STRL 2S (GAUZE/BANDAGES/DRESSINGS) ×1 IMPLANT
STAPLER SKIN PROX 35W (STAPLE) ×1 IMPLANT
STOCKINETTE IMPERV 14X48 (MISCELLANEOUS) ×1 IMPLANT
STRAP TIBIA SHORT (MISCELLANEOUS) ×1 IMPLANT
SUCTION TUBE FRAZIER 10FR DISP (SUCTIONS) ×1 IMPLANT
SUT VIC AB 0 CT1 36 (SUTURE) ×1 IMPLANT
SUT VIC AB 1 CT1 36 (SUTURE) ×2 IMPLANT
SUT VIC AB 2-0 CT2 27 (SUTURE) ×1 IMPLANT
SYR 30ML LL (SYRINGE) ×2 IMPLANT
TIBIA ATTUNE KNEE SYS BASE SZ6 (Knees) ×1 IMPLANT
TIP FAN IRRIG PULSAVAC PLUS (DISPOSABLE) ×1 IMPLANT
TOWEL OR 17X26 4PK STRL BLUE (TOWEL DISPOSABLE) IMPLANT
TOWER CARTRIDGE SMART MIX (DISPOSABLE) ×1 IMPLANT
TRAP FLUID SMOKE EVACUATOR (MISCELLANEOUS) ×1 IMPLANT
TRAY FOLEY MTR SLVR 16FR STAT (SET/KITS/TRAYS/PACK) ×1 IMPLANT
TUBING CONNECTING 10 (TUBING) ×2 IMPLANT
WATER STERILE IRR 1000ML POUR (IV SOLUTION) ×1 IMPLANT
WRAPON POLAR PAD KNEE (MISCELLANEOUS) ×1

## 2023-07-25 NOTE — Op Note (Signed)
OPERATIVE NOTE  DATE OF SURGERY:  07/25/2023  PATIENT NAME:  Leslie Turner   DOB: 05-30-1956  MRN: [id number]  PRE-OPERATIVE DIAGNOSIS: Degenerative arthrosis of the right knee, primary  POST-OPERATIVE DIAGNOSIS:  Same  PROCEDURE:  Right total knee arthroplasty using computer-assisted navigation  SURGEON:  Jena Gauss. M.D.  ASSISTANT:  Gean Birchwood, PA-C (present and scrubbed throughout the case, critical for assistance with exposure, retraction, instrumentation, and closure)  ANESTHESIA: spinal  ESTIMATED BLOOD LOSS: 50 mL  FLUIDS REPLACED: 1400 mL of crystalloid  TOURNIQUET TIME: 94 minutes  DRAINS: 2 medium Hemovac drains  SOFT TISSUE RELEASES: Anterior cruciate ligament, posterior cruciate ligament, deep medial collateral ligament, patellofemoral ligament  IMPLANTS UTILIZED: DePuy Attune size 6 posterior stabilized femoral component (cemented), size 6 rotating platform tibial component (cemented), 35 mm medialized dome patella (cemented), and a 5 mm stabilized rotating platform polyethylene insert.  INDICATIONS FOR SURGERY: Leslie Turner is a 67 y.o. year old female with a long history of progressive knee pain. X-rays demonstrated severe degenerative changes in tricompartmental fashion. The patient had not seen any significant improvement despite conservative nonsurgical intervention. After discussion of the risks and benefits of surgical intervention, the patient expressed understanding of the risks benefits and agree with plans for total knee arthroplasty.   The risks, benefits, and alternatives were discussed at length including but not limited to the risks of infection, bleeding, nerve injury, stiffness, blood clots, the need for revision surgery, cardiopulmonary complications, among others, and they were willing to proceed.  PROCEDURE IN DETAIL: The patient was brought into the operating room and, after adequate spinal anesthesia was achieved, a tourniquet was  placed on the patient's upper thigh. The patient's knee and leg were cleaned and prepped with alcohol and DuraPrep and draped in the usual sterile fashion. A "timeout" was performed as per usual protocol. The lower extremity was exsanguinated using an Esmarch, and the tourniquet was inflated to 300 mmHg. An anterior longitudinal incision was made followed by a standard mid vastus approach. The deep fibers of the medial collateral ligament were elevated in a subperiosteal fashion off of the medial flare of the tibia so as to maintain a continuous soft tissue sleeve. The patella was subluxed laterally and the patellofemoral ligament was incised. Inspection of the knee demonstrated severe degenerative changes with full-thickness loss of articular cartilage. Osteophytes were debrided using a rongeur. Anterior and posterior cruciate ligaments were excised. Two 4.0 mm Schanz pins were inserted in the femur and into the tibia for attachment of the array of trackers used for computer-assisted navigation. Hip center was identified using a circumduction technique. Distal landmarks were mapped using the computer. The distal femur and proximal tibia were mapped using the computer. The distal femoral cutting guide was positioned using computer-assisted navigation so as to achieve a 5 distal valgus cut. The femur was sized and it was felt that a size 6 femoral component was appropriate. A size 6 femoral cutting guide was positioned and the anterior cut was performed and verified using the computer. This was followed by completion of the posterior and chamfer cuts. Femoral cutting guide for the central box was then positioned in the center box cut was performed.  Attention was then directed to the proximal tibia. Medial and lateral menisci were excised. The extramedullary tibial cutting guide was positioned using computer-assisted navigation so as to achieve a 0 varus-valgus alignment and 3 posterior slope. The cut was  performed and verified using the computer. The proximal tibia  was sized and it was felt that a size 6 tibial tray was appropriate. Tibial and femoral trials were inserted followed by insertion of a 5 mm polyethylene insert. This allowed for excellent mediolateral soft tissue balancing both in flexion and in full extension. Finally, the patella was cut and prepared so as to accommodate a 35 mm medialized dome patella. A patella trial was placed and the knee was placed through a range of motion with excellent patellar tracking appreciated. The femoral trial was removed after debridement of posterior osteophytes. The central post-hole for the tibial component was reamed followed by insertion of a keel punch. Tibial trials were then removed. Cut surfaces of bone were irrigated with copious amounts of normal saline using pulsatile lavage and then suctioned dry. Polymethylmethacrylate cement with gentamicin was prepared in the usual fashion using a vacuum mixer. Cement was applied to the cut surface of the proximal tibia as well as along the undersurface of a size 6 rotating platform tibial component. Tibial component was positioned and impacted into place. Excess cement was removed using Personal assistant. Cement was then applied to the cut surfaces of the femur as well as along the posterior flanges of the size 6 femoral component. The femoral component was positioned and impacted into place. Excess cement was removed using Personal assistant. A 5 mm polyethylene trial was inserted and the knee was brought into full extension with steady axial compression applied. Finally, cement was applied to the backside of a 35 mm medialized dome patella and the patellar component was positioned and patellar clamp applied. Excess cement was removed using Personal assistant. After adequate curing of the cement, the tourniquet was deflated after a total tourniquet time of 94 minutes. Hemostasis was achieved using electrocautery. The knee was  irrigated with copious amounts of normal saline using pulsatile lavage followed by 450 ml of Surgiphor and then suctioned dry. 20 mL of 1.3% Exparel and 60 mL of 0.25% Marcaine in 40 mL of normal saline was injected along the posterior capsule, medial and lateral gutters, and along the arthrotomy site. A 5 mm stabilized rotating platform polyethylene insert was inserted and the knee was placed through a range of motion with excellent mediolateral soft tissue balancing appreciated and excellent patellar tracking noted. 2 medium drains were placed in the wound bed and brought out through separate stab incisions. The medial parapatellar portion of the incision was reapproximated using interrupted sutures of #1 Vicryl. Subcutaneous tissue was approximated in layers using first #0 Vicryl followed #2-0 Vicryl. The skin was approximated with skin staples. A sterile dressing was applied.  The patient tolerated the procedure well and was transported to the recovery room in stable condition.    Kourtnee Lahey P. Angie Fava., M.D.

## 2023-07-25 NOTE — Plan of Care (Signed)
  Problem: Pain Management: Goal: Pain level will decrease with appropriate interventions Outcome: Progressing   

## 2023-07-25 NOTE — Progress Notes (Signed)
Patient awake/alert x4. Able to move bil lower ext side to side, sensation decreased RLE, pulses intact +2 bilaterally. Dressing RLE c/d/I with polar care deployed. Denies pain at this time, PT to see patient in am, patient verbalizes understanding. Tolerating po fluids without event.

## 2023-07-25 NOTE — Anesthesia Preprocedure Evaluation (Addendum)
Anesthesia Evaluation  Patient identified by MRN, date of birth, ID band Patient awake    Reviewed: Allergy & Precautions, NPO status , Patient's Chart, lab work & pertinent test results  History of Anesthesia Complications Negative for: history of anesthetic complications  Airway Mallampati: IV   Neck ROM: Full    Dental  (+) Missing   Pulmonary former smoker (quit 1985)   Pulmonary exam normal breath sounds clear to auscultation       Cardiovascular hypertension, + Peripheral Vascular Disease (carotid stenosis)  Normal cardiovascular exam Rhythm:Regular Rate:Normal  ECG 03/05/23: normal   Neuro/Psych  PSYCHIATRIC DISORDERS Anxiety Depression    Chronic pain    GI/Hepatic ,GERD  Medicated and Controlled,,  Endo/Other  Hypothyroidism  Obesity   Renal/GU      Musculoskeletal  (+) Arthritis ,    Abdominal   Peds  Hematology  (+) Blood dyscrasia, anemia   Anesthesia Other Findings   Reproductive/Obstetrics                             Anesthesia Physical Anesthesia Plan  ASA: 2  Anesthesia Plan: Spinal and General   Post-op Pain Management:    Induction: Intravenous  PONV Risk Score and Plan: 3 and Propofol infusion, TIVA, Treatment may vary due to age or medical condition and Ondansetron  Airway Management Planned: Natural Airway and Nasal Cannula  Additional Equipment:   Intra-op Plan:   Post-operative Plan:   Informed Consent: I have reviewed the patients History and Physical, chart, labs and discussed the procedure including the risks, benefits and alternatives for the proposed anesthesia with the patient or authorized representative who has indicated his/her understanding and acceptance.       Plan Discussed with: CRNA  Anesthesia Plan Comments: (Plan for spinal and GA with natural airway, LMA/GETA backup.  Patient consented for risks of anesthesia including but not  limited to:  - adverse reactions to medications - damage to eyes, teeth, lips or other oral mucosa - nerve damage due to positioning  - sore throat or hoarseness - headache, bleeding, infection, nerve damage 2/2 spinal - damage to heart, brain, nerves, lungs, other parts of body or loss of life  Informed patient about role of CRNA in peri- and intra-operative care.  Patient voiced understanding.)        Anesthesia Quick Evaluation

## 2023-07-25 NOTE — Transfer of Care (Signed)
Immediate Anesthesia Transfer of Care Note  Patient: NONDA SOSINSKI  Procedure(s) Performed: COMPUTER ASSISTED TOTAL KNEE ARTHROPLASTY - RNFA (Right: Knee)  Patient Location: PACU  Anesthesia Type:General  Level of Consciousness: awake and alert   Airway & Oxygen Therapy: Patient Spontanous Breathing and Patient connected to nasal cannula oxygen  Post-op Assessment: Report given to RN and Post -op Vital signs reviewed and stable  Post vital signs: Reviewed and stable  Last Vitals:  Vitals Value Taken Time  BP 95/52 07/25/23 1526  Temp    Pulse 64 07/25/23 1527  Resp 12 07/25/23 1526  SpO2 98 % 07/25/23 1527  Vitals shown include unfiled device data.  Last Pain:  Vitals:   07/25/23 1006  TempSrc: Tympanic  PainSc: 2          Complications: No notable events documented.

## 2023-07-25 NOTE — Progress Notes (Signed)
PT Cancellation Note  Patient Details Name: Leslie Turner MRN: [id number] DOB: 04/23/1956   Cancelled Treatment:    Reason Eval/Treat Not Completed: Patient not medically ready: Spoke to nursing who requested PT be held at this time secondary to insufficient return of LE strength and sensation.  Will attempt to see pt at a future date/time as medically appropriate.    Ovidio Hanger PT, DPT 07/25/23, 4:21 PM

## 2023-07-25 NOTE — Progress Notes (Signed)
Patient is not able to walk the distance required to go the bathroom, or he/she is unable to safely negotiate stairs required to access the bathroom.  A 3in1 BSC will alleviate this problem   Leslie Turner, Jr. M.D.  

## 2023-07-25 NOTE — Interval H&P Note (Signed)
History and Physical Interval Note:  07/25/2023 11:05 AM  Leslie Turner  has presented today for surgery, with the diagnosis of PRIMARY OSTEOARTHRITIS OF RIGHT KNEE..  The various methods of treatment have been discussed with the patient and family. After consideration of risks, benefits and other options for treatment, the patient has consented to  Procedure(s): COMPUTER ASSISTED TOTAL KNEE ARTHROPLASTY - RNFA (Right) as a surgical intervention.  The patient's history has been reviewed, patient examined, no change in status, stable for surgery.  I have reviewed the patient's chart and labs.  Questions were answered to the patient's satisfaction.     Othella Slappey P Lin Glazier

## 2023-07-26 ENCOUNTER — Encounter: Payer: Self-pay | Admitting: Orthopedic Surgery

## 2023-07-26 DIAGNOSIS — M1711 Unilateral primary osteoarthritis, right knee: Secondary | ICD-10-CM | POA: Diagnosis not present

## 2023-07-26 MED ORDER — OXYCODONE HCL 5 MG PO TABS
ORAL_TABLET | ORAL | Status: AC
  Filled 2023-07-26: qty 1

## 2023-07-26 MED ORDER — LOSARTAN POTASSIUM 50 MG PO TABS
ORAL_TABLET | ORAL | Status: AC
  Filled 2023-07-26: qty 1

## 2023-07-26 MED ORDER — POTASSIUM CHLORIDE CRYS ER 20 MEQ PO TBCR
EXTENDED_RELEASE_TABLET | ORAL | Status: AC
  Filled 2023-07-26: qty 1

## 2023-07-26 MED ORDER — ACETAMINOPHEN 10 MG/ML IV SOLN
INTRAVENOUS | Status: AC
  Filled 2023-07-26: qty 100

## 2023-07-26 MED ORDER — PANTOPRAZOLE SODIUM 40 MG PO TBEC
DELAYED_RELEASE_TABLET | ORAL | Status: AC
  Filled 2023-07-26: qty 1

## 2023-07-26 MED ORDER — CELECOXIB 200 MG PO CAPS
ORAL_CAPSULE | ORAL | Status: AC
  Filled 2023-07-26: qty 1

## 2023-07-26 MED ORDER — CELECOXIB 200 MG PO CAPS: 200.0000 mg | ORAL_CAPSULE | Freq: Two times a day (BID) | ORAL | 1 refills | Status: DC

## 2023-07-26 MED ORDER — SERTRALINE HCL 50 MG PO TABS
ORAL_TABLET | ORAL | Status: AC
  Filled 2023-07-26: qty 1

## 2023-07-26 MED ORDER — ASPIRIN 81 MG PO CHEW
CHEWABLE_TABLET | ORAL | Status: AC
  Filled 2023-07-26: qty 1

## 2023-07-26 MED ORDER — ATORVASTATIN CALCIUM 20 MG PO TABS
ORAL_TABLET | ORAL | Status: AC
  Filled 2023-07-26: qty 2

## 2023-07-26 MED ORDER — OXYCODONE HCL 5 MG PO TABS: 5.0000 mg | ORAL_TABLET | ORAL | 0 refills | Status: DC | PRN

## 2023-07-26 MED ORDER — METOCLOPRAMIDE HCL 10 MG PO TABS
ORAL_TABLET | ORAL | Status: AC
  Filled 2023-07-26: qty 1

## 2023-07-26 MED ORDER — TRAMADOL HCL 50 MG PO TABS: 50.0000 mg | ORAL_TABLET | ORAL | 0 refills | Status: DC | PRN

## 2023-07-26 MED ORDER — HYDROCHLOROTHIAZIDE 25 MG PO TABS
ORAL_TABLET | ORAL | Status: AC
  Filled 2023-07-26: qty 1

## 2023-07-26 MED ORDER — CEFAZOLIN SODIUM-DEXTROSE 2-4 GM/100ML-% IV SOLN
INTRAVENOUS | Status: AC
  Filled 2023-07-26: qty 100

## 2023-07-26 MED ORDER — ASPIRIN 81 MG PO TBEC: 81.0000 mg | DELAYED_RELEASE_TABLET | Freq: Two times a day (BID) | ORAL | Status: AC

## 2023-07-26 MED ORDER — FERROUS SULFATE 325 (65 FE) MG PO TABS
ORAL_TABLET | ORAL | Status: AC
  Filled 2023-07-26: qty 1

## 2023-07-26 MED ORDER — SENNOSIDES-DOCUSATE SODIUM 8.6-50 MG PO TABS
ORAL_TABLET | ORAL | Status: AC
  Filled 2023-07-26: qty 1

## 2023-07-26 MED ORDER — MAGNESIUM HYDROXIDE 400 MG/5ML PO SUSP
ORAL | Status: AC
  Filled 2023-07-26: qty 30

## 2023-07-26 NOTE — Evaluation (Signed)
Physical Therapy Evaluation Patient Details Name: Leslie Turner MRN: [id number] DOB: 1956-01-10 Today's Date: 07/26/2023  History of Present Illness  Pt is a 67 y.o. female s/p R TKR (POD 1).  Clinical Impression  Pt is received in bed, she is agreeable to PT session. Pt performs bed mobility, transfers, and amb close sup A, and stair negotiation CGA for safety. Pt able to amb approx 275ft using RW with no difficulties or unsteadiness noted. Additionally, Pt able to negotiate steps using L side BUE support, step-to stepping pattern with no increase pain or discomfort to R knee. Educated and Pt performed TKR HEP protocol with no questions or difficulty. At this time, Pt is making good progress with mobility. Pt would benefit from skilled PT to address above deficits and promote optimal return to PLOF.      If plan is discharge home, recommend the following:     Can travel by private vehicle        Equipment Recommendations None recommended by PT  Recommendations for Other Services       Functional Status Assessment Patient has had a recent decline in their functional status and demonstrates the ability to make significant improvements in function in a reasonable and predictable amount of time.     Precautions / Restrictions Precautions Precautions: Knee;Fall Precaution Booklet Issued: Yes (comment) Precaution Comments: R TKR Restrictions Weight Bearing Restrictions: Yes RLE Weight Bearing: Weight bearing as tolerated      Mobility  Bed Mobility Overal bed mobility: Modified Independent             General bed mobility comments: required use of hand rails but otherwise mod I; no need for cuing    Transfers Overall transfer level: Needs assistance Equipment used: Rolling walker (2 wheels) Transfers: Sit to/from Stand Sit to Stand: Supervision           General transfer comment: able to perform STS close sup A for safety but no cuing required     Ambulation/Gait Ambulation/Gait assistance: Supervision Gait Distance (Feet): 250 Feet Assistive device: Rolling walker (2 wheels) Gait Pattern/deviations: Step-through pattern, WFL(Within Functional Limits), Decreased stride length Gait velocity: slightly decreased     General Gait Details: slight decreased stride length but no reports of R knee discomfort/pain  Stairs Stairs: Yes Stairs assistance: Contact guard assist Stair Management: One rail Left, Step to pattern Number of Stairs: 4 General stair comments: demonstrated and performed stair negotiation with no difficulty  Wheelchair Mobility     Tilt Bed    Modified Rankin (Stroke Patients Only)       Balance Overall balance assessment: Needs assistance Sitting-balance support: Feet supported Sitting balance-Leahy Scale: Good Sitting balance - Comments: able to maintain seated EOB balance during functional activities   Standing balance support: During functional activity, Bilateral upper extremity supported Standing balance-Leahy Scale: Good Standing balance comment: able to maintain static standing balance with RW during functional activities                             Pertinent Vitals/Pain Pain Assessment Pain Assessment: No/denies pain    Home Living Family/patient expects to be discharged to:: Private residence Living Arrangements: Spouse/significant other Available Help at Discharge: Family Type of Home: House Home Access: Stairs to enter Entrance Stairs-Rails: Left Entrance Stairs-Number of Steps: 3   Home Layout: One level Home Equipment: Agricultural consultant (2 wheels)      Prior Function Prior Level of Function :  Independent/Modified Independent             Mobility Comments: Pt reports she was able to ambulate prior to admission without adaptive equipment ADLs Comments: Pt reports independence in self care and IADL tasks prior to admission     Extremity/Trunk Assessment    Upper Extremity Assessment Upper Extremity Assessment: Overall WFL for tasks assessed    Lower Extremity Assessment Lower Extremity Assessment: Generalized weakness       Communication   Communication Communication: No apparent difficulties  Cognition Arousal: Alert Behavior During Therapy: WFL for tasks assessed/performed Overall Cognitive Status: Within Functional Limits for tasks assessed                                 General Comments: AO x4; pleasant and cooperative during therapy        General Comments      Exercises Total Joint Exercises Goniometric ROM: R knee flex: 80 deg, R knee ext: +5 deg Other Exercises Other Exercises: TKR HEP protocol- educated and Pt performed exercises   Assessment/Plan    PT Assessment Patient needs continued PT services  PT Problem List Decreased strength;Decreased mobility;Decreased range of motion;Decreased activity tolerance;Decreased balance;Pain       PT Treatment Interventions DME instruction;Gait training;Stair training;Functional mobility training;Therapeutic activities;Therapeutic exercise;Balance training;Neuromuscular re-education    PT Goals (Current goals can be found in the Care Plan section)  Acute Rehab PT Goals Patient Stated Goal: to go home PT Goal Formulation: With patient Time For Goal Achievement: 08/09/23 Potential to Achieve Goals: Good    Frequency Min 1X/week     Co-evaluation               AM-PAC PT "6 Clicks" Mobility  Outcome Measure Help needed turning from your back to your side while in a flat bed without using bedrails?: None Help needed moving from lying on your back to sitting on the side of a flat bed without using bedrails?: None Help needed moving to and from a bed to a chair (including a wheelchair)?: None Help needed standing up from a chair using your arms (e.g., wheelchair or bedside chair)?: None Help needed to walk in hospital room?: A Little Help needed  climbing 3-5 steps with a railing? : A Little 6 Click Score: 22    End of Session   Activity Tolerance: Patient tolerated treatment well;No increased pain Patient left: in bed;with call bell/phone within reach;with nursing/sitter in room;with SCD's reapplied;Other (comment) (polar care reapplied) Nurse Communication: Mobility status PT Visit Diagnosis: Unsteadiness on feet (R26.81);Muscle weakness (generalized) (M62.81);Pain Pain - Right/Left: Right Pain - part of body: Knee    Time: 1884-1660 PT Time Calculation (min) (ACUTE ONLY): 13 min   Charges:   PT Evaluation $PT Eval Low Complexity: 1 Low   PT General Charges $$ ACUTE PT VISIT: 1 Visit         Elmon Else, SPT   Devron Cohick 07/26/2023, 10:49 AM

## 2023-07-26 NOTE — Progress Notes (Signed)
DISCHARGE NOTE:  Pt given discharge instructions, and verbalized understanding. TED hose on both legs. Pt wheeled to car by staff. Husband providing transportation home.

## 2023-07-26 NOTE — Discharge Summary (Signed)
Physician Discharge Summary  Subjective: 1 Day Post-Op Procedure(s) (LRB): COMPUTER ASSISTED TOTAL KNEE ARTHROPLASTY - RNFA (Right) Patient reports pain as 2 on 0-10 scale.   Patient seen in rounds with Dr. Ernest Pine. Patient is well, and has had no acute complaints or problems.  Denies any CP, SOB, N/B, fevers or chills. We will continue therapy today.  Patient is ready to go home  Physician Discharge Summary  Patient ID: Leslie Turner MRN: [id number] DOB/AGE: 1956/04/15 67 y.o.  Admit date: 07/25/2023 Discharge date: 07/26/2023  Admission Diagnoses:  Discharge Diagnoses:  Principal Problem:   Total knee replacement status   Discharged Condition: good  Hospital Course: Patient presented to the hospital on 07/25/2023 for elective right total knee arthroplasty performed by Dr. Ernest Pine.  Patient was given 1 g of TXA and 2 g of Ancef perioperatively.  She tolerated the surgery well without any complications, see operative notes for details below.  Postoperatively, the patient did well.  She was able to pass her PT protocols postop day 1 without any issues.  She was able to void her bladder.  Her drain was pulled without any issue, intact.  Her vital signs are stable, denies any shortness of breath, chest pain, N/V, fevers or chills.  Patient is stable for discharge home.  PROCEDURE:  Right total knee arthroplasty using computer-assisted navigation   SURGEON:  Jena Gauss. M.D.   ASSISTANT:  Gean Birchwood, PA-C (present and scrubbed throughout the case, critical for assistance with exposure, retraction, instrumentation, and closure)   ANESTHESIA: spinal   ESTIMATED BLOOD LOSS: 50 mL   FLUIDS REPLACED: 1400 mL of crystalloid   TOURNIQUET TIME: 94 minutes   DRAINS: 2 medium Hemovac drains   SOFT TISSUE RELEASES: Anterior cruciate ligament, posterior cruciate ligament, deep medial collateral ligament, patellofemoral ligament   IMPLANTS UTILIZED: DePuy Attune size 6 posterior  stabilized femoral component (cemented), size 6 rotating platform tibial component (cemented), 35 mm medialized dome patella (cemented), and a 5 mm stabilized rotating platform polyethylene insert.  Treatments: None  Discharge Exam: Blood pressure 115/75, pulse (!) 58, temperature 97.8 F (36.6 C), temperature source Temporal, resp. rate 16, height 5' 4.5" (1.638 m), weight 88.5 kg, SpO2 99%.   Disposition: Home   Allergies as of 07/26/2023   No Known Allergies      Medication List     TAKE these medications    aspirin EC 81 MG tablet Take 1 tablet (81 mg total) by mouth in the morning and at bedtime. Swallow whole.   atorvastatin 40 MG tablet Commonly known as: LIPITOR Take 1 tablet (40 mg total) by mouth daily.   celecoxib 200 MG capsule Commonly known as: CELEBREX Take 1 capsule (200 mg total) by mouth 2 (two) times daily.   CENTRUM SILVER 50+WOMEN PO Take 1 tablet by mouth daily.   cholecalciferol 1000 units tablet Commonly known as: VITAMIN D Take 1,000 Units by mouth daily.   hydrochlorothiazide 25 MG tablet Commonly known as: HYDRODIURIL Take 0.5 tablets (12.5 mg total) by mouth daily.   levothyroxine 112 MCG tablet Commonly known as: SYNTHROID Take 1 tablet (112 mcg total) by mouth daily before breakfast.   losartan 50 MG tablet Commonly known as: COZAAR Take 1 tablet (50 mg total) by mouth daily.   omeprazole 20 MG capsule Commonly known as: PRILOSEC Take 20 mg by mouth daily.   oxyCODONE 5 MG immediate release tablet Commonly known as: Oxy IR/ROXICODONE Take 1 tablet (5 mg total) by mouth every  4 (four) hours as needed for moderate pain (pain score 4-6).   potassium chloride 10 MEQ tablet Commonly known as: KLOR-CON M Take 2 tablets (20 mEq total) by mouth 2 (two) times daily for 5 days.   sertraline 100 MG tablet Commonly known as: ZOLOFT Take 0.5 tablets (50 mg total) by mouth daily.   traMADol 50 MG tablet Commonly known as:  ULTRAM Take 1-2 tablets (50-100 mg total) by mouth every 4 (four) hours as needed for moderate pain.               Durable Medical Equipment  (From admission, onward)           Start     Ordered   07/25/23 1624  DME Walker rolling  Once       Question:  Patient needs a walker to treat with the following condition  Answer:  Total knee replacement status   07/25/23 1624   07/25/23 1624  DME Bedside commode  Once       Comments: Patient is not able to walk the distance required to go the bathroom, or he/she is unable to safely negotiate stairs required to access the bathroom.  A 3in1 BSC will alleviate this problem  Question:  Patient needs a bedside commode to treat with the following condition  Answer:  Total knee replacement status   07/25/23 1624            Follow-up Information     Rayburn Go, PA-C Follow up on 08/09/2023.   Specialty: Orthopedic Surgery Why: at 10:45am Contact information: [address] [place] [address] [phone]         Donato Heinz, MD Follow up on 09/06/2023.   Specialty: Orthopedic Surgery Why: at 2:30pm Contact information: [address] [facility] [place] [address] [phone]                 Signed: Gean Birchwood 07/26/2023, 7:50 AM   Objective: Vital signs in last 24 hours: Temp:  [96.9 F (36.1 C)-99.4 F (37.4 C)] 97.8 F (36.6 C) (09/26 0531) Pulse Rate:  [51-75] 58 (09/26 0531) Resp:  [14-22] 16 (09/26 0531) BP: (95-161)/(52-79) 115/75 (09/26 0531) SpO2:  [95 %-100 %] 99 % (09/26 0531) Weight:  [88.5 kg] 88.5 kg (09/25 1006)  Intake/Output from previous day:  Intake/Output Summary (Last 24 hours) at 07/26/2023 0750 Last data filed at 07/26/2023 0600 Gross per 24 hour  Intake 2799.57 ml  Output 663 ml  Net 2136.57 ml    Intake/Output this shift: No intake/output data recorded.  Labs: Recent Labs    07/25/23 1034  HGB 13.9   Recent Labs     07/25/23 1034  HCT 41.0   Recent Labs    07/23/23 1319 07/25/23 1034  NA 142 137  K 4.5 4.7  CL 106 107  CO2 22  --   BUN 12 11  CREATININE 0.93 0.90  GLUCOSE 99 117*  CALCIUM 9.3  --    No results for input(s): "LABPT", "INR" in the last 72 hours.  EXAM: General - Patient is Alert, Appropriate, and Oriented Extremity - Neurologically intact ABD soft Neurovascular intact Sensation intact distally Intact pulses distally Dorsiflexion/Plantar flexion intact No cellulitis present Compartment soft Dressing - dressing C/D/I and no drainage Motor Function - intact, moving foot and toes well on exam.  Patient able to plantar and dorsiflex with good strength and range of motion.  She is also able to straight leg  raise without any difficulty or assistance.  She is neurovascularly intact all dermatomes down her right lower extremity.  Posterior tibial pulses appreciated 2+ JP Drain pulled without difficulty. Intact  Assessment/Plan: 1 Day Post-Op Procedure(s) (LRB): COMPUTER ASSISTED TOTAL KNEE ARTHROPLASTY - RNFA (Right) Procedure(s) (LRB): COMPUTER ASSISTED TOTAL KNEE ARTHROPLASTY - RNFA (Right) Past Medical History:  Diagnosis Date   Anemia    Anxiety    Aortic atherosclerosis (HCC)    Carotid stenosis    Depression    Dyspnea    GERD (gastroesophageal reflux disease)    Hyperlipidemia    Hypertension    Hypothyroidism    Metabolic syndrome    Microscopic hematuria    Pain in knee    Primary osteoarthritis of left knee    Vitamin D deficiency    Principal Problem:   Total knee replacement status  Estimated body mass index is 32.95 kg/m as calculated from the following:   Height as of this encounter: 5' 4.5" (1.638 m).   Weight as of this encounter: 88.5 kg.  Patient has passed all of her PT protocols and is stable for discharge home.  Look to continue with home health physical therapy working on gait, ROM and strength of the right lower extremity  Discussed  with the patient continuing to utilize Polar Care   Patient will use bone foam in 20-30 minute intervals   Patient will wear TED hose bilaterally to help prevent DVT and clot formation   Discussed the Aquacel bandage.  This bandage will stay in place 7 days postoperatively.  Can be replaced with honeycomb bandages that will be sent home with the patient   Discussed sending the patient home with tramadol and oxycodone for as needed pain management.  Patient will also be sent home with Celebrex to help with swelling and inflammation.  Patient will take an 81 mg aspirin twice daily for DVT prophylaxis   JP drain removed without difficulty, intact   Weight-Bearing as tolerated to right leg   Patient will follow-up with [facility] clinic orthopedics in 2 weeks for staple removal and reevaluation  Diet - Regular diet Follow up - in 2 weeks Activity - WBAT Disposition - Home Condition Upon Discharge - Good DVT Prophylaxis - Aspirin and TED hose  Danise Edge, PA-C Orthopaedic Surgery 07/26/2023, 7:50 AM

## 2023-07-26 NOTE — TOC Progression Note (Signed)
Transition of Care [facility]) - Progression Note    Patient Details  Name: Leslie Turner MRN: [id number] Date of Birth: 06-21-1956  [facility] of [facility]) CM/SW Contact  Marlowe Sax, RN Phone Number: 07/26/2023, 8:45 AM  Clinical Narrative:     The patient is set up with [facility] for Home health services prior to surgery by the surgeons office She received a RW and 3 in 1 in May, Spouse to provide transportation  Expected Discharge Plan: Home w Home Health Services Barriers to Discharge: No Barriers Identified  Expected Discharge Plan and Services   Discharge Planning Services: CM Consult   Living arrangements for the past 2 months: Single Family Home Expected Discharge Date: 07/26/23                 DME Agency: NA       HH Arranged: PT, OT HH Agency: [facility] Date HH Agency Contacted: 07/26/23 Time HH Agency Contacted: 0845 Representative spoke with at [facility] Agency: [place]   Social Determinants of Health (SDOH) Interventions SDOH Screenings   Food Insecurity: No Food Insecurity (07/25/2023)  Housing: Low Risk  (07/25/2023)  Transportation Needs: No Transportation Needs (07/25/2023)  Utilities: Not At Risk (07/25/2023)  Alcohol Screen: Low Risk  (08/31/2022)  Depression (PHQ2-9): Low Risk  (02/19/2023)  Financial Resource Strain: Low Risk  (08/31/2022)  Physical Activity: Insufficiently Active (08/31/2022)  Social Connections: Moderately Integrated (08/31/2022)  Stress: No Stress Concern Present (08/31/2022)  Tobacco Use: Medium Risk (07/25/2023)    Readmission Risk Interventions     No data to display

## 2023-07-26 NOTE — Progress Notes (Signed)
Subjective: 1 Day Post-Op Procedure(s) (LRB): COMPUTER ASSISTED TOTAL KNEE ARTHROPLASTY - RNFA (Right) Patient reports pain as 2 on 0-10 scale.   Patient seen in rounds with Dr. Ernest Pine. Patient is well, and has had no acute complaints or problems.  Denies any CP, SOB, N/B, fevers or chills. We will start therapy today.  Plan is to go Home after hospital stay.  Objective: Vital signs in last 24 hours: Temp:  [96.9 F (36.1 C)-99.4 F (37.4 C)] 97.8 F (36.6 C) (09/26 0531) Pulse Rate:  [51-75] 58 (09/26 0531) Resp:  [14-22] 16 (09/26 0531) BP: (95-161)/(52-79) 115/75 (09/26 0531) SpO2:  [95 %-100 %] 99 % (09/26 0531) Weight:  [88.5 kg] 88.5 kg (09/25 1006)  Intake/Output from previous day:  Intake/Output Summary (Last 24 hours) at 07/26/2023 0751 Last data filed at 07/26/2023 0600 Gross per 24 hour  Intake 2799.57 ml  Output 663 ml  Net 2136.57 ml    Intake/Output this shift: No intake/output data recorded.  Labs: Recent Labs    07/25/23 1034  HGB 13.9   Recent Labs    07/25/23 1034  HCT 41.0   Recent Labs    07/23/23 1319 07/25/23 1034  NA 142 137  K 4.5 4.7  CL 106 107  CO2 22  --   BUN 12 11  CREATININE 0.93 0.90  GLUCOSE 99 117*  CALCIUM 9.3  --    No results for input(s): "LABPT", "INR" in the last 72 hours.  EXAM General - Patient is Alert, Appropriate, and Oriented Extremity - Neurologically intact ABD soft Neurovascular intact Sensation intact distally Intact pulses distally Dorsiflexion/Plantar flexion intact No cellulitis present Compartment soft Dressing - dressing C/D/I and no drainage Motor Function - intact, moving foot and toes well on exam.  Patient able to plantar and dorsiflex with good strength and range of motion.  She is also able to straight leg raise without any difficulty or assistance.  She is neurovascularly intact all dermatomes down her right lower extremity.  Posterior tibial pulses appreciated 2+ JP Drain pulled without  difficulty. Intact  Past Medical History:  Diagnosis Date   Anemia    Anxiety    Aortic atherosclerosis (HCC)    Carotid stenosis    Depression    Dyspnea    GERD (gastroesophageal reflux disease)    Hyperlipidemia    Hypertension    Hypothyroidism    Metabolic syndrome    Microscopic hematuria    Pain in knee    Primary osteoarthritis of left knee    Vitamin D deficiency     Assessment/Plan: 1 Day Post-Op Procedure(s) (LRB): COMPUTER ASSISTED TOTAL KNEE ARTHROPLASTY - RNFA (Right) Principal Problem:   Total knee replacement status  Estimated body mass index is 32.95 kg/m as calculated from the following:   Height as of this encounter: 5' 4.5" (1.638 m).   Weight as of this encounter: 88.5 kg. Advance diet Up with therapy  Patient will continue to work with physical therapy to pass postoperative PT protocols, ROM and strengthening  Discussed with the patient continuing to utilize Polar Care  Patient will use bone foam in 20-30 minute intervals  Patient will wear TED hose bilaterally to help prevent DVT and clot formation  Discussed the Aquacel bandage.  This bandage will stay in place 7 days postoperatively.  Can be replaced with honeycomb bandages that will be sent home with the patient  Discussed sending the patient home with tramadol and oxycodone for as needed pain management.  Patient  will also be sent home with Celebrex to help with swelling and inflammation.  Patient will take an 81 mg aspirin twice daily for DVT prophylaxis  JP drain removed without difficulty, intact  Weight-Bearing as tolerated to right leg  Patient will follow-up with [facility] clinic orthopedics in 2 weeks for staple removal and reevaluation  Rayburn Go, PA-C [facility] Orthopaedics 07/26/2023, 7:51 AM

## 2023-07-26 NOTE — Progress Notes (Signed)
OT Cancellation Note  Patient Details Name: Leslie Turner MRN: [id number] DOB: 1956/08/27   Cancelled Treatment:    Reason Eval/Treat Not Completed: OT screened, no needs identified, will sign off. Pt doing very well with PT this morning and with prior L TKA in May. She declined OT intervention at this time. OT to complete orders.   Jackquline Denmark, MS, OTR/L , CBIS ascom [phone]  07/26/23, 9:47 AM

## 2023-07-26 NOTE — Anesthesia Postprocedure Evaluation (Signed)
Anesthesia Post Note  Patient: Leslie Turner  Procedure(s) Performed: COMPUTER ASSISTED TOTAL KNEE ARTHROPLASTY - RNFA (Right: Knee)  Patient location during evaluation: PACU Anesthesia Type: Spinal Level of consciousness: awake and alert, oriented and patient cooperative Pain management: pain level controlled Vital Signs Assessment: post-procedure vital signs reviewed and stable Respiratory status: spontaneous breathing, nonlabored ventilation and respiratory function stable Cardiovascular status: blood pressure returned to baseline and stable Postop Assessment: adequate PO intake, no headache, no backache and spinal receding Anesthetic complications: no   No notable events documented.   Last Vitals:  Vitals:   07/26/23 0056 07/26/23 0531  BP: 135/65 115/75  Pulse: (!) 54 (!) 58  Resp: 16 16  Temp: 36.7 C 36.6 C  SpO2: 96% 99%    Last Pain:  Vitals:   07/26/23 0531  TempSrc: Temporal  PainSc:                  Reed Breech

## 2023-07-26 NOTE — Plan of Care (Signed)
  Problem: Activity: Goal: Ability to avoid complications of mobility impairment will improve Outcome: Progressing Goal: Range of joint motion will improve Outcome: Progressing   Problem: Pain Management: Goal: Pain level will decrease with appropriate interventions Outcome: Progressing   

## 2023-07-26 NOTE — Care Management Obs Status (Signed)
MEDICARE OBSERVATION STATUS NOTIFICATION   Patient Details  Name: GISELA TRUSSEL MRN: [id number] Date of Birth: [birth year]   Medicare Observation Status Notification Given:  Yes    Marlowe Sax, RN 07/26/2023, 8:48 AM

## 2023-08-01 ENCOUNTER — Encounter: Payer: Self-pay | Admitting: Nurse Practitioner

## 2023-08-01 NOTE — Telephone Encounter (Signed)
Called and scheduled patient on 08/03/2023 @ 3:00 pm.

## 2023-08-03 ENCOUNTER — Ambulatory Visit (INDEPENDENT_AMBULATORY_CARE_PROVIDER_SITE_OTHER): Payer: Medicare Other | Admitting: Nurse Practitioner

## 2023-08-03 ENCOUNTER — Encounter: Payer: Self-pay | Admitting: Nurse Practitioner

## 2023-08-03 VITALS — BP 104/64 | HR 70 | Temp 97.6°F | Ht 64.5 in | Wt 201.0 lb

## 2023-08-03 DIAGNOSIS — N898 Other specified noninflammatory disorders of vagina: Secondary | ICD-10-CM | POA: Diagnosis not present

## 2023-08-03 DIAGNOSIS — R8281 Pyuria: Secondary | ICD-10-CM | POA: Diagnosis not present

## 2023-08-03 LAB — URINALYSIS, ROUTINE W REFLEX MICROSCOPIC
Bilirubin, UA: NEGATIVE
Glucose, UA: NEGATIVE
Leukocytes,UA: NEGATIVE
Nitrite, UA: NEGATIVE
RBC, UA: NEGATIVE
Specific Gravity, UA: >1.03 — ABNORMAL HIGH (ref 1.005–1.030)
Urobilinogen, Ur: 1 mg/dL (ref 0.2–1.0)
pH, UA: 6 (ref 5.0–7.5)

## 2023-08-03 LAB — WET PREP FOR TRICH, YEAST, CLUE
Clue Cell Exam: NEGATIVE
Trichomonas Exam: NEGATIVE
Yeast Exam: NEGATIVE

## 2023-08-03 LAB — MICROSCOPIC EXAMINATION

## 2023-08-03 MED ORDER — CEFPODOXIME PROXETIL 100 MG PO TABS: 100.0000 mg | ORAL_TABLET | Freq: Two times a day (BID) | ORAL | 0 refills | Status: AC

## 2023-08-03 MED ORDER — FLUCONAZOLE 150 MG PO TABS: 150.0000 mg | ORAL_TABLET | Freq: Once | ORAL | 0 refills | Status: AC

## 2023-08-03 NOTE — Progress Notes (Signed)
BP 104/64   Pulse 70   Temp 97.6 F (36.4 C)   Ht 5' 4.5" (1.638 m)   Wt 201 lb (91.2 kg)   LMP  (LMP Unknown)   SpO2 97%   BMI 33.97 kg/m    Subjective:    Patient ID: Leslie Turner, female    DOB: 12-02-1955, 67 y.o.   MRN: [id number]  HPI: Leslie Turner is a 67 y.o. female  Chief Complaint  Patient presents with   Urinary Tract Infection    Pt stated--after potassium  when urinating have burning sensation, frequency urinating, itching---3-4 days   URINARY & VAGINAL SYMPTOMS Started on Monday or Tuesday with symptoms. These may have started after taking potassium she reports. Having vaginal itching.  Had recent knee replacement right side -- had foley catheter during surgery. Dysuria: yes Urinary frequency: yes Urgency: yes Small volume voids: no Symptom severity: yes Urinary incontinence: yes dribbling Foul odor: no Hematuria: no Abdominal pain: no Back pain: no Suprapubic pain/pressure: no Flank pain: no Fever:  no Vomiting: a little nausea only Status: stable Previous urinary tract infection: yes Recurrent urinary tract infection: no Sexual activity: No sexually active History of sexually transmitted disease: no Treatments attempted: Monistat    Relevant past medical, surgical, family and social history reviewed and updated as indicated. Interim medical history since our last visit reviewed. Allergies and medications reviewed and updated.  Review of Systems  Constitutional:  Negative for activity change, appetite change, diaphoresis, fatigue and fever.  Respiratory:  Negative for cough, chest tightness and shortness of breath.   Cardiovascular:  Negative for chest pain, palpitations and leg swelling.  Gastrointestinal:  Positive for nausea. Negative for abdominal distention, abdominal pain, constipation, diarrhea and vomiting.  Genitourinary:  Positive for dysuria, frequency and urgency. Negative for hematuria and vaginal discharge.  Neurological:  Negative.   Psychiatric/Behavioral: Negative.      Per HPI unless specifically indicated above     Objective:    BP 104/64   Pulse 70   Temp 97.6 F (36.4 C)   Ht 5' 4.5" (1.638 m)   Wt 201 lb (91.2 kg)   LMP  (LMP Unknown)   SpO2 97%   BMI 33.97 kg/m   Wt Readings from Last 3 Encounters:  08/03/23 201 lb (91.2 kg)  07/25/23 195 lb (88.5 kg)  07/18/23 198 lb 10.2 oz (90.1 kg)    Physical Exam Vitals and nursing note reviewed.  Constitutional:      General: She is awake. She is not in acute distress.    Appearance: She is well-developed and well-groomed. She is obese. She is not ill-appearing or toxic-appearing.  HENT:     Head: Normocephalic.     Right Ear: Hearing and external ear normal.     Left Ear: Hearing and external ear normal.  Eyes:     General: Lids are normal.        Right eye: No discharge.        Left eye: No discharge.     Conjunctiva/sclera: Conjunctivae normal.     Pupils: Pupils are equal, round, and reactive to light.  Neck:     Thyroid: No thyromegaly.     Vascular: No carotid bruit.  Cardiovascular:     Rate and Rhythm: Normal rate and regular rhythm.     Heart sounds: Normal heart sounds. No murmur heard.    No gallop.  Pulmonary:     Effort: Pulmonary effort is normal. No accessory muscle  usage or respiratory distress.     Breath sounds: Normal breath sounds.  Abdominal:     General: Bowel sounds are normal. There is no distension.     Palpations: Abdomen is soft.     Tenderness: There is no abdominal tenderness. There is no right CVA tenderness or left CVA tenderness.  Musculoskeletal:     Cervical back: Normal range of motion and neck supple.     Right lower leg: No edema.     Left lower leg: No edema.  Lymphadenopathy:     Cervical: No cervical adenopathy.  Skin:    General: Skin is warm and dry.  Neurological:     Mental Status: She is alert and oriented to person, place, and time.     Deep Tendon Reflexes: Reflexes are normal  and symmetric.     Reflex Scores:      Brachioradialis reflexes are 2+ on the right side and 2+ on the left side.      Patellar reflexes are 2+ on the right side and 2+ on the left side. Psychiatric:        Attention and Perception: Attention normal.        Mood and Affect: Mood normal.        Speech: Speech normal.        Behavior: Behavior normal. Behavior is cooperative.        Thought Content: Thought content normal.    Results for orders placed or performed during the hospital encounter of 07/25/23  I-STAT, chem 8  Result Value Ref Range   Sodium 137 135 - 145 mmol/L   Potassium 4.7 3.5 - 5.1 mmol/L   Chloride 107 98 - 111 mmol/L   BUN 11 8 - 23 mg/dL   Creatinine, Ser 1.91 0.44 - 1.00 mg/dL   Glucose, Bld 478 (H) 70 - 99 mg/dL   Calcium, Ion 2.95 (L) 1.15 - 1.40 mmol/L   TCO2 21 (L) 22 - 32 mmol/L   Hemoglobin 13.9 12.0 - 15.0 g/dL   HCT 62.1 30.8 - 65.7 %      Assessment & Plan:   Problem List Items Addressed This Visit       Other   Vaginal irritation - Primary    Acute for a few days, had foley catheter during surgery recently.  Wet prep overall normal, but did use Monistat recently.  UA showing few bacteria and WBC.  Due to risk of urine infection with recent foley will start Vantin 100 MG BID for 5 days + Diflucan x 1 dose.  Urine send for culture, will adjust regimen as needed based on this.        Relevant Orders   WET PREP FOR TRICH, YEAST, CLUE   Urinalysis, Routine w reflex microscopic   Other Visit Diagnoses     Pyuria       Urine sent for culture.   Relevant Orders   Urine Culture        Follow up plan: Return for as scheduled October 22nd.

## 2023-08-03 NOTE — Assessment & Plan Note (Signed)
Acute for a few days, had foley catheter during surgery recently.  Wet prep overall normal, but did use Monistat recently.  UA showing few bacteria and WBC.  Due to risk of urine infection with recent foley will start Vantin 100 MG BID for 5 days + Diflucan x 1 dose.  Urine send for culture, will adjust regimen as needed based on this.

## 2023-08-03 NOTE — Patient Instructions (Signed)
Urinary Tract Infection, Adult A urinary tract infection (UTI) is an infection of any part of the urinary tract. The urinary tract includes: The kidneys. The ureters. The bladder. The urethra. These organs make, store, and get rid of pee (urine) in the body. What are the causes? This infection is caused by germs (bacteria) in your genital area. These germs grow and cause swelling (inflammation) of your urinary tract. What increases the risk? The following factors may make you more likely to develop this condition: Using a small, thin tube (catheter) to drain pee. Not being able to control when you pee or poop (incontinence). Being female. If you are female, these things can increase the risk: Using these methods to prevent pregnancy: A medicine that kills sperm (spermicide). A device that blocks sperm (diaphragm). Having low levels of a female hormone (estrogen). Being pregnant. You are more likely to develop this condition if: You have genes that add to your risk. You are sexually active. You take antibiotic medicines. You have trouble peeing because of: A prostate that is bigger than normal, if you are female. A blockage in the part of your body that drains pee from the bladder. A kidney stone. A nerve condition that affects your bladder. Not getting enough to drink. Not peeing often enough. You have other conditions, such as: Diabetes. A weak disease-fighting system (immune system). Sickle cell disease. Gout. Injury of the spine. What are the signs or symptoms? Symptoms of this condition include: Needing to pee right away. Peeing small amounts often. Pain or burning when peeing. Blood in the pee. Pee that smells bad or not like normal. Trouble peeing. Pee that is cloudy. Fluid coming from the vagina, if you are female. Pain in the belly or lower back. Other symptoms include: Vomiting. Not feeling hungry. Feeling mixed up (confused). This may be the first symptom in  older adults. Being tired and grouchy (irritable). A fever. Watery poop (diarrhea). How is this treated? Taking antibiotic medicine. Taking other medicines. Drinking enough water. In some cases, you may need to see a specialist. Follow these instructions at home:  Medicines Take over-the-counter and prescription medicines only as told by your doctor. If you were prescribed an antibiotic medicine, take it as told by your doctor. Do not stop taking it even if you start to feel better. General instructions Make sure you: Pee until your bladder is empty. Do not hold pee for a long time. Empty your bladder after sex. Wipe from front to back after peeing or pooping if you are a female. Use each tissue one time when you wipe. Drink enough fluid to keep your pee pale yellow. Keep all follow-up visits. Contact a doctor if: You do not get better after 1-2 days. Your symptoms go away and then come back. Get help right away if: You have very bad back pain. You have very bad pain in your lower belly. You have a fever. You have chills. You feeling like you will vomit or you vomit. Summary A urinary tract infection (UTI) is an infection of any part of the urinary tract. This condition is caused by germs in your genital area. There are many risk factors for a UTI. Treatment includes antibiotic medicines. Drink enough fluid to keep your pee pale yellow. This information is not intended to replace advice given to you by your health care provider. Make sure you discuss any questions you have with your health care provider. Document Revised: 05/23/2020 Document Reviewed: 05/28/2020 Elsevier Patient Education    2024 Elsevier Inc.  

## 2023-08-06 ENCOUNTER — Encounter: Payer: Self-pay | Admitting: Nurse Practitioner

## 2023-08-15 ENCOUNTER — Encounter: Payer: Self-pay | Admitting: Nurse Practitioner

## 2023-08-15 MED ORDER — FLUCONAZOLE 150 MG PO TABS: 150.0000 mg | ORAL_TABLET | Freq: Once | ORAL | 0 refills | Status: AC

## 2023-08-19 NOTE — Patient Instructions (Signed)
Be Involved in Caring For Your Health:  Taking Medications When medications are taken as directed, they can greatly improve your health. But if they are not taken as prescribed, they may not work. In some cases, not taking them correctly can be harmful. To help ensure your treatment remains effective and safe, understand your medications and how to take them. Bring your medications to each visit for review by your provider.  Your lab results, notes, and after visit summary will be available on My Chart. We strongly encourage you to use this feature. If lab results are abnormal the clinic will contact you with the appropriate steps. If the clinic does not contact you assume the results are satisfactory. You can always view your results on My Chart. If you have questions regarding your health or results, please contact the clinic during office hours. You can also ask questions on My Chart.  We at [facility] are grateful that you chose us to provide your care. We strive to provide evidence-based and compassionate care and are always looking for feedback. If you get a survey from the clinic please complete this so we can hear your opinions.  DASH Eating Plan DASH stands for Dietary Approaches to Stop Hypertension. The DASH eating plan is a healthy eating plan that has been shown to: Lower high blood pressure (hypertension). Reduce your risk for type 2 diabetes, heart disease, and stroke. Help with weight loss. What are tips for following this plan? Reading food labels Check food labels for the amount of salt (sodium) per serving. Choose foods with less than 5 percent of the Daily Value (DV) of sodium. In general, foods with less than 300 milligrams (mg) of sodium per serving fit into this eating plan. To find whole grains, look for the word "whole" as the first word in the ingredient list. Shopping Buy products labeled as "low-sodium" or "no salt added." Buy fresh foods. Avoid canned  foods and pre-made or frozen meals. Cooking Try not to add salt when you cook. Use salt-free seasonings or herbs instead of table salt or sea salt. Check with your health care provider or pharmacist before using salt substitutes. Do not fry foods. Cook foods in healthy ways, such as baking, boiling, grilling, roasting, or broiling. Cook using oils that are good for your heart. These include olive, canola, avocado, soybean, and sunflower oil. Meal planning  Eat a balanced diet. This should include: 4 or more servings of fruits and 4 or more servings of vegetables each day. Try to fill half of your plate with fruits and vegetables. 6-8 servings of whole grains each day. 6 or less servings of lean meat, poultry, or fish each day. 1 oz is 1 serving. A 3 oz (85 g) serving of meat is about the same size as the palm of your hand. One egg is 1 oz (28 g). 2-3 servings of low-fat dairy each day. One serving is 1 cup (237 mL). 1 serving of nuts, seeds, or beans 5 times each week. 2-3 servings of heart-healthy fats. Healthy fats called omega-3 fatty acids are found in foods such as walnuts, flaxseeds, fortified milks, and eggs. These fats are also found in cold-water fish, such as sardines, salmon, and mackerel. Limit how much you eat of: Canned or prepackaged foods. Food that is high in trans fat, such as fried foods. Food that is high in saturated fat, such as fatty meat. Desserts and other sweets, sugary drinks, and other foods with added sugar. Full-fat   dairy products. Do not salt foods before eating. Do not eat more than 4 egg yolks a week. Try to eat at least 2 vegetarian meals a week. Eat more home-cooked food and less restaurant, buffet, and fast food. Lifestyle When eating at a restaurant, ask if your food can be made with less salt or no salt. If you drink alcohol: Limit how much you have to: 0-1 drink a day if you are female. 0-2 drinks a day if you are female. Know how much alcohol is in  your drink. In the [place] one drink is one 12 oz bottle of beer (355 mL), one 5 oz glass of wine (148 mL), or one 1 oz glass of hard liquor (44 mL). General information Avoid eating more than 2,300 mg of salt a day. If you have hypertension, you may need to reduce your sodium intake to 1,500 mg a day. Work with your provider to stay at a healthy body weight or lose weight. Ask what the best weight range is for you. On most days of the week, get at least 30 minutes of exercise that causes your heart to beat faster. This may include walking, swimming, or biking. Work with your provider or dietitian to adjust your eating plan to meet your specific calorie needs. What foods should I eat? Fruits All fresh, dried, or frozen fruit. Canned fruits that are in their natural juice and do not have sugar added to them. Vegetables Fresh or frozen vegetables that are raw, steamed, roasted, or grilled. Low-sodium or reduced-sodium tomato and vegetable juice. Low-sodium or reduced-sodium tomato sauce and tomato paste. Low-sodium or reduced-sodium canned vegetables. Grains Whole-grain or whole-wheat bread. Whole-grain or whole-wheat pasta. Brown rice. Oatmeal. Quinoa. Bulgur. Whole-grain and low-sodium cereals. Pita bread. Low-fat, low-sodium crackers. Whole-wheat flour tortillas. Meats and other proteins Skinless chicken or turkey. Ground chicken or turkey. Pork with fat trimmed off. Fish and seafood. Egg whites. Dried beans, peas, or lentils. Unsalted nuts, nut butters, and seeds. Unsalted canned beans. Lean cuts of beef with fat trimmed off. Low-sodium, lean precooked or cured meat, such as sausages or meat loaves. Dairy Low-fat (1%) or fat-free (skim) milk. Reduced-fat, low-fat, or fat-free cheeses. Nonfat, low-sodium ricotta or cottage cheese. Low-fat or nonfat yogurt. Low-fat, low-sodium cheese. Fats and oils Soft margarine without trans fats. Vegetable oil. Reduced-fat, low-fat, or light mayonnaise and salad  dressings (reduced-sodium). Canola, safflower, olive, avocado, soybean, and sunflower oils. Avocado. Seasonings and condiments Herbs. Spices. Seasoning mixes without salt. Other foods Unsalted popcorn and pretzels. Fat-free sweets. The items listed above may not be all the foods and drinks you can have. Talk to a dietitian to learn more. What foods should I avoid? Fruits Canned fruit in a light or heavy syrup. Fried fruit. Fruit in cream or butter sauce. Vegetables Creamed or fried vegetables. Vegetables in a cheese sauce. Regular canned vegetables that are not marked as low-sodium or reduced-sodium. Regular canned tomato sauce and paste that are not marked as low-sodium or reduced-sodium. Regular tomato and vegetable juices that are not marked as low-sodium or reduced-sodium. Pickles. Olives. Grains Baked goods made with fat, such as croissants, muffins, or some breads. Dry pasta or rice meal packs. Meats and other proteins Fatty cuts of meat. Ribs. Fried meat. Bacon. Bologna, salami, and other precooked or cured meats, such as sausages or meat loaves, that are not lean and low in sodium. Fat from the back of a pig (fatback). Bratwurst. Salted nuts and seeds. Canned beans with added salt. Canned   or smoked fish. Whole eggs or egg yolks. Chicken or turkey with skin. Dairy Whole or 2% milk, cream, and half-and-half. Whole or full-fat cream cheese. Whole-fat or sweetened yogurt. Full-fat cheese. Nondairy creamers. Whipped toppings. Processed cheese and cheese spreads. Fats and oils Butter. Stick margarine. Lard. Shortening. Ghee. Bacon fat. Tropical oils, such as coconut, palm kernel, or palm oil. Seasonings and condiments Onion salt, garlic salt, seasoned salt, table salt, and sea salt. Worcestershire sauce. Tartar sauce. Barbecue sauce. Teriyaki sauce. Soy sauce, including reduced-sodium soy sauce. Steak sauce. Canned and packaged gravies. Fish sauce. Oyster sauce. Cocktail sauce. Store-bought  horseradish. Ketchup. Mustard. Meat flavorings and tenderizers. Bouillon cubes. Hot sauces. Pre-made or packaged marinades. Pre-made or packaged taco seasonings. Relishes. Regular salad dressings. Other foods Salted popcorn and pretzels. The items listed above may not be all the foods and drinks you should avoid. Talk to a dietitian to learn more. Where to find more information National Heart, Lung, and Blood Institute (NHLBI): nhlbi.nih.gov American Heart Association (AHA): heart.org Academy of Nutrition and Dietetics: eatright.org National Kidney Foundation (NKF): kidney.org This information is not intended to replace advice given to you by your health care provider. Make sure you discuss any questions you have with your health care provider. Document Revised: 11/02/2022 Document Reviewed: 11/02/2022 Elsevier Patient Education  2024 Elsevier Inc.  

## 2023-08-21 ENCOUNTER — Ambulatory Visit (INDEPENDENT_AMBULATORY_CARE_PROVIDER_SITE_OTHER): Payer: Medicare Other | Admitting: Nurse Practitioner

## 2023-08-21 ENCOUNTER — Encounter: Payer: Self-pay | Admitting: Nurse Practitioner

## 2023-08-21 VITALS — BP 107/69 | HR 72 | Temp 98.1°F | Ht 64.5 in | Wt 203.4 lb

## 2023-08-21 DIAGNOSIS — E7800 Pure hypercholesterolemia, unspecified: Secondary | ICD-10-CM | POA: Diagnosis not present

## 2023-08-21 DIAGNOSIS — Z23 Encounter for immunization: Secondary | ICD-10-CM

## 2023-08-21 DIAGNOSIS — E063 Autoimmune thyroiditis: Secondary | ICD-10-CM | POA: Diagnosis not present

## 2023-08-21 DIAGNOSIS — I700 Atherosclerosis of aorta: Secondary | ICD-10-CM | POA: Diagnosis not present

## 2023-08-21 DIAGNOSIS — I10 Essential (primary) hypertension: Secondary | ICD-10-CM | POA: Diagnosis not present

## 2023-08-21 DIAGNOSIS — F411 Generalized anxiety disorder: Secondary | ICD-10-CM

## 2023-08-21 DIAGNOSIS — F32A Depression, unspecified: Secondary | ICD-10-CM

## 2023-08-21 MED ORDER — LOSARTAN POTASSIUM 50 MG PO TABS: 50.0000 mg | ORAL_TABLET | Freq: Every day | ORAL | 4 refills | Status: DC

## 2023-08-21 MED ORDER — SERTRALINE HCL 100 MG PO TABS: 50.0000 mg | ORAL_TABLET | Freq: Every day | ORAL | 4 refills | Status: DC

## 2023-08-21 MED ORDER — ATORVASTATIN CALCIUM 40 MG PO TABS: 40.0000 mg | ORAL_TABLET | Freq: Every day | ORAL | 4 refills | Status: DC

## 2023-08-21 MED ORDER — HYDROCHLOROTHIAZIDE 25 MG PO TABS
12.5000 mg | ORAL_TABLET | Freq: Every day | ORAL | 4 refills | Status: DC
Start: 2023-08-21 — End: 2024-08-21

## 2023-08-21 MED ORDER — LEVOTHYROXINE SODIUM 112 MCG PO TABS: 112.0000 ug | ORAL_TABLET | Freq: Every day | ORAL | 4 refills | Status: DC

## 2023-08-21 NOTE — Assessment & Plan Note (Signed)
Chronic, stable.  Denies SI/HI.  Continue current medication regimen and adjust as needed, continue to reduce as tolerated.  Check LFTs today.

## 2023-08-21 NOTE — Progress Notes (Signed)
BP 107/69 (BP Location: Left Arm, Cuff Size: Normal)   Pulse 72   Temp 98.1 F (36.7 C) (Oral)   Ht 5' 4.5" (1.638 m)   Wt 203 lb 6.4 oz (92.3 kg)   LMP  (LMP Unknown)   SpO2 96%   BMI 34.37 kg/m    Subjective:    Patient ID: Leslie Turner, female    DOB: 01/28/1956, 67 y.o.   MRN: [id number]  HPI: Leslie Turner is a 67 y.o. female  Chief Complaint  Patient presents with   Depression   Hyperlipidemia   Hypertension   Hypothyroidism   HYPERTENSION / HYPERLIPIDEMIA Continues on Losartan/HCTZ and Atorvastatin.  History of aortic atherosclerosis noted on past imaging. Satisfied with current treatment? yes Duration of hypertension: chronic BP monitoring frequency: not checking BP range:  BP medication side effects: no Past BP meds: HCTZ and losartan (cozaar) Duration of hyperlipidemia: chronic Cholesterol medication side effects: no Cholesterol supplements: none Past cholesterol medications: atorvastatin (lipitor) Medication compliance: good compliance Aspirin: no Recent stressors: no Recurrent headaches: no Visual changes: no Palpitations: no Dyspnea: no Chest pain: no Lower extremity edema: no Dizzy/lightheaded: occasional if gets up too fast   HYPOTHYROIDISM Continues Levothyroxine 112 MCG. Thyroid control status:controlled Satisfied with current treatment? yes Medication side effects: no Medication compliance: good compliance Etiology of hypothyroidism: Hashimoto's Recent dose adjustment:no Fatigue: yes due to recent knee surgery Cold intolerance: no Heat intolerance: no Weight gain: no Weight loss: no Constipation: no Diarrhea/loose stools: no Palpitations: no Lower extremity edema: no Anxiety/depressed mood: no    ANXIETY/DEPRESSION Taking 50 MG of Zoloft at this time. Mood status: stable Satisfied with current treatment?: yes Symptom severity: moderate  Duration of current treatment : years Side effects: no Medication compliance: good  compliance Psychotherapy/counseling: none Previous psychiatric medications: as above Depressed mood: no Anxious mood: no Anhedonia: no Significant weight loss or gain: no Insomnia: none Fatigue: yes Feelings of worthlessness or guilt: no Impaired concentration/indecisiveness: no Suicidal ideations: no Hopelessness: no Crying spells: no    08/21/2023    9:45 AM 08/03/2023    3:00 PM 02/19/2023   10:33 AM 10/03/2022    4:00 PM 08/31/2022   11:48 AM  Depression screen PHQ 2/9  Decreased Interest 0 0 0 0 0  Down, Depressed, Hopeless 0 0 0 0 0  PHQ - 2 Score 0 0 0 0 0  Altered sleeping 0 0 0 0 0  Tired, decreased energy 0 0 0 0 0  Change in appetite 0 0 0 0 0  Feeling bad or failure about yourself  0 0 0 0 0  Trouble concentrating 0 0 0 0 0  Moving slowly or fidgety/restless 0 0 0 0 0  Suicidal thoughts 0 0 0 0 0  PHQ-9 Score 0 0 0 0 0  Difficult doing work/chores Not difficult at all Not difficult at all Not difficult at all Not difficult at all Not difficult at all       08/21/2023    9:46 AM 02/19/2023   10:33 AM 10/03/2022    4:00 PM 08/10/2022    9:35 AM  GAD 7 : Generalized Anxiety Score  Nervous, Anxious, on Edge 0 0 0 0  Control/stop worrying 0 0 0 0  Worry too much - different things 0 0 0 0  Trouble relaxing 0 0 0 0  Restless 0 0 0 0  Easily annoyed or irritable 0 0 0 0  Afraid - awful might happen 0 0  0 0  Total GAD 7 Score 0 0 0 0  Anxiety Difficulty Not difficult at all Not difficult at all Not difficult at all Not difficult at all   Relevant past medical, surgical, family and social history reviewed and updated as indicated. Interim medical history since our last visit reviewed. Allergies and medications reviewed and updated.  Review of Systems  Constitutional:  Negative for activity change, appetite change, diaphoresis, fatigue and fever.  Respiratory:  Negative for cough, chest tightness and shortness of breath.   Cardiovascular:  Negative for chest  pain, palpitations and leg swelling.  Gastrointestinal: Negative.   Endocrine: Negative for cold intolerance and heat intolerance.  Neurological:  Negative for dizziness, syncope, weakness, light-headedness, numbness and headaches.  Psychiatric/Behavioral: Negative.     Per HPI unless specifically indicated above     Objective:    BP 107/69 (BP Location: Left Arm, Cuff Size: Normal)   Pulse 72   Temp 98.1 F (36.7 C) (Oral)   Ht 5' 4.5" (1.638 m)   Wt 203 lb 6.4 oz (92.3 kg)   LMP  (LMP Unknown)   SpO2 96%   BMI 34.37 kg/m   Wt Readings from Last 3 Encounters:  08/21/23 203 lb 6.4 oz (92.3 kg)  08/03/23 201 lb (91.2 kg)  07/25/23 195 lb (88.5 kg)    Physical Exam Vitals and nursing note reviewed.  Constitutional:      General: She is awake. She is not in acute distress.    Appearance: She is well-developed and well-groomed. She is not ill-appearing or toxic-appearing.  HENT:     Head: Normocephalic.     Right Ear: Hearing and external ear normal.     Left Ear: Hearing and external ear normal.  Eyes:     General: Lids are normal.        Right eye: No discharge.        Left eye: No discharge.     Conjunctiva/sclera: Conjunctivae normal.     Pupils: Pupils are equal, round, and reactive to light.  Neck:     Thyroid: No thyromegaly.     Vascular: No carotid bruit.  Cardiovascular:     Rate and Rhythm: Normal rate and regular rhythm.     Heart sounds: Normal heart sounds. No murmur heard.    No gallop.  Pulmonary:     Effort: Pulmonary effort is normal. No accessory muscle usage or respiratory distress.     Breath sounds: Normal breath sounds.  Abdominal:     General: Bowel sounds are normal. There is no distension.     Palpations: Abdomen is soft.     Tenderness: There is no abdominal tenderness.  Musculoskeletal:     Cervical back: Normal range of motion and neck supple.     Right lower leg: No edema.     Left lower leg: No edema.  Lymphadenopathy:      Cervical: No cervical adenopathy.  Skin:    General: Skin is warm and dry.  Neurological:     Mental Status: She is alert and oriented to person, place, and time.     Deep Tendon Reflexes: Reflexes are normal and symmetric.     Reflex Scores:      Brachioradialis reflexes are 2+ on the right side and 2+ on the left side.      Patellar reflexes are 2+ on the right side and 2+ on the left side. Psychiatric:        Attention and Perception: Attention  normal.        Mood and Affect: Mood normal.        Speech: Speech normal.        Behavior: Behavior normal. Behavior is cooperative.        Thought Content: Thought content normal.    Results for orders placed or performed in visit on 08/03/23  WET PREP FOR TRICH, YEAST, CLUE   Specimen: Urine   Urine  Result Value Ref Range   Trichomonas Exam Negative Negative   Yeast Exam Negative Negative   Clue Cell Exam Negative Negative  Microscopic Examination   Urine  Result Value Ref Range   WBC, UA 0-5 0 - 5 /hpf   RBC, Urine 0-2 0 - 2 /hpf   Epithelial Cells (non renal) 0-10 0 - 10 /hpf   Casts Present (A) None seen /lpf   Cast Type Hyaline casts N/A   Bacteria, UA Few None seen/Few  Urinalysis, Routine w reflex microscopic  Result Value Ref Range   Specific Gravity, UA >1.030 (H) 1.005 - 1.030   pH, UA 6.0 5.0 - 7.5   Color, UA Yellow Yellow   Appearance Ur Cloudy (A) Clear   Leukocytes,UA Negative Negative   Protein,UA 1+ (A) Negative/Trace   Glucose, UA Negative Negative   Ketones, UA 1+ (A) Negative   RBC, UA Negative Negative   Bilirubin, UA Negative Negative   Urobilinogen, Ur 1.0 0.2 - 1.0 mg/dL   Nitrite, UA Negative Negative   Microscopic Examination See below:       Assessment & Plan:   Problem List Items Addressed This Visit       Cardiovascular and Mediastinum   Aortic atherosclerosis (HCC) - Primary    Chronic, stable.  Noted on lumbar imaging 11/04/21 -- continue statin daily and recommend ASA daily for  prevention.      Relevant Medications   losartan (COZAAR) 50 MG tablet   hydrochlorothiazide (HYDRODIURIL) 25 MG tablet   atorvastatin (LIPITOR) 40 MG tablet   Other Relevant Orders   Comprehensive metabolic panel   Lipid Panel w/o Chol/HDL Ratio   Essential hypertension, benign    Chronic, stable.  BP well below goal.  Continue current medication regimen and adjust as needed. Recommend checking BP at home three days a week + focus on DASH diet, could consider reduction of medication in future -- she can trial going down to 25 MG on Losartan.  Discussed with her.  LABS: CBC, CMP.  Urine ALB 80 April 2024, continue Losartan for kidney protection.  Refills sent.  Return in 6 months.      Relevant Medications   losartan (COZAAR) 50 MG tablet   hydrochlorothiazide (HYDRODIURIL) 25 MG tablet   atorvastatin (LIPITOR) 40 MG tablet   Other Relevant Orders   Comprehensive metabolic panel   CBC with Differential/Platelet     Endocrine   Hashimoto's thyroiditis    Chronic, stable.  Continue current medication regimen and adjust as needed.  Labs up to date.      Relevant Medications   levothyroxine (SYNTHROID) 112 MCG tablet     Other   Chronic depression    Chronic, stable.  Denies SI/HI.  Continue current medication regimen and adjust as needed, continue to reduce as tolerated.  Check LFTs today.        Relevant Medications   sertraline (ZOLOFT) 100 MG tablet   Generalized anxiety disorder    Chronic, stable.  Continue Sertraline 50 MG daily and consider reduction in future.  Denies SI/HI.      Relevant Medications   sertraline (ZOLOFT) 100 MG tablet   Hyperlipidemia    Chronic, stable.  Continue current medication regimen and adjust as needed.  Obtain CMP and lipid panel today.      Relevant Medications   losartan (COZAAR) 50 MG tablet   hydrochlorothiazide (HYDRODIURIL) 25 MG tablet   atorvastatin (LIPITOR) 40 MG tablet   Other Relevant Orders   Comprehensive metabolic  panel   Lipid Panel w/o Chol/HDL Ratio     Follow up plan: Return in about 6 months (around 02/19/2024) for Annual physical after 02/19/24 and needs Medicare Wellness nurse visit after 08/31/23.

## 2023-08-21 NOTE — Assessment & Plan Note (Signed)
Chronic, stable.  Continue current medication regimen and adjust as needed.  Obtain CMP and lipid panel today. 

## 2023-08-21 NOTE — Assessment & Plan Note (Signed)
Chronic, stable.  Continue current medication regimen and adjust as needed.  Labs up to date.

## 2023-08-21 NOTE — Assessment & Plan Note (Signed)
Chronic, stable.  BP well below goal.  Continue current medication regimen and adjust as needed. Recommend checking BP at home three days a week + focus on DASH diet, could consider reduction of medication in future -- she can trial going down to 25 MG on Losartan.  Discussed with her.  LABS: CBC, CMP.  Urine ALB 80 April 2024, continue Losartan for kidney protection.  Refills sent.  Return in 6 months.

## 2023-08-21 NOTE — Assessment & Plan Note (Signed)
Chronic, stable.  Continue Sertraline 50 MG daily and consider reduction in future.  Denies SI/HI.

## 2023-08-21 NOTE — Assessment & Plan Note (Signed)
Chronic, stable.  Noted on lumbar imaging 11/04/21 -- continue statin daily and recommend ASA daily for prevention.

## 2023-08-22 LAB — CBC WITH DIFFERENTIAL/PLATELET
Basophils Absolute: 0 10*3/uL (ref 0.0–0.2)
Basos: 1 %
EOS (ABSOLUTE): 0.4 10*3/uL (ref 0.0–0.4)
Eos: 6 %
Hematocrit: 39.3 % (ref 34.0–46.6)
Hemoglobin: 12.3 g/dL (ref 11.1–15.9)
Immature Grans (Abs): 0 10*3/uL (ref 0.0–0.1)
Immature Granulocytes: 0 %
Lymphocytes Absolute: 1.8 10*3/uL (ref 0.7–3.1)
Lymphs: 30 %
MCH: 29.9 pg (ref 26.6–33.0)
MCHC: 31.3 g/dL — ABNORMAL LOW (ref 31.5–35.7)
MCV: 95 fL (ref 79–97)
Monocytes Absolute: 0.5 10*3/uL (ref 0.1–0.9)
Monocytes: 9 %
Neutrophils Absolute: 3.1 10*3/uL (ref 1.4–7.0)
Neutrophils: 54 %
Platelets: 265 10*3/uL (ref 150–450)
RBC: 4.12 x10E6/uL (ref 3.77–5.28)
RDW: 13.3 % (ref 11.7–15.4)
WBC: 5.8 10*3/uL (ref 3.4–10.8)

## 2023-08-22 LAB — LIPID PANEL W/O CHOL/HDL RATIO
Cholesterol, Total: 163 mg/dL (ref 100–199)
HDL: 56 mg/dL (ref >39)
LDL Chol Calc (NIH): 82 mg/dL (ref 0–99)
Triglycerides: 143 mg/dL (ref 0–149)
VLDL Cholesterol Cal: 25 mg/dL (ref 5–40)

## 2023-08-22 LAB — COMPREHENSIVE METABOLIC PANEL
ALT: 15 [IU]/L (ref 0–32)
AST: 25 [IU]/L (ref 0–40)
Albumin: 4 g/dL (ref 3.9–4.9)
Alkaline Phosphatase: 124 [IU]/L — ABNORMAL HIGH (ref 44–121)
BUN/Creatinine Ratio: 18 (ref 12–28)
BUN: 15 mg/dL (ref 8–27)
Bilirubin Total: 0.6 mg/dL (ref 0.0–1.2)
CO2: 26 mmol/L (ref 20–29)
Calcium: 9.6 mg/dL (ref 8.7–10.3)
Chloride: 100 mmol/L (ref 96–106)
Creatinine, Ser: 0.83 mg/dL (ref 0.57–1.00)
Globulin, Total: 2.7 g/dL (ref 1.5–4.5)
Glucose: 84 mg/dL (ref 70–99)
Potassium: 3.5 mmol/L (ref 3.5–5.2)
Sodium: 140 mmol/L (ref 134–144)
Total Protein: 6.7 g/dL (ref 6.0–8.5)
eGFR: 78 mL/min/{1.73_m2} (ref >59)

## 2023-08-22 NOTE — Progress Notes (Signed)
Contacted via MyChart   Good evening Leslie Turner, overall labs look good.  LDL on Lipid panel a little above goal, but we can discuss increase in Atorvastatin next visit.  Any questions on these? Keep being excellent!!  Thank you for allowing me to participate in your care.  I appreciate you. Kindest regards, Juanetta Negash

## 2023-09-11 ENCOUNTER — Ambulatory Visit: Payer: Medicare Other | Admitting: Emergency Medicine

## 2023-09-11 VITALS — Ht 64.5 in | Wt 193.0 lb

## 2023-09-11 DIAGNOSIS — Z0000 Encounter for general adult medical examination without abnormal findings: Secondary | ICD-10-CM

## 2023-09-11 NOTE — Progress Notes (Signed)
Subjective:   Leslie Turner is a 67 y.o. female who presents for Medicare Annual (Subsequent) preventive examination.  Visit Complete: Virtual I connected with  [name] on 09/11/23 by a audio enabled telemedicine application and verified that I am speaking with the correct person using two identifiers.  Patient Location: Home  Provider Location: Office/Clinic  I discussed the limitations of evaluation and management by telemedicine. The patient expressed understanding and agreed to proceed.  Vital Signs: Because this visit was a virtual/telehealth visit, some criteria may be missing or patient reported. Any vitals not documented were not able to be obtained and vitals that have been documented are patient reported.  Patient Medicare AWV questionnaire was completed by the patient on 09/07/23; I have confirmed that all information answered by patient is correct and no changes since this date.  Cardiac Risk Factors include: advanced age (>80men, >92 women);hypertension;dyslipidemia;obesity (BMI >30kg/m2)     Objective:    Today's Vitals   09/11/23 1113  Weight: 193 lb (87.5 kg)  Height: 5' 4.5" (1.638 m)   Body mass index is 32.62 kg/m.     09/11/2023   11:29 AM 07/25/2023   10:19 AM 07/18/2023    8:21 AM 03/02/2023    5:00 PM 03/02/2023    9:50 AM 02/20/2023    1:23 PM 08/31/2022   11:45 AM  Advanced Directives  Does Patient Have a Medical Advance Directive? No No No Yes Yes No Yes  Type of [employer] [place];Living will  Healthcare Power of Attorney  Does patient want to make changes to medical advance directive?    No - Patient declined No - Patient declined    Copy of Healthcare Power of Attorney in Chart?    Yes - validated most recent copy scanned in chart (See row information) Yes - validated most recent copy scanned in chart (See row information)  No - copy requested  Would patient like information on  creating a medical advance directive? Yes (MAU/Ambulatory/Procedural Areas - Information given) No - Patient declined  No - Patient declined No - Patient declined No - Patient declined     Current Medications (verified) Outpatient Encounter Medications as of 09/11/2023  Medication Sig   aspirin EC 81 MG tablet Take 1 tablet (81 mg total) by mouth in the morning and at bedtime. Swallow whole.   atorvastatin (LIPITOR) 40 MG tablet Take 1 tablet (40 mg total) by mouth daily.   cholecalciferol (VITAMIN D) 1000 UNITS tablet Take 1,000 Units by mouth daily.   hydrochlorothiazide (HYDRODIURIL) 25 MG tablet Take 0.5 tablets (12.5 mg total) by mouth daily.   levothyroxine (SYNTHROID) 112 MCG tablet Take 1 tablet (112 mcg total) by mouth daily before breakfast.   losartan (COZAAR) 50 MG tablet Take 1 tablet (50 mg total) by mouth daily.   Multiple Vitamins-Minerals (CENTRUM SILVER 50+WOMEN PO) Take 1 tablet by mouth daily.   omeprazole (PRILOSEC) 20 MG capsule Take 20 mg by mouth daily.   sertraline (ZOLOFT) 100 MG tablet Take 0.5 tablets (50 mg total) by mouth daily.   oxyCODONE (OXY IR/ROXICODONE) 5 MG immediate release tablet Take 1 tablet (5 mg total) by mouth every 4 (four) hours as needed for moderate pain (pain score 4-6). (Patient not taking: Reported on 09/11/2023)   traMADol (ULTRAM) 50 MG tablet Take 1-2 tablets (50-100 mg total) by mouth every 4 (four) hours as needed for moderate pain. (Patient not taking: Reported  on 09/11/2023)   No facility-administered encounter medications on file as of 09/11/2023.    Allergies (verified) Patient has no known allergies.   History: Past Medical History:  Diagnosis Date   Anemia    Anxiety    Aortic atherosclerosis (HCC)    Carotid stenosis    Depression    Dyspnea    GERD (gastroesophageal reflux disease)    Hyperlipidemia    Hypertension    Hypothyroidism    Metabolic syndrome    Microscopic hematuria    Pain in knee    Primary  osteoarthritis of left knee    Vitamin D deficiency    Past Surgical History:  Procedure Laterality Date   APPENDECTOMY     COLONOSCOPY W/ POLYPECTOMY     ENDOMETRIAL BIOPSY  06/15/2009   disordered prolif. phase   FOOT NEUROMA SURGERY Left    KNEE ARTHROPLASTY Left 03/02/2023   Procedure: COMPUTER ASSISTED TOTAL KNEE ARTHROPLASTY - RNFA;  Surgeon: Donato Heinz, MD;  Location: [facility] ORS;  Service: Orthopedics;  Laterality: Left;   KNEE ARTHROPLASTY Right 07/25/2023   Procedure: COMPUTER ASSISTED TOTAL KNEE ARTHROPLASTY - RNFA;  Surgeon: Donato Heinz, MD;  Location: [facility] ORS;  Service: Orthopedics;  Laterality: Right;   Family History  Problem Relation Age of Onset   Arthritis Mother    Cancer Mother 33       colon   Diabetes Father    Heart disease Father    Hypertension Father    Breast cancer Paternal Aunt    Heart disease Paternal Grandfather        MI   Heart disease Brother    Lung cancer Brother    Social History   Socioeconomic History   Marital status: Married    Spouse name: Loraine Leriche   Number of children: 2   Years of education: Not on file   Highest education level: 12th grade  Occupational History   Occupation: retired  Tobacco Use   Smoking status: Former    Current packs/day: 0.00    Average packs/day: 0.5 packs/day for 13.0 years (6.5 ttl pk-yrs)    Types: Cigarettes    Start date: 51    Quit date: 1986    Years since quitting: 38.8   Smokeless tobacco: Never   Tobacco comments:    smoked for 12-14 years and quit cold [place]  Vaping Use   Vaping status: Never Used  Substance and Sexual Activity   Alcohol use: Not Currently    Comment: rare   Drug use: No   Sexual activity: Not Currently    Partners: Male  Other Topics Concern   Not on file  Social History Narrative   Married, retired and  used to to work At [employer] center.   Social Determinants of Health   Financial Resource Strain: Low Risk  (09/07/2023)   Overall Financial  Resource Strain (CARDIA)    Difficulty of Paying Living Expenses: Not hard at all  Food Insecurity: No Food Insecurity (09/07/2023)   Hunger Vital Sign    Worried About Running Out of Food in the Last Year: Never true    Ran Out of Food in the Last Year: Never true  Transportation Needs: No Transportation Needs (09/07/2023)   PRAPARE - Administrator, Civil Service (Medical): No    Lack of Transportation (Non-Medical): No  Physical Activity: Insufficiently Active (09/11/2023)   Exercise Vital Sign    Days of Exercise per Week: 5 days  Minutes of Exercise per Session: 20 min  Stress: No Stress Concern Present (09/07/2023)   [employer] of Occupational Health - Occupational Stress Questionnaire    Feeling of Stress : Not at all  Social Connections: Moderately Integrated (09/07/2023)   Social Connection and Isolation Panel [NHANES]    Frequency of Communication with Friends and Family: Three times a week    Frequency of Social Gatherings with Friends and Family: Once a week    Attends Religious Services: More than 4 times per year    Active Member of [employer] or Organizations: No    Attends Engineer, structural: Patient declined    Marital Status: Married    Tobacco Counseling Counseling given: Not Answered Tobacco comments: smoked for 12-14 years and quit cold [place]   Clinical Intake:  Pre-visit preparation completed: Yes  Pain : No/denies pain     BMI - recorded: 32.62 Nutritional Status: BMI > 30  Obese Nutritional Risks: None Diabetes: No  How often do you need to have someone help you when you read instructions, pamphlets, or other written materials from your doctor or pharmacy?: 1 - Never  Interpreter Needed?: No  Information entered by :: Tora Kindred, CMA   Activities of Daily Living    09/07/2023   11:55 AM 07/25/2023    3:59 PM  In your present state of health, do you have any difficulty performing the following activities:   Hearing? 0   Vision? 0   Difficulty concentrating or making decisions? 0   Walking or climbing stairs? 0   Dressing or bathing? 0   Doing errands, shopping? 0 0  Preparing Food and eating ? N   Using the Toilet? N   In the past six months, have you accidently leaked urine? Y   Comment wears a panty liner   Do you have problems with loss of bowel control? N   Managing your Medications? N   Managing your Finances? N   Housekeeping or managing your Housekeeping? N     Patient Care Team: Marjie Skiff, NP as PCP - General (Nurse Practitioner)  Indicate any recent Medical Services you may have received from other than [facility] providers in the past year (date may be approximate).     Assessment:   This is a routine wellness examination for [facility].  Hearing/Vision screen Hearing Screening - Comments:: Denies hearing loss Vision Screening - Comments:: Gets eye exams   Goals Addressed               This Visit's Progress     Patient Stated (pt-stated)        Continue to stay active and exercise      Depression Screen    09/11/2023   11:27 AM 08/21/2023    9:45 AM 08/03/2023    3:00 PM 02/19/2023   10:33 AM 02/19/2023    9:55 AM 10/03/2022    4:00 PM 08/31/2022   11:48 AM  PHQ 2/9 Scores  PHQ - 2 Score 0 0 0 0  0 0  PHQ- 9 Score 0 0 0 0  0 0  Exception Documentation     Patient refusal Patient refusal     Fall Risk    09/07/2023   11:55 AM 08/21/2023    9:45 AM 02/19/2023   10:34 AM 02/18/2023    1:56 PM 10/03/2022    3:59 PM  Fall Risk   Falls in the past year? 0 0 0 0 0  Number falls  in past yr: 0 0 0  0  Injury with Fall? 0 0 0  0  Risk for fall due to : No Fall Risks No Fall Risks No Fall Risks  No Fall Risks  Follow up Falls prevention discussed Falls evaluation completed Education provided  Falls evaluation completed    MEDICARE RISK AT HOME: Medicare Risk at Home Any stairs in or around the home?: Yes If so, are there any without handrails?: No Home  free of loose throw rugs in walkways, pet beds, electrical cords, etc?: Yes Adequate lighting in your home to reduce risk of falls?: Yes Life alert?: No Use of a cane, walker or w/c?: No Grab bars in the bathroom?: Yes Shower chair or bench in shower?: Yes Elevated toilet seat or a handicapped toilet?: Yes  TIMED UP AND GO:  Was the test performed?  No    Cognitive Function:        09/11/2023   11:30 AM 08/31/2022   11:43 AM  6CIT Screen  What Year? 0 points 0 points  What month? 0 points 0 points  What time? 0 points 0 points  Count back from 20 0 points 0 points  Months in reverse 0 points 0 points  Repeat phrase 0 points 0 points  Total Score 0 points 0 points    Immunizations Immunization History  Administered Date(s) Administered   Influenza, High Dose Seasonal PF 07/27/2022, 06/22/2023   Influenza,inj,Quad PF,6+ Mos 07/23/2019, 08/10/2020, 08/10/2021   Influenza-Unspecified 08/08/2016, 08/07/2017, 09/03/2018   PFIZER Comirnaty(Gray Top)Covid-19 Tri-Sucrose Vaccine 06/01/2021, 07/27/2022   PFIZER(Purple Top)SARS-COV-2 Vaccination 01/17/2020, 02/11/2020, 08/31/2020   PNEUMOCOCCAL CONJUGATE-20 02/19/2023   Pfizer Covid-19 Vaccine Bivalent Booster 38yrs & up 11/03/2021   Pfizer(Comirnaty)Fall Seasonal Vaccine 12 years and older 09/09/2023   Pneumococcal Conjugate-13 02/08/2022   Respiratory Syncytial Virus Vaccine,Recomb Aduvanted(Arexvy) 09/26/2022   Td 11/17/2000   Tdap 01/24/2011, 08/10/2021, 06/21/2022   Zoster Recombinant(Shingrix) 11/01/2018, 05/26/2019   Zoster, Live 07/23/2012    TDAP status: Up to date  Flu Vaccine status: Up to date  Pneumococcal vaccine status: Up to date  Covid-19 vaccine status: Completed vaccines  Qualifies for Shingles Vaccine? Yes   Zostavax completed No   Shingrix Completed?: Yes  Screening Tests Health Maintenance  Topic Date Due   COVID-19 Vaccine (8 - 2023-24 season) 11/04/2023   Medicare Annual Wellness (AWV)   09/10/2024   MAMMOGRAM  04/22/2025   Fecal DNA (Cologuard)  09/08/2025   DEXA SCAN  01/24/2027   DTaP/Tdap/Td (5 - Td or Tdap) 06/21/2032   Pneumonia Vaccine 48+ Years old  Completed   INFLUENZA VACCINE  Completed   Hepatitis C Screening  Completed   Zoster Vaccines- Shingrix  Completed   HPV VACCINES  Aged Out   Colonoscopy  Discontinued    Health Maintenance  There are no preventive care reminders to display for this patient.   Colorectal cancer screening: Type of screening: Cologuard. Completed 08/08/22. Repeat every 3 years  Mammogram status: Completed 04/23/23. Repeat every year  Bone Density status: Completed 01/23/22. Results reflect: Bone density results: OSTEOPENIA. Repeat every 5 years.  Lung Cancer Screening: (Low Dose CT Chest recommended if Age 86-80 years, 20 pack-year currently smoking OR have quit w/in 15years.) does not qualify.   Lung Cancer Screening Referral: n/a  Additional Screening:  Hepatitis C Screening: does not qualify; Completed 08/09/16  Vision Screening: Recommended annual ophthalmology exams for early detection of glaucoma and other disorders of the eye.  Dental Screening: Recommended annual dental exams  for proper oral hygiene   Community Resource Referral / Chronic Care Management: CRR required this visit?  No   CCM required this visit?  No     Plan:     I have personally reviewed and noted the following in the patient's chart:   Medical and social history Use of alcohol, tobacco or illicit drugs  Current medications and supplements including opioid prescriptions. Patient is not currently taking opioid prescriptions. Functional ability and status Nutritional status Physical activity Advanced directives List of other physicians Hospitalizations, surgeries, and ER visits in previous 12 months Vitals Screenings to include cognitive, depression, and falls Referrals and appointments  In addition, I have reviewed and discussed  with patient certain preventive protocols, quality metrics, and best practice recommendations. A written personalized care plan for preventive services as well as general preventive health recommendations were provided to patient.     Tora Kindred, CMA   09/11/2023   After Visit Summary: (MyChart) Due to this being a telephonic visit, the after visit summary with patients personalized plan was offered to patient via MyChart   Nurse Notes:  Patient UTD on everything. Doing great!

## 2023-09-11 NOTE — Patient Instructions (Addendum)
Leslie Turner , Thank you for taking time to come for your Medicare Wellness Visit. I appreciate your ongoing commitment to your health goals. Please review the following plan we discussed and let me know if I can assist you in the future.   Referrals/Orders/Follow-Ups/Clinician Recommendations: Keep up the great work!! Enjoy your birthday!  This is a list of the screening recommended for you and due dates:  Health Maintenance  Topic Date Due   COVID-19 Vaccine (8 - 2023-24 season) 11/04/2023   Mammogram  04/22/2024   Medicare Annual Wellness Visit  09/10/2024   Cologuard (Stool DNA test)  09/08/2025   DEXA scan (bone density measurement)  01/24/2027   DTaP/Tdap/Td vaccine (5 - Td or Tdap) 06/21/2032   Pneumonia Vaccine  Completed   Flu Shot  Completed   Hepatitis C Screening  Completed   Zoster (Shingles) Vaccine  Completed   HPV Vaccine  Aged Out   Colon Cancer Screening  Discontinued    Advanced directives: (ACP Link)Information on Advanced Care Planning can be found at [facility] [place] Advance Health Care Directives Advance Health Care Directives ([email or web address]   Once you have completed the forms, please bring a copy of your health care power of attorney and living will to the office to be added to your chart at your convenience.   Next Medicare Annual Wellness Visit scheduled for next year: Yes, 09/16/24 @ 10:40am

## 2024-02-03 ENCOUNTER — Encounter: Payer: Self-pay | Admitting: Nurse Practitioner

## 2024-02-04 IMAGING — MG MM DIGITAL SCREENING BILAT W/ TOMO AND CAD
8 series · 8 of 24 positions shown · non-contrast
Comparison: Previous exam(s).

CLINICAL DATA: Screening.

EXAM:
DIGITAL SCREENING BILATERAL MAMMOGRAM WITH TOMOSYNTHESIS AND CAD
TECHNIQUE: Bilateral screening digital craniocaudal and mediolateral oblique
mammograms were obtained. Bilateral screening digital breast
tomosynthesis was performed. The images were evaluated with
computer-aided detection.

[R CC synth-2D]
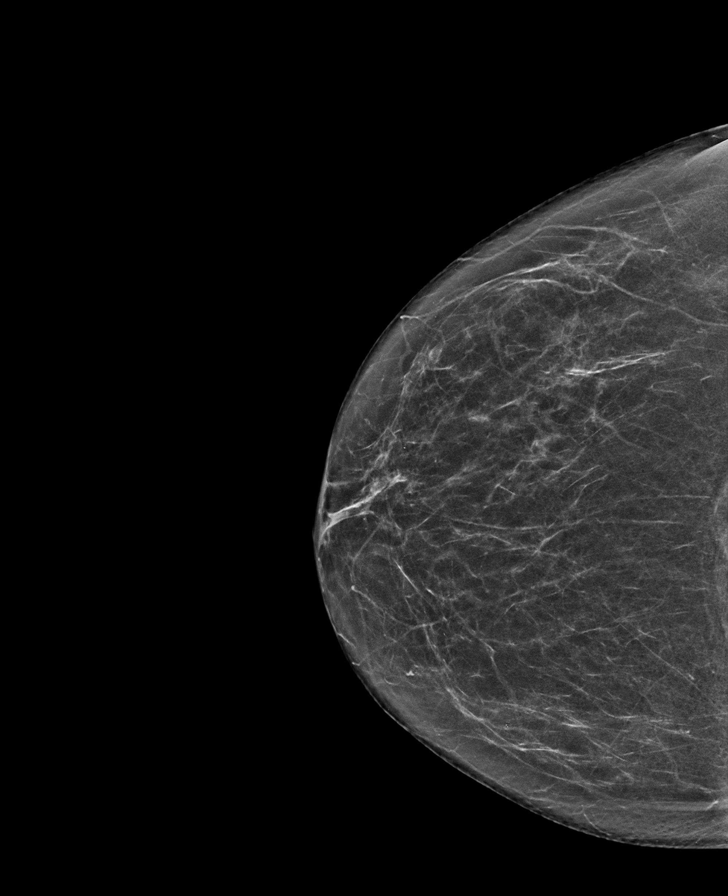

[R MLO synth-2D]
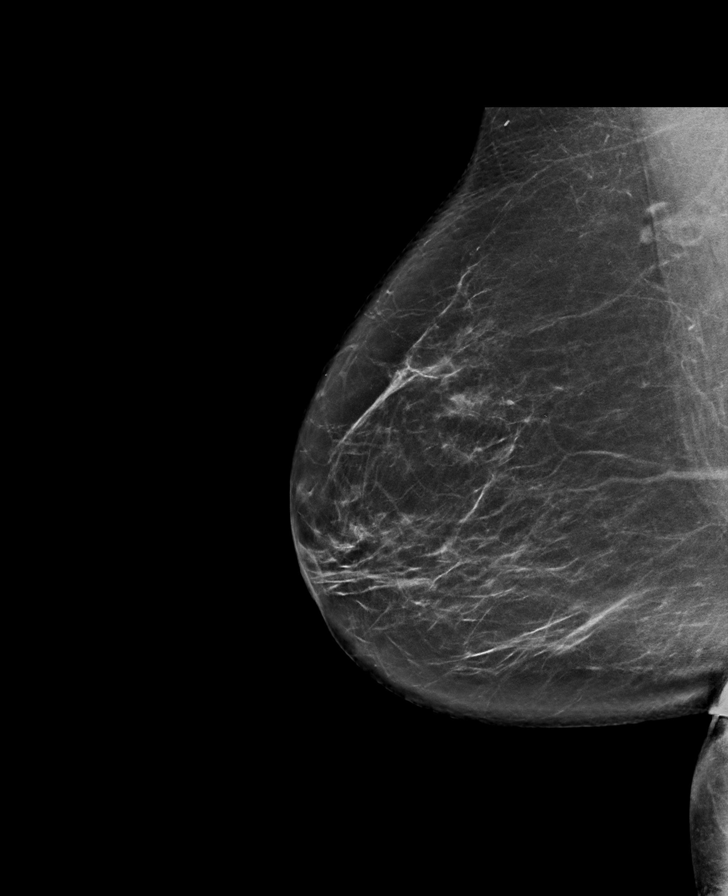

[L CC synth-2D]
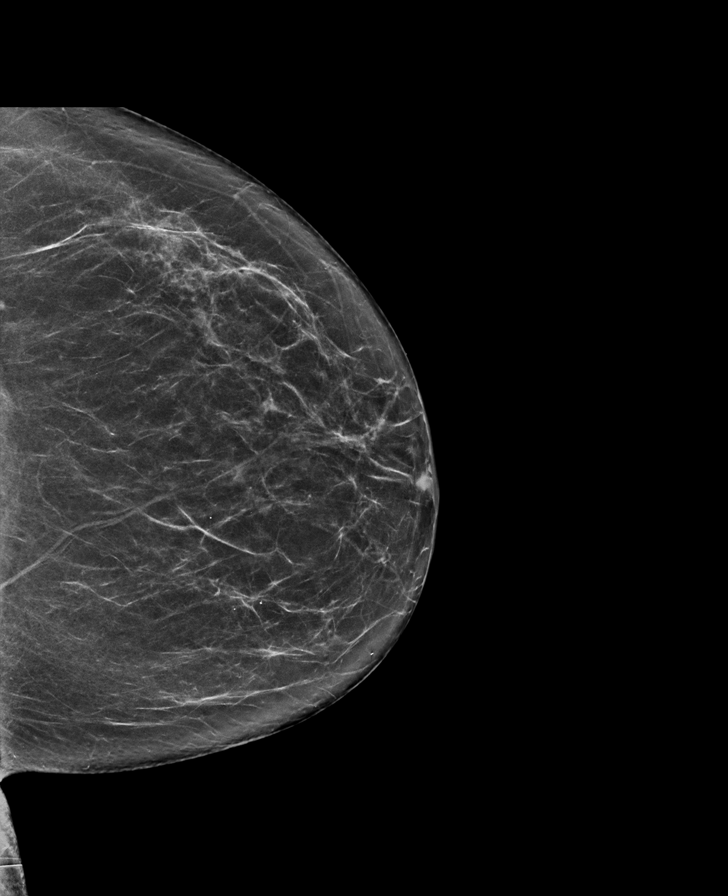

[L MLO synth-2D]
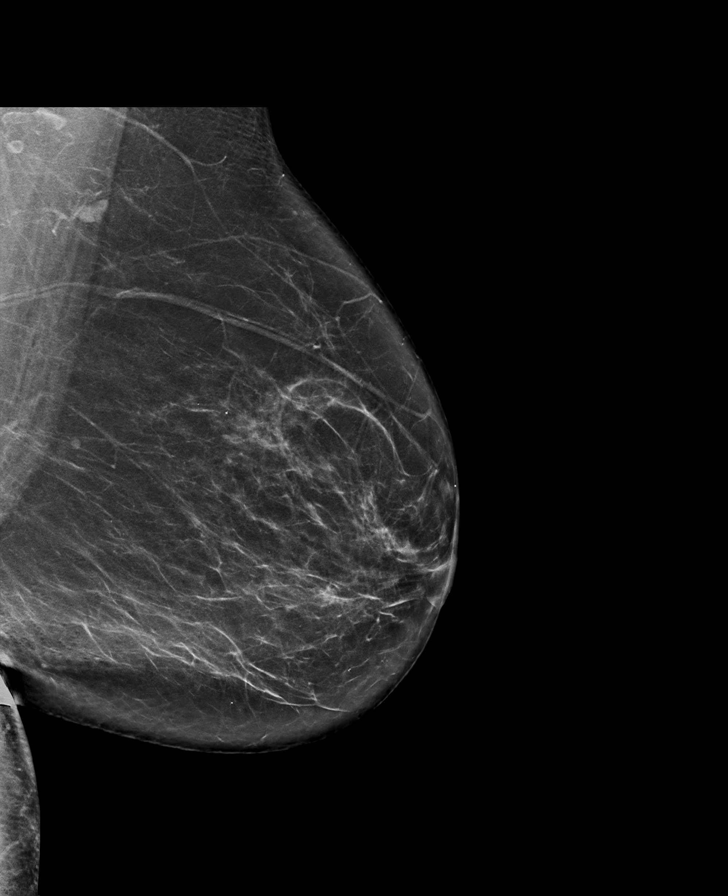

[R MLO tomo · tomo slice 41/80.0]
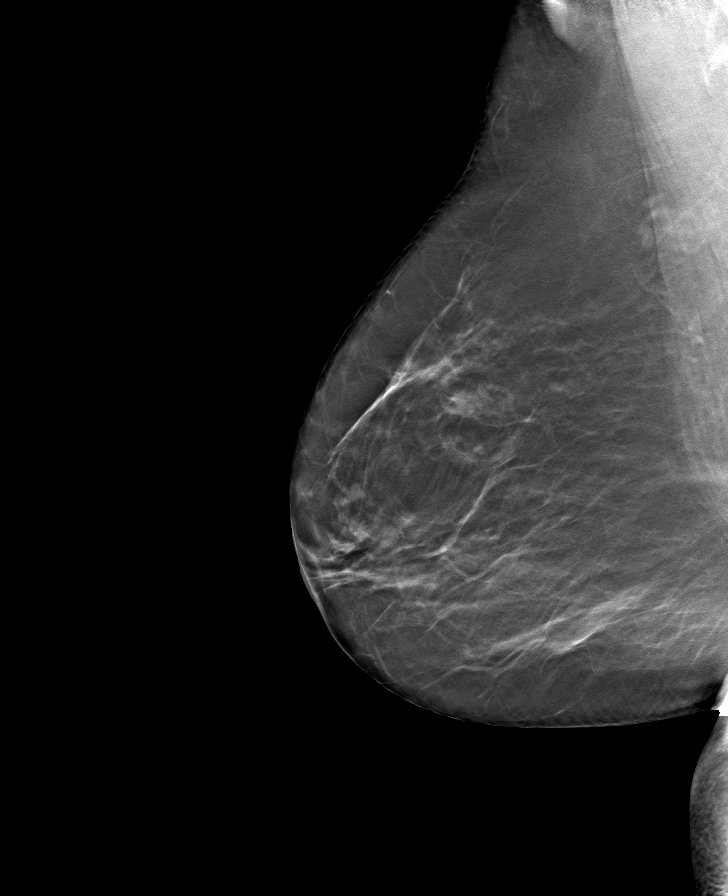

[L MLO tomo · tomo slice 40/79.0]
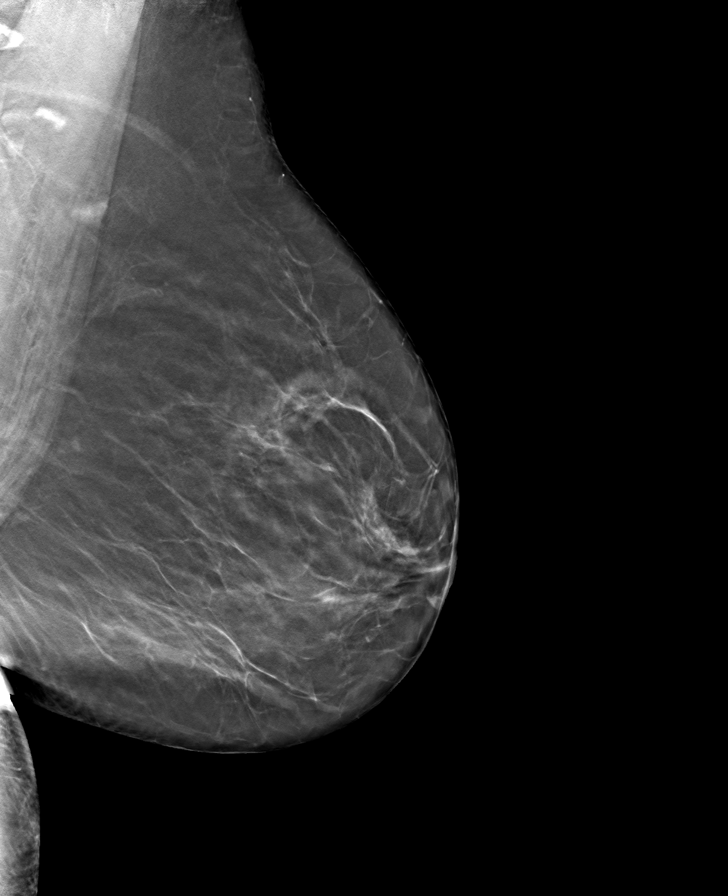

[L CC tomo · tomo slice 35/70.0]
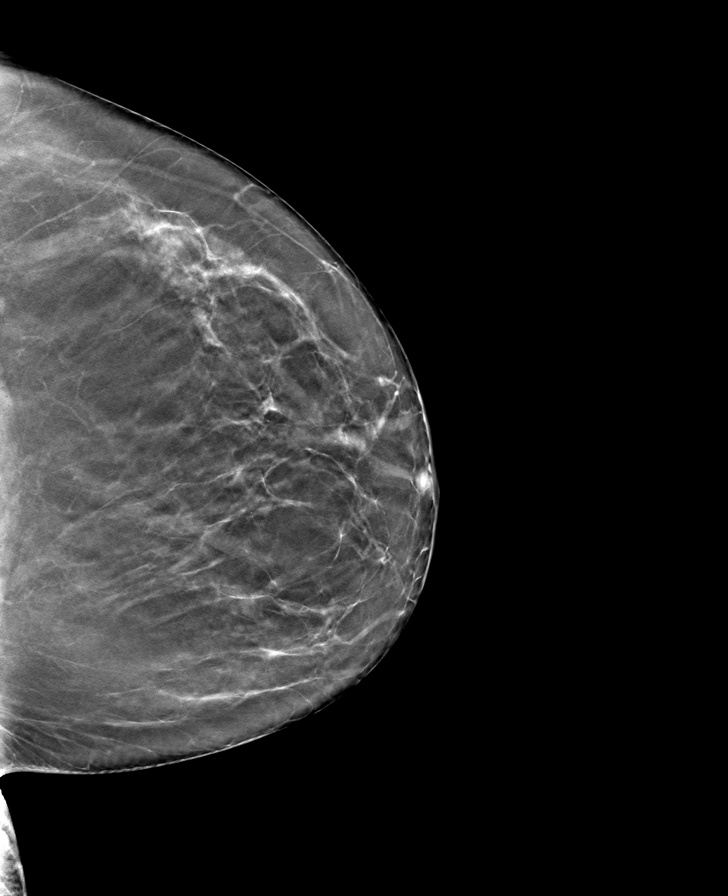

[R CC tomo · tomo slice 33/66.0]
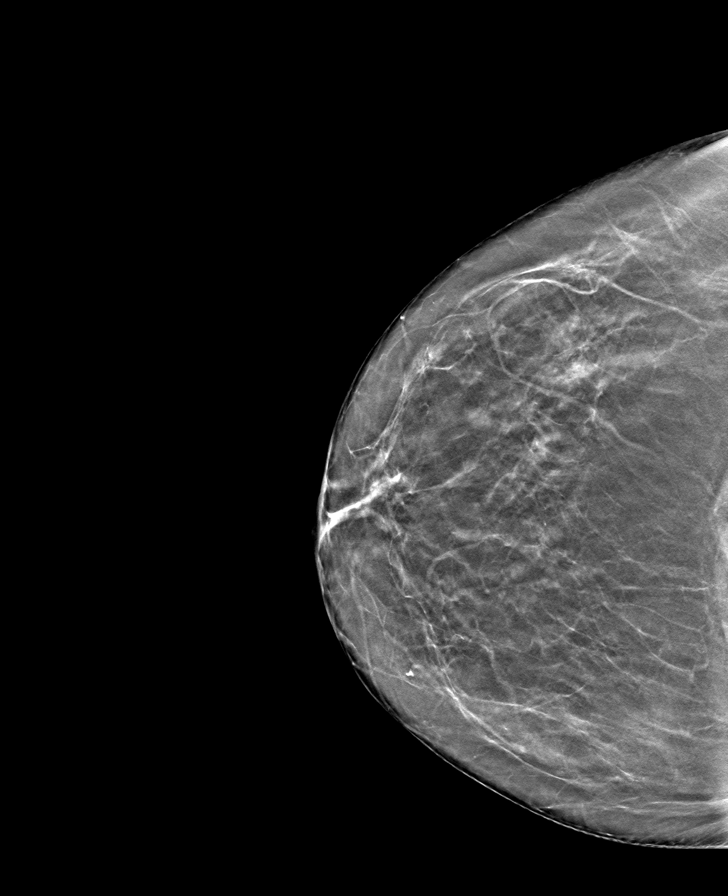

[8 of 24 positions shown; findings below may reference images not displayed]

ACR Breast Density Category b: There are scattered areas of
fibroglandular density.
FINDINGS: There are no findings suspicious for malignancy.
IMPRESSION: No mammographic evidence of malignancy. A result letter of this
screening mammogram will be mailed directly to the patient.

RECOMMENDATION:
Screening mammogram in one year. (Code:51-O-LD2)

BI-RADS CATEGORY  1: Negative.

## 2024-02-04 MED ORDER — SERTRALINE HCL 100 MG PO TABS: 50.0000 mg | ORAL_TABLET | Freq: Every day | ORAL | 4 refills | Status: AC

## 2024-02-17 NOTE — Patient Instructions (Signed)
 Be Involved in Caring For Your Health:  Taking Medications When medications are taken as directed, they can greatly improve your health. But if they are not taken as prescribed, they may not work. In some cases, not taking them correctly can be harmful. To help ensure your treatment remains effective and safe, understand your medications and how to take them. Bring your medications to each visit for review by your provider.  Your lab results, notes, and after visit summary will be available on My Chart. We strongly encourage you to use this feature. If lab results are abnormal the clinic will contact you with the appropriate steps. If the clinic does not contact you assume the results are satisfactory. You can always view your results on My Chart. If you have questions regarding your health or results, please contact the clinic during office hours. You can also ask questions on My Chart.  We at [facility] are grateful that you chose [place] to provide your care. We strive to provide evidence-based and compassionate care and are always looking for feedback. If you get a survey from the clinic please complete this so we can hear your opinions.  DASH Eating Plan DASH stands for Dietary Approaches to Stop Hypertension. The DASH eating plan is a healthy eating plan that has been shown to: Lower high blood pressure (hypertension). Reduce your risk for type 2 diabetes, heart disease, and stroke. Help with weight loss. What are tips for following this plan? Reading food labels Check food labels for the amount of salt (sodium) per serving. Choose foods with less than 5 percent of the Daily Value (DV) of sodium. In general, foods with less than 300 milligrams (mg) of sodium per serving fit into this eating plan. To find whole grains, look for the word "whole" as the first word in the ingredient list. Shopping Buy products labeled as "low-sodium" or "no salt added." Buy fresh foods. Avoid canned  foods and pre-made or frozen meals. Cooking Try not to add salt when you cook. Use salt-free seasonings or herbs instead of table salt or sea salt. Check with your health care provider or pharmacist before using salt substitutes. Do not fry foods. Cook foods in healthy ways, such as baking, boiling, grilling, roasting, or broiling. Cook using oils that are good for your heart. These include olive, canola, avocado, soybean, and sunflower oil. Meal planning  Eat a balanced diet. This should include: 4 or more servings of fruits and 4 or more servings of vegetables each day. Try to fill half of your plate with fruits and vegetables. 6-8 servings of whole grains each day. 6 or less servings of lean meat, poultry, or fish each day. 1 oz is 1 serving. A 3 oz (85 g) serving of meat is about the same size as the palm of your hand. One egg is 1 oz (28 g). 2-3 servings of low-fat dairy each day. One serving is 1 cup (237 mL). 1 serving of nuts, seeds, or beans 5 times each week. 2-3 servings of heart-healthy fats. Healthy fats called omega-3 fatty acids are found in foods such as walnuts, flaxseeds, fortified milks, and eggs. These fats are also found in cold-water fish, such as sardines, salmon, and mackerel. Limit how much you eat of: Canned or prepackaged foods. Food that is high in trans fat, such as fried foods. Food that is high in saturated fat, such as fatty meat. Desserts and other sweets, sugary drinks, and other foods with added sugar. Full-fat  dairy products. Do not salt foods before eating. Do not eat more than 4 egg yolks a week. Try to eat at least 2 vegetarian meals a week. Eat more home-cooked food and less restaurant, buffet, and fast food. Lifestyle When eating at a restaurant, ask if your food can be made with less salt or no salt. If you drink alcohol: Limit how much you have to: 0-1 drink a day if you are female. 0-2 drinks a day if you are female. Know how much alcohol is in  your drink. In the [place] one drink is one 12 oz bottle of beer (355 mL), one 5 oz glass of wine (148 mL), or one 1 oz glass of hard liquor (44 mL). General information Avoid eating more than 2,300 mg of salt a day. If you have hypertension, you may need to reduce your sodium intake to 1,500 mg a day. Work with your provider to stay at a healthy body weight or lose weight. Ask what the best weight range is for you. On most days of the week, get at least 30 minutes of exercise that causes your heart to beat faster. This may include walking, swimming, or biking. Work with your provider or dietitian to adjust your eating plan to meet your specific calorie needs. What foods should I eat? Fruits All fresh, dried, or frozen fruit. Canned fruits that are in their natural juice and do not have sugar added to them. Vegetables Fresh or frozen vegetables that are raw, steamed, roasted, or grilled. Low-sodium or reduced-sodium tomato and vegetable juice. Low-sodium or reduced-sodium tomato sauce and tomato paste. Low-sodium or reduced-sodium canned vegetables. Grains Whole-grain or whole-wheat bread. Whole-grain or whole-wheat pasta. Brown rice. Orpah Cobb. Bulgur. Whole-grain and low-sodium cereals. Pita bread. Low-fat, low-sodium crackers. Whole-wheat flour tortillas. Meats and other proteins Skinless chicken or [place]. Ground chicken or [place]. Pork with fat trimmed off. Fish and seafood. Egg whites. Dried beans, peas, or lentils. Unsalted nuts, nut butters, and seeds. Unsalted canned beans. Lean cuts of beef with fat trimmed off. Low-sodium, lean precooked or cured meat, such as sausages or meat loaves. Dairy Low-fat (1%) or fat-free (skim) milk. Reduced-fat, low-fat, or fat-free cheeses. Nonfat, low-sodium ricotta or cottage cheese. Low-fat or nonfat yogurt. Low-fat, low-sodium cheese. Fats and oils Soft margarine without trans fats. Vegetable oil. Reduced-fat, low-fat, or light mayonnaise and salad  dressings (reduced-sodium). Canola, safflower, olive, avocado, soybean, and sunflower oils. Avocado. Seasonings and condiments Herbs. Spices. Seasoning mixes without salt. Other foods Unsalted popcorn and pretzels. Fat-free sweets. The items listed above may not be all the foods and drinks you can have. Talk to a dietitian to learn more. What foods should I avoid? Fruits Canned fruit in a light or heavy syrup. Fried fruit. Fruit in cream or butter sauce. Vegetables Creamed or fried vegetables. Vegetables in a cheese sauce. Regular canned vegetables that are not marked as low-sodium or reduced-sodium. Regular canned tomato sauce and paste that are not marked as low-sodium or reduced-sodium. Regular tomato and vegetable juices that are not marked as low-sodium or reduced-sodium. Rosita Fire. Olives. Grains Baked goods made with fat, such as croissants, muffins, or some breads. Dry pasta or rice meal packs. Meats and other proteins Fatty cuts of meat. Ribs. Fried meat. Tomasa Blase. Bologna, salami, and other precooked or cured meats, such as sausages or meat loaves, that are not lean and low in sodium. Fat from the back of a pig (fatback). Bratwurst. Salted nuts and seeds. Canned beans with added salt. Canned  or smoked fish. Whole eggs or egg yolks. Chicken or [place] with skin. Dairy Whole or 2% milk, cream, and half-and-half. Whole or full-fat cream cheese. Whole-fat or sweetened yogurt. Full-fat cheese. Nondairy creamers. Whipped toppings. Processed cheese and cheese spreads. Fats and oils Butter. Stick margarine. Lard. Shortening. Ghee. Bacon fat. Tropical oils, such as coconut, palm kernel, or palm oil. Seasonings and condiments Onion salt, garlic salt, seasoned salt, table salt, and sea salt. Worcestershire sauce. Tartar sauce. Barbecue sauce. Teriyaki sauce. Soy sauce, including reduced-sodium soy sauce. Steak sauce. Canned and packaged gravies. Fish sauce. Oyster sauce. Cocktail sauce. Store-bought  horseradish. Ketchup. Mustard. Meat flavorings and tenderizers. Bouillon cubes. Hot sauces. Pre-made or packaged marinades. Pre-made or packaged taco seasonings. Relishes. Regular salad dressings. Other foods Salted popcorn and pretzels. The items listed above may not be all the foods and drinks you should avoid. Talk to a dietitian to learn more. Where to find more information National Heart, Lung, and Blood Institute (NHLBI): BuffaloDryCleaner.gl American Heart Association (AHA): heart.org Academy of Nutrition and Dietetics: eatright.org National Kidney Foundation (NKF): kidney.org This information is not intended to replace advice given to you by your health care provider. Make sure you discuss any questions you have with your health care provider. Document Revised: 11/02/2022 Document Reviewed: 11/02/2022 Elsevier Patient Education  2024 [employer].

## 2024-02-20 ENCOUNTER — Encounter: Payer: Self-pay | Admitting: Nurse Practitioner

## 2024-02-20 ENCOUNTER — Ambulatory Visit (INDEPENDENT_AMBULATORY_CARE_PROVIDER_SITE_OTHER): Payer: Self-pay | Admitting: Nurse Practitioner

## 2024-02-20 VITALS — BP 128/78 | HR 59 | Temp 98.7°F | Ht 63.7 in | Wt 203.2 lb

## 2024-02-20 DIAGNOSIS — Z1231 Encounter for screening mammogram for malignant neoplasm of breast: Secondary | ICD-10-CM

## 2024-02-20 DIAGNOSIS — R801 Persistent proteinuria, unspecified: Secondary | ICD-10-CM

## 2024-02-20 DIAGNOSIS — F32A Depression, unspecified: Secondary | ICD-10-CM

## 2024-02-20 DIAGNOSIS — M79642 Pain in left hand: Secondary | ICD-10-CM

## 2024-02-20 DIAGNOSIS — I700 Atherosclerosis of aorta: Secondary | ICD-10-CM

## 2024-02-20 DIAGNOSIS — Z0000 Encounter for general adult medical examination without abnormal findings: Secondary | ICD-10-CM

## 2024-02-20 DIAGNOSIS — E063 Autoimmune thyroiditis: Secondary | ICD-10-CM

## 2024-02-20 DIAGNOSIS — F411 Generalized anxiety disorder: Secondary | ICD-10-CM

## 2024-02-20 DIAGNOSIS — I10 Essential (primary) hypertension: Secondary | ICD-10-CM | POA: Diagnosis not present

## 2024-02-20 DIAGNOSIS — M85851 Other specified disorders of bone density and structure, right thigh: Secondary | ICD-10-CM

## 2024-02-20 DIAGNOSIS — E7800 Pure hypercholesterolemia, unspecified: Secondary | ICD-10-CM

## 2024-02-20 DIAGNOSIS — M79641 Pain in right hand: Secondary | ICD-10-CM | POA: Insufficient documentation

## 2024-02-20 LAB — MICROALBUMIN, URINE WAIVED
Creatinine, Urine Waived: 100 mg/dL (ref 10–300)
Microalb, Ur Waived: 10 mg/L (ref 0–19)
Microalb/Creat Ratio: <30 mg/g (ref <30)

## 2024-02-20 NOTE — Progress Notes (Signed)
 BP 128/78 (BP Location: Left Arm, Patient Position: Sitting, Cuff Size: Normal)   Pulse (!) 59   Temp 98.7 F (37.1 C) (Oral)   Ht 5' 3.7" (1.618 m)   Wt 203 lb 3.2 oz (92.2 kg)   LMP  (LMP Unknown)   SpO2 98%   BMI 35.21 kg/m    Subjective:    Patient ID: Leslie Turner, female    DOB: 05/14/1956, 68 y.o.   MRN: [id number]  HPI: Leslie Turner is a 68 y.o. female presenting on 02/20/2024 for comprehensive medical examination. Current medical complaints include: none  She currently lives with: husband Menopausal Symptoms: no  HAND PAIN Has been having right hand pain since recent move to [place], left has a little pain. Started after packing and moving. Wakes her up at night. Duration: weeks -- 3 weeks Involved hand: bilateral R>L Mechanism of injury: unknown Location: dorsal Onset: sudden Severity: 8/10 at times Quality: sharp, dull, aching, and throbbing Frequency: constant Radiation: no Aggravating factors: worse at night Alleviating factors: Tylenol  Treatments attempted: Tylenol  Relief with NSAIDs?: No NSAIDs Taken Weakness: no Numbness: yes at times Redness: no Swelling: a little bit Bruising: no Fevers: no   HYPERTENSION / HYPERLIPIDEMIA Takes Losartan , HCTZ, and Atorvastatin . Satisfied with current treatment? yes Duration of hypertension: chronic BP monitoring frequency: not checking BP range:  BP medication side effects: no Past BP meds: HCTZ and losartan  (cozaar ) Duration of hyperlipidemia: chronic Cholesterol medication side effects: no Cholesterol supplements: none Past cholesterol medications: atorvastain (lipitor) Medication compliance: good compliance Aspirin : yes Recent stressors: no Recurrent headaches: no Visual changes: no Palpitations: no Dyspnea: no Chest pain: no Lower extremity edema: no Dizzy/lightheaded: no  The 10-year ASCVD risk score (Arnett DK, et al., 2019) is: 8.4%   Values used to calculate the score:     Age: 25  years     Sex: Female     Is Non-Hispanic African American: No     Diabetic: No     Tobacco smoker: No     Systolic Blood Pressure: 128 mmHg     Is BP treated: Yes     HDL Cholesterol: 56 mg/dL     Total Cholesterol: 163 mg/dL  OSTEOPENIA DEXA 8/65/78, T-score of -1.5. Taking supplements. Satisfied with current treatment?: yes Adequate calcium  & vitamin D : yes Weight bearing exercises: yes   HYPOTHYROIDISM Taking Levothyroxine  112 MCG.  Thyroid  control status:controlled Satisfied with current treatment? yes Medication side effects: no Medication compliance: good compliance Etiology of hypothyroidism:  Recent dose adjustment:no Fatigue: no Cold intolerance: no Heat intolerance: no Weight gain: no Weight loss: no Constipation: no Diarrhea/loose stools: no Palpitations: no Lower extremity edema: no Anxiety/depressed mood: no    ANXIETY/DEPRESSION Taking Zoloft  50 MG daily. Anxious mood: no  Excessive worrying: no Irritability: no  Sweating: no Nausea: no Palpitations:no Hyperventilation: no Panic attacks: no Agoraphobia: no  Obscessions/compulsions: no Depressed mood: no    02/20/2024   11:08 AM 09/11/2023   11:27 AM 08/21/2023    9:45 AM 08/03/2023    3:00 PM 02/19/2023   10:33 AM  Depression screen PHQ 2/9  Decreased Interest 0 0 0 0 0  Down, Depressed, Hopeless 0 0 0 0 0  PHQ - 2 Score 0 0 0 0 0  Altered sleeping 0 0 0 0 0  Tired, decreased energy 0 0 0 0 0  Change in appetite 0 0 0 0 0  Feeling bad or failure about yourself  0 0 0 0 0  Trouble concentrating 0 0 0 0 0  Moving slowly or fidgety/restless 0 0 0 0 0  Suicidal thoughts 0 0 0 0 0  PHQ-9 Score 0 0 0 0 0  Difficult doing work/chores Not difficult at all Not difficult at all Not difficult at all Not difficult at all Not difficult at all        02/20/2024   11:08 AM 08/21/2023    9:46 AM 02/19/2023   10:33 AM 10/03/2022    4:00 PM  GAD 7 : Generalized Anxiety Score  Nervous, Anxious, on  Edge 0 0 0 0  Control/stop worrying 0 0 0 0  Worry too much - different things 0 0 0 0  Trouble relaxing 0 0 0 0  Restless 0 0 0 0  Easily annoyed or irritable 0 0 0 0  Afraid - awful might happen 0 0 0 0  Total GAD 7 Score 0 0 0 0  Anxiety Difficulty Not difficult at all Not difficult at all Not difficult at all Not difficult at all      02/18/2023    1:56 PM 02/19/2023   10:34 AM 08/21/2023    9:45 AM 09/07/2023   11:55 AM 02/20/2024   11:07 AM  Fall Risk  Falls in the past year? 0 0 0 0 0  Was there an injury with Fall?  0 0 0 0  Fall Risk Category Calculator  0 0 0 0  Patient at Risk for Falls Due to  No Fall Risks No Fall Risks No Fall Risks No Fall Risks  Fall risk Follow up  Education provided Falls evaluation completed Falls prevention discussed Falls evaluation completed    Functional Status Survey: Is the patient deaf or have difficulty hearing?: No Does the patient have difficulty seeing, even when wearing glasses/contacts?: No Does the patient have difficulty concentrating, remembering, or making decisions?: No Does the patient have difficulty walking or climbing stairs?: No Does the patient have difficulty dressing or bathing?: No Does the patient have difficulty doing errands alone such as visiting a doctor's office or shopping?: No    Past Medical History:  Past Medical History:  Diagnosis Date   Anemia    Anxiety    Aortic atherosclerosis (HCC)    Carotid stenosis    Depression    Dyspnea    GERD (gastroesophageal reflux disease)    Hyperlipidemia    Hypertension    Hypothyroidism    Metabolic syndrome    Microscopic hematuria    Pain in knee    Primary osteoarthritis of left knee    Vitamin D  deficiency     Surgical History:  Past Surgical History:  Procedure Laterality Date   APPENDECTOMY     COLONOSCOPY W/ POLYPECTOMY     ENDOMETRIAL BIOPSY  06/15/2009   disordered prolif. phase   FOOT NEUROMA SURGERY Left    JOINT REPLACEMENT     Left knee  02-2024. Right  knee 06-2024   KNEE ARTHROPLASTY Left 03/02/2023   Procedure: COMPUTER ASSISTED TOTAL KNEE ARTHROPLASTY - RNFA;  Surgeon: Arlyne Lame, MD;  Location: [facility] ORS;  Service: Orthopedics;  Laterality: Left;   KNEE ARTHROPLASTY Right 07/25/2023   Procedure: COMPUTER ASSISTED TOTAL KNEE ARTHROPLASTY - RNFA;  Surgeon: Arlyne Lame, MD;  Location: [facility] OR[facility];  Service: Orthopedics;  Laterality: Right;   Medications:  Current Outpatient Medications on File Prior to Visit  Medication Sig   aspirin  EC 81 MG tablet Take 1 tablet (81 mg total)  by mouth in the morning and at bedtime. Swallow whole.   atorvastatin  (LIPITOR) 40 MG tablet Take 1 tablet (40 mg total) by mouth daily.   cholecalciferol (VITAMIN D ) 1000 UNITS tablet Take 1,000 Units by mouth daily.   hydrochlorothiazide  (HYDRODIURIL ) 25 MG tablet Take 0.5 tablets (12.5 mg total) by mouth daily.   levothyroxine  (SYNTHROID ) 112 MCG tablet Take 1 tablet (112 mcg total) by mouth daily before breakfast.   losartan  (COZAAR ) 50 MG tablet Take 1 tablet (50 mg total) by mouth daily.   Multiple Vitamins-Minerals (CENTRUM SILVER 50+WOMEN PO) Take 1 tablet by mouth daily.   omeprazole  (PRILOSEC) 20 MG capsule Take 20 mg by mouth daily.   sertraline  (ZOLOFT ) 100 MG tablet Take 0.5 tablets (50 mg total) by mouth daily.   No current facility-administered medications on file prior to visit.    Allergies:  No Known Allergies  Social History:  Social History   Socioeconomic History   Marital status: Married    Spouse name: Lavonia Powers   Number of children: 2   Years of education: Not on file   Highest education level: 12th grade  Occupational History   Occupation: retired  Tobacco Use   Smoking status: Former    Current packs/day: 0.00    Average packs/day: 0.5 packs/day for 13.0 years (6.5 ttl pk-yrs)    Types: Cigarettes    Start date: 22    Quit date: 1986    Years since quitting: 39.3   Smokeless tobacco: Never   Tobacco  comments:    smoked for 12-14 years and quit cold [place]  Vaping Use   Vaping status: Never Used  Substance and Sexual Activity   Alcohol use: Not Currently    Comment: rare   Drug use: No   Sexual activity: Not Currently    Partners: Male  Other Topics Concern   Not on file  Social History Narrative   Married, retired and  used to to work At [employer] center.   Social Drivers of Corporate investment banker Strain: Low Risk  (02/19/2024)   Overall Financial Resource Strain (CARDIA)    Difficulty of Paying Living Expenses: Not hard at all  Food Insecurity: No Food Insecurity (02/19/2024)   Hunger Vital Sign    Worried About Running Out of Food in the Last Year: Never true    Ran Out of Food in the Last Year: Never true  Transportation Needs: No Transportation Needs (02/19/2024)   PRAPARE - Administrator, Civil Service (Medical): No    Lack of Transportation (Non-Medical): No  Physical Activity: Unknown (02/19/2024)   Exercise Vital Sign    Days of Exercise per Week: Patient declined    Minutes of Exercise per Session: 20 min  Stress: No Stress Concern Present (02/19/2024)   [employer] of Occupational Health - Occupational Stress Questionnaire    Feeling of Stress : Not at all  Social Connections: Unknown (02/19/2024)   Social Connection and Isolation Panel [NHANES]    Frequency of Communication with Friends and Family: More than three times a week    Frequency of Social Gatherings with Friends and Family: Once a week    Attends Religious Services: Patient declined    Database administrator or Organizations: No    Attends Banker Meetings: Patient declined    Marital Status: Married  Catering manager Violence: Not At Risk (09/11/2023)   Humiliation, Afraid, Rape, and Kick questionnaire    Fear of Current  or Ex-Partner: No    Emotionally Abused: No    Physically Abused: No    Sexually Abused: No   Social History   Tobacco Use  Smoking  Status Former   Current packs/day: 0.00   Average packs/day: 0.5 packs/day for 13.0 years (6.5 ttl pk-yrs)   Types: Cigarettes   Start date: 55   Quit date: 63   Years since quitting: 39.3  Smokeless Tobacco Never  Tobacco Comments   smoked for 12-14 years and quit cold [place]   Social History   Substance and Sexual Activity  Alcohol Use Not Currently   Comment: rare    Family History:  Family History  Problem Relation Age of Onset   Arthritis Mother    Cancer Mother 23       colon   Diabetes Father    Heart disease Father    Hypertension Father    Breast cancer Paternal Aunt    Heart disease Paternal Grandfather        MI   Heart disease Brother    Lung cancer Brother     Past medical history, surgical history, medications, allergies, family history and social history reviewed with patient today and changes made to appropriate areas of the chart.   ROS All other ROS negative except what is listed above and in the HPI.      Objective:    BP 128/78 (BP Location: Left Arm, Patient Position: Sitting, Cuff Size: Normal)   Pulse (!) 59   Temp 98.7 F (37.1 C) (Oral)   Ht 5' 3.7" (1.618 m)   Wt 203 lb 3.2 oz (92.2 kg)   LMP  (LMP Unknown)   SpO2 98%   BMI 35.21 kg/m   Wt Readings from Last 3 Encounters:  02/20/24 203 lb 3.2 oz (92.2 kg)  09/11/23 193 lb (87.5 kg)  08/21/23 203 lb 6.4 oz (92.3 kg)    Physical Exam Vitals and nursing note reviewed.  Constitutional:      General: She is awake. She is not in acute distress.    Appearance: She is well-developed and well-groomed. She is not ill-appearing or toxic-appearing.  HENT:     Head: Normocephalic and atraumatic.     Right Ear: Hearing, tympanic membrane, ear canal and external ear normal. No drainage.     Left Ear: Hearing, tympanic membrane, ear canal and external ear normal. No drainage.     Nose: Nose normal.     Right Sinus: No maxillary sinus tenderness or frontal sinus tenderness.     Left  Sinus: No maxillary sinus tenderness or frontal sinus tenderness.     Mouth/Throat:     Mouth: Mucous membranes are moist.     Pharynx: Oropharynx is clear. Uvula midline. No pharyngeal swelling, oropharyngeal exudate or posterior oropharyngeal erythema.  Eyes:     General: Lids are normal.        Right eye: No discharge.        Left eye: No discharge.     Extraocular Movements: Extraocular movements intact.     Conjunctiva/sclera: Conjunctivae normal.     Pupils: Pupils are equal, round, and reactive to light.     Visual Fields: Right eye visual fields normal and left eye visual fields normal.  Neck:     Thyroid : No thyromegaly.     Vascular: No carotid bruit.     Trachea: Trachea normal.  Cardiovascular:     Rate and Rhythm: Regular rhythm. Bradycardia present.  Heart sounds: Normal heart sounds. No murmur heard.    No gallop.  Pulmonary:     Effort: Pulmonary effort is normal. No accessory muscle usage or respiratory distress.     Breath sounds: Normal breath sounds.  Abdominal:     General: Bowel sounds are normal.     Palpations: Abdomen is soft. There is no hepatomegaly or splenomegaly.     Tenderness: There is no abdominal tenderness.  Musculoskeletal:        General: Normal range of motion.     Right hand: Swelling (mild) and tenderness present. No bony tenderness. Normal range of motion. Normal strength. Normal sensation. Normal pulse.     Left hand: Normal.     Cervical back: Normal range of motion and neck supple.     Right lower leg: No edema.     Left lower leg: No edema.  Lymphadenopathy:     Head:     Right side of head: No submental, submandibular, tonsillar, preauricular or posterior auricular adenopathy.     Left side of head: No submental, submandibular, tonsillar, preauricular or posterior auricular adenopathy.     Cervical: No cervical adenopathy.  Skin:    General: Skin is warm and dry.     Capillary Refill: Capillary refill takes less than 2 seconds.      Findings: No rash.  Neurological:     Mental Status: She is alert and oriented to person, place, and time.     Gait: Gait is intact.     Deep Tendon Reflexes: Reflexes are normal and symmetric.     Reflex Scores:      Brachioradialis reflexes are 2+ on the right side and 2+ on the left side.      Patellar reflexes are 2+ on the right side and 2+ on the left side. Psychiatric:        Attention and Perception: Attention normal.        Mood and Affect: Mood normal.        Speech: Speech normal.        Behavior: Behavior normal. Behavior is cooperative.        Thought Content: Thought content normal.        Judgment: Judgment normal.    Results for orders placed or performed in visit on 08/21/23  Comprehensive metabolic panel   Collection Time: 08/21/23 10:20 AM  Result Value Ref Range   Glucose 84 70 - 99 mg/dL   BUN 15 8 - 27 mg/dL   Creatinine, Ser 1.61 0.57 - 1.00 mg/dL   eGFR 78 >09 UE/AVW/0.98   BUN/Creatinine Ratio 18 12 - 28   Sodium 140 134 - 144 mmol/L   Potassium 3.5 3.5 - 5.2 mmol/L   Chloride 100 96 - 106 mmol/L   CO2 26 20 - 29 mmol/L   Calcium  9.6 8.7 - 10.3 mg/dL   Total Protein 6.7 6.0 - 8.5 g/dL   Albumin 4.0 3.9 - 4.9 g/dL   Globulin, Total 2.7 1.5 - 4.5 g/dL   Bilirubin Total 0.6 0.0 - 1.2 mg/dL   Alkaline Phosphatase 124 (H) 44 - 121 IU/L   AST 25 0 - 40 IU/L   ALT 15 0 - 32 IU/L  Lipid Panel w/o Chol/HDL Ratio   Collection Time: 08/21/23 10:20 AM  Result Value Ref Range   Cholesterol, Total 163 100 - 199 mg/dL   Triglycerides 119 0 - 149 mg/dL   HDL 56 >14 mg/dL   VLDL Cholesterol Cal 25  5 - 40 mg/dL   LDL Chol Calc (NIH) 82 0 - 99 mg/dL  CBC with Differential/Platelet   Collection Time: 08/21/23 10:20 AM  Result Value Ref Range   WBC 5.8 3.4 - 10.8 x10E3/uL   RBC 4.12 3.77 - 5.28 x10E6/uL   Hemoglobin 12.3 11.1 - 15.9 g/dL   Hematocrit 16.1 09.6 - 46.6 %   MCV 95 79 - 97 fL   MCH 29.9 26.6 - 33.0 pg   MCHC 31.3 (L) 31.5 - 35.7 g/dL   RDW  04.5 40.9 - 81.1 %   Platelets 265 150 - 450 x10E3/uL   Neutrophils 54 Not Estab. %   Lymphs 30 Not Estab. %   Monocytes 9 Not Estab. %   Eos 6 Not Estab. %   Basos 1 Not Estab. %   Neutrophils Absolute 3.1 1.4 - 7.0 x10E3/uL   Lymphocytes Absolute 1.8 0.7 - 3.1 x10E3/uL   Monocytes Absolute 0.5 0.1 - 0.9 x10E3/uL   EOS (ABSOLUTE) 0.4 0.0 - 0.4 x10E3/uL   Basophils Absolute 0.0 0.0 - 0.2 x10E3/uL   Immature Granulocytes 0 Not Estab. %   Immature Grans (Abs) 0.0 0.0 - 0.1 x10E3/uL      Assessment & Plan:   Problem List Items Addressed This Visit       Cardiovascular and Mediastinum   Essential hypertension, benign   Chronic, stable.  BP well below goal.  Continue current medication regimen and adjust as needed. Recommend checking BP at home three days a week + focus on DASH diet, could consider reduction of medication in future if BP stable.  LABS: CBC, CMP, TSH, urine ALB.  Urine ALB 80 April 2024, continue Losartan  for kidney protection.  Refills sent.  Return in 6 months.      Relevant Orders   Comprehensive metabolic panel with GFR   CBC with Differential/Platelet   Microalbumin, Urine Waived   Aortic atherosclerosis (HCC) - Primary   Chronic, stable.  Noted on lumbar imaging 11/04/21 -- continue statin daily and ASA daily for prevention.      Relevant Orders   Comprehensive metabolic panel with GFR   Lipid Panel w/o Chol/HDL Ratio     Endocrine   Hashimoto's thyroiditis   Chronic, stable.  Continue current medication regimen and adjust as needed.  Labs obtained today.      Relevant Orders   TSH   T4, free     Musculoskeletal and Integument   Osteopenia of neck of right femur   Ongoing.  Diagnosed on 01/23/22.  Recommend daily Vitamin D  2000 units and adequate calcium  intake.  Weight bearing exercises recommended.  Vit D level today.  Repeat DEXA around 01/24/2027.      Relevant Orders   VITAMIN D  25 Hydroxy (Vit-D Deficiency, Fractures)     Other   Proteinuria    Refer to HTN plan of care for further.      Relevant Orders   Comprehensive metabolic panel with GFR   Microalbumin, Urine Waived   Pain in both hands   To both hands R>L.  Suspect related to some underlying OA and recent lifting/packing with move.  She can take Meloxicam , has leftover at home, as needed daily for pain.  Use for short period.  Continue Tylenol  and Voltaren gel.  Recommend ice to hand as needed and obtain a compression sleeve for hand to help when she is active.  If worsening or ongoing she is to notify provider and then would obtain imaging.  Hyperlipidemia   Chronic, stable.  Continue current medication regimen and adjust as needed.  Obtain CMP and lipid panel today.      Relevant Orders   Comprehensive metabolic panel with GFR   Lipid Panel w/o Chol/HDL Ratio   Generalized anxiety disorder   Chronic, stable.  Continue Sertraline  50 MG daily and consider reduction in future.  Denies SI/HI.      Chronic depression   Chronic, stable.  Denies SI/HI.  Continue current medication regimen and adjust as needed, may reduce further in future if tolerate.  Check LFTs today.        Other Visit Diagnoses       Encounter for screening mammogram for malignant neoplasm of breast       Mammogram ordered and instructed on how to schedule.   Relevant Orders   MM 3D SCREENING MAMMOGRAM BILATERAL BREAST     Encounter for annual physical exam       Annual physical today with labs and health maintenance reviewed, discussed with patient.        Follow up plan: Return in about 6 months (around 08/21/2024) for HTN/HLD, Hypothyroid.   LABORATORY TESTING:  - Pap smear: Up To Date  IMMUNIZATIONS:   - Tdap: Tetanus vaccination status reviewed: last tetanus booster within 10 years. - Influenza: Up to date - Pneumovax: Up To Date - Prevnar:Provided today - COVID: Up to date - HPV: Not applicable - Shingrix vaccine: Up to date  SCREENING: -Mammogram: Up to date -- due  04/23/23 - Colonoscopy: Up to date  - Bone Density: Up to date  -Hearing Test: Not applicable  -Spirometry: Not applicable   PATIENT COUNSELING:   Advised to take 1 mg of folate supplement per day if capable of pregnancy.   Sexuality: Discussed sexually transmitted diseases, partner selection, use of condoms, avoidance of unintended pregnancy  and contraceptive alternatives.   Advised to avoid cigarette smoking.  I discussed with the patient that most people either abstain from alcohol or drink within safe limits (<=14/week and <=4 drinks/occasion for males, <=7/weeks and <= 3 drinks/occasion for females) and that the risk for alcohol disorders and other health effects rises proportionally with the number of drinks per week and how often a drinker exceeds daily limits.  Discussed cessation/primary prevention of drug use and availability of treatment for abuse.   Diet: Encouraged to adjust caloric intake to maintain  or achieve ideal body weight, to reduce intake of dietary saturated fat and total fat, to limit sodium intake by avoiding high sodium foods and not adding table salt, and to maintain adequate dietary potassium and calcium  preferably from fresh fruits, vegetables, and low-fat dairy products.    Stressed the importance of regular exercise  Injury prevention: Discussed safety belts, safety helmets, smoke detector, smoking near bedding or upholstery.   Dental health: Discussed importance of regular tooth brushing, flossing, and dental visits.    NEXT PREVENTATIVE PHYSICAL DUE IN 1 YEAR. Return in about 6 months (around 08/21/2024) for HTN/HLD, Hypothyroid.

## 2024-02-20 NOTE — Assessment & Plan Note (Signed)
Refer to [facility] plan of care for further. 

## 2024-02-20 NOTE — Assessment & Plan Note (Signed)
Chronic, stable.  Continue current medication regimen and adjust as needed.  Labs obtained today.

## 2024-02-20 NOTE — Assessment & Plan Note (Signed)
 To both hands R>L.  Suspect related to some underlying OA and recent lifting/packing with move.  She can take Meloxicam , has leftover at home, as needed daily for pain.  Use for short period.  Continue Tylenol  and Voltaren gel.  Recommend ice to hand as needed and obtain a compression sleeve for hand to help when she is active.  If worsening or ongoing she is to notify provider and then would obtain imaging.

## 2024-02-20 NOTE — Assessment & Plan Note (Addendum)
 Chronic, stable.  Noted on lumbar imaging 11/04/21 -- continue statin daily and ASA daily for prevention.

## 2024-02-20 NOTE — Assessment & Plan Note (Signed)
Chronic, stable.  Continue Sertraline 50 MG daily and consider reduction in future.  Denies SI/HI.

## 2024-02-20 NOTE — Assessment & Plan Note (Signed)
 Chronic, stable.  Denies SI/HI.  Continue current medication regimen and adjust as needed, may reduce further in future if tolerate.  Check LFTs today.

## 2024-02-20 NOTE — Assessment & Plan Note (Signed)
 Ongoing.  Diagnosed on 01/23/22.  Recommend daily Vitamin D  2000 units and adequate calcium  intake.  Weight bearing exercises recommended.  Vit D level today.  Repeat DEXA around 01/24/2027.

## 2024-02-20 NOTE — Assessment & Plan Note (Signed)
Chronic, stable.  Continue current medication regimen and adjust as needed.  Obtain CMP and lipid panel today. 

## 2024-02-20 NOTE — Assessment & Plan Note (Signed)
 Chronic, stable.  BP well below goal.  Continue current medication regimen and adjust as needed. Recommend checking BP at home three days a week + focus on DASH diet, could consider reduction of medication in future if BP stable.  LABS: CBC, CMP, TSH, urine ALB.  Urine ALB 80 April 2024, continue Losartan  for kidney protection.  Refills sent.  Return in 6 months.

## 2024-02-21 ENCOUNTER — Encounter: Payer: Self-pay | Admitting: Nurse Practitioner

## 2024-02-21 LAB — COMPREHENSIVE METABOLIC PANEL WITH GFR
ALT: 11 IU/L (ref 0–32)
AST: 19 IU/L (ref 0–40)
Albumin: 4.2 g/dL (ref 3.9–4.9)
Alkaline Phosphatase: 118 IU/L (ref 44–121)
BUN/Creatinine Ratio: 17 (ref 12–28)
BUN: 15 mg/dL (ref 8–27)
Bilirubin Total: 0.4 mg/dL (ref 0.0–1.2)
CO2: 23 mmol/L (ref 20–29)
Calcium: 9.3 mg/dL (ref 8.7–10.3)
Chloride: 102 mmol/L (ref 96–106)
Creatinine, Ser: 0.87 mg/dL (ref 0.57–1.00)
Globulin, Total: 2.6 g/dL (ref 1.5–4.5)
Glucose: 88 mg/dL (ref 70–99)
Potassium: 4 mmol/L (ref 3.5–5.2)
Sodium: 140 mmol/L (ref 134–144)
Total Protein: 6.8 g/dL (ref 6.0–8.5)
eGFR: 73 mL/min/{1.73_m2} (ref >59)

## 2024-02-21 LAB — CBC WITH DIFFERENTIAL/PLATELET
Basophils Absolute: 0.1 10*3/uL (ref 0.0–0.2)
Basos: 1 %
EOS (ABSOLUTE): 0.4 10*3/uL (ref 0.0–0.4)
Eos: 7 %
Hematocrit: 42.6 % (ref 34.0–46.6)
Hemoglobin: 13.4 g/dL (ref 11.1–15.9)
Immature Grans (Abs): 0 10*3/uL (ref 0.0–0.1)
Immature Granulocytes: 0 %
Lymphocytes Absolute: 2.4 10*3/uL (ref 0.7–3.1)
Lymphs: 41 %
MCH: 29.3 pg (ref 26.6–33.0)
MCHC: 31.5 g/dL (ref 31.5–35.7)
MCV: 93 fL (ref 79–97)
Monocytes Absolute: 0.6 10*3/uL (ref 0.1–0.9)
Monocytes: 10 %
Neutrophils Absolute: 2.3 10*3/uL (ref 1.4–7.0)
Neutrophils: 41 %
Platelets: 244 10*3/uL (ref 150–450)
RBC: 4.58 x10E6/uL (ref 3.77–5.28)
RDW: 13 % (ref 11.7–15.4)
WBC: 5.6 10*3/uL (ref 3.4–10.8)

## 2024-02-21 LAB — LIPID PANEL W/O CHOL/HDL RATIO
Cholesterol, Total: 159 mg/dL (ref 100–199)
HDL: 53 mg/dL (ref >39)
LDL Chol Calc (NIH): 82 mg/dL (ref 0–99)
Triglycerides: 135 mg/dL (ref 0–149)
VLDL Cholesterol Cal: 24 mg/dL (ref 5–40)

## 2024-02-21 LAB — TSH: TSH: 0.737 u[IU]/mL (ref 0.450–4.500)

## 2024-02-21 LAB — T4, FREE: Free T4: 1.57 ng/dL (ref 0.82–1.77)

## 2024-02-21 LAB — VITAMIN D 25 HYDROXY (VIT D DEFICIENCY, FRACTURES): Vit D, 25-Hydroxy: 35.9 ng/mL (ref 30.0–100.0)

## 2024-02-21 NOTE — Progress Notes (Signed)
 Contacted via MyChart   Good afternoon Leslie Turner, your labs have returned and WOW.  These look fantastic.  No medication changes needed.  Great job!!! Keep being amazing!!  Thank you for allowing me to participate in your care.  I appreciate you. Kindest regards, Markell Schrier

## 2024-04-05 ENCOUNTER — Other Ambulatory Visit: Payer: Self-pay | Admitting: Nurse Practitioner

## 2024-04-07 NOTE — Telephone Encounter (Signed)
 Unable to refill per protocol, Rx expired. Discontinued 07/18/23.  Requested Prescriptions  Pending Prescriptions Disp Refills   meloxicam  (MOBIC ) 7.5 MG tablet [Pharmacy Med Name: MELOXICAM  TAB 7.5MG ] 60 tablet 3    Sig: TAKE 1 TABLET DAILY     Analgesics:  COX2 Inhibitors Failed - 04/07/2024  1:51 PM      Failed - Manual Review: Labs are only required if the patient has taken medication for more than 8 weeks.      Passed - HGB in normal range and within 360 days    Hemoglobin  Date Value Ref Range Status  02/20/2024 13.4 11.1 - 15.9 g/dL Final         Passed - Cr in normal range and within 360 days    Creatinine, Ser  Date Value Ref Range Status  02/20/2024 0.87 0.57 - 1.00 mg/dL Final         Passed - HCT in normal range and within 360 days    Hematocrit  Date Value Ref Range Status  02/20/2024 42.6 34.0 - 46.6 % Final         Passed - AST in normal range and within 360 days    AST  Date Value Ref Range Status  02/20/2024 19 0 - 40 IU/L Final         Passed - ALT in normal range and within 360 days    ALT  Date Value Ref Range Status  02/20/2024 11 0 - 32 IU/L Final         Passed - eGFR is 30 or above and within 360 days    GFR calc Af Amer  Date Value Ref Range Status  08/10/2020 88 >59 mL/min/1.73 Final    Comment:    **[facility] currently reports eGFR in compliance with the current**   recommendations of the [employer]. [facility] will   update reporting as new guidelines are published from the NKF-ASN   Task force.    GFR, Estimated  Date Value Ref Range Status  07/18/2023 >60 >60 mL/min Final    Comment:    (NOTE) Calculated using the CKD-EPI Creatinine Equation (2021)    eGFR  Date Value Ref Range Status  02/20/2024 73 >59 mL/min/1.73 Final         Passed - Patient is not pregnant      Passed - Valid encounter within last 12 months    Recent Outpatient Visits           1 month ago Aortic atherosclerosis (HCC)   [place]  [facility] [place], Lavelle Posey, NP

## 2024-06-26 ENCOUNTER — Telehealth: Payer: Self-pay

## 2024-06-26 DIAGNOSIS — Z1231 Encounter for screening mammogram for malignant neoplasm of breast: Secondary | ICD-10-CM

## 2024-06-26 NOTE — Telephone Encounter (Signed)
 Copied from CRM #[id number]. Topic: Clinical - Request for Lab/Test Order >> Jun 26, 2024 12:45 PM Ivette P wrote: Reason for CRM: Pt called in about getting mammogram order in so she can get it scheduled.   [facility] Address: [address], [place], [place] [address] Phone: [phone]

## 2024-07-04 NOTE — Telephone Encounter (Signed)
 Order completed and message sent to patient via mychart in order to allow her to self schedule her mammogram as requested.

## 2024-07-05 ENCOUNTER — Encounter: Payer: Self-pay | Admitting: Nurse Practitioner

## 2024-07-24 LAB — HM MAMMOGRAPHY

## 2024-07-27 ENCOUNTER — Encounter: Payer: Self-pay | Admitting: Nurse Practitioner

## 2024-07-28 NOTE — Patient Instructions (Signed)
 Be Involved in Caring For Your Health:  Taking Medications When medications are taken as directed, they can greatly improve your health. But if they are not taken as prescribed, they may not work. In some cases, not taking them correctly can be harmful. To help ensure your treatment remains effective and safe, understand your medications and how to take them. Bring your medications to each visit for review by your provider.  Your lab results, notes, and after visit summary will be available on My Chart. We strongly encourage you to use this feature. If lab results are abnormal the clinic will contact you with the appropriate steps. If the clinic does not contact you assume the results are satisfactory. You can always view your results on My Chart. If you have questions regarding your health or results, please contact the clinic during office hours. You can also ask questions on My Chart.  We at [facility] are grateful that you chose us  to provide your care. We strive to provide evidence-based and compassionate care and are always looking for feedback. If you get a survey from the clinic please complete this so we can hear your opinions.   Sinus Infection, Adult A sinus infection is soreness and swelling (inflammation) of your sinuses. Sinuses are hollow spaces in the bones around your face. They are located: Around your eyes. In the middle of your forehead. Behind your nose. In your cheekbones. Your sinuses and nasal passages are lined with a fluid called mucus. Mucus drains out of your sinuses. Swelling can trap mucus in your sinuses. This lets germs (bacteria, virus, or fungus) grow, which leads to infection. Most of the time, this condition is caused by a virus. What are the causes? Allergies. Asthma. Germs. Things that block your nose or sinuses. Growths in the nose (nasal polyps). Chemicals or irritants in the air. A fungus. This is rare. What increases the risk? Having a  weak body defense system (immune system). Doing a lot of swimming or diving. Using nasal sprays too much. Smoking. What are the signs or symptoms? The main symptoms of this condition are pain and a feeling of pressure around the sinuses. Other symptoms include: Stuffy nose (congestion). This may make it hard to breathe through your nose. Runny nose (drainage). Soreness, swelling, and warmth in the sinuses. A cough that may get worse at night. Being unable to smell and taste. Mucus that collects in the throat or the back of the nose (postnasal drip). This may cause a sore throat or bad breath. Being very tired (fatigued). A fever. How is this diagnosed? Your symptoms. Your medical history. A physical exam. Tests to find out if your condition is short-term (acute) or long-term (chronic). Your doctor may: Check your nose for growths (polyps). Check your sinuses using a tool that has a light on one end (endoscope). Check for allergies or germs. Do imaging tests, such as an MRI or CT scan. How is this treated? Treatment for this condition depends on the cause and whether it is short-term or long-term. If caused by a virus, your symptoms should go away on their own within 10 days. You may be given medicines to relieve symptoms. They include: Medicines that shrink swollen tissue in the nose. A spray that treats swelling of the nostrils. Rinses that help get rid of thick mucus in your nose (nasal saline washes). Medicines that treat allergies (antihistamines). Over-the-counter pain relievers. If caused by bacteria, your doctor may wait to see if you will  get better without treatment. You may be given antibiotic medicine if you have: A very bad infection. A weak body defense system. If caused by growths in the nose, surgery may be needed. Follow these instructions at home: Medicines Take, use, or apply over-the-counter and prescription medicines only as told by your doctor. These may  include nasal sprays. If you were prescribed an antibiotic medicine, take it as told by your doctor. Do not stop taking it even if you start to feel better. Hydrate and humidify  Drink enough water to keep your pee (urine) pale yellow. Use a cool mist humidifier to keep the humidity level in your home above 50%. Breathe in steam for 10-15 minutes, 3-4 times a day, or as told by your doctor. You can do this in the bathroom while a hot shower is running. Try not to spend time in cool or dry air. Rest Rest as much as you can. Sleep with your head raised (elevated). Make sure you get enough sleep each night. General instructions  Put a warm, moist washcloth on your face 3-4 times a day, or as often as told by your doctor. Use nasal saline washes as often as told by your doctor. Wash your hands often with soap and water. If you cannot use soap and water, use hand sanitizer. Do not smoke. Avoid being around people who are smoking (secondhand smoke). Keep all follow-up visits. Contact a doctor if: You have a fever. Your symptoms get worse. Your symptoms do not get better within 10 days. Get help right away if: You have a very bad headache. You cannot stop vomiting. You have very bad pain or swelling around your face or eyes. You have trouble seeing. You feel confused. Your neck is stiff. You have trouble breathing. These symptoms may be an emergency. Get help right away. Call 911. Do not wait to see if the symptoms will go away. Do not drive yourself to the hospital. Summary A sinus infection is swelling of your sinuses. Sinuses are hollow spaces in the bones around your face. This condition is caused by tissues in your nose that become inflamed or swollen. This traps germs. These can lead to infection. If you were prescribed an antibiotic medicine, take it as told by your doctor. Do not stop taking it even if you start to feel better. Keep all follow-up visits. This information is  not intended to replace advice given to you by your health care provider. Make sure you discuss any questions you have with your health care provider. Document Revised: 09/20/2021 Document Reviewed: 09/20/2021 Elsevier Patient Education  2024 [employer].

## 2024-07-29 ENCOUNTER — Ambulatory Visit: Admitting: Nurse Practitioner

## 2024-07-29 ENCOUNTER — Telehealth (INDEPENDENT_AMBULATORY_CARE_PROVIDER_SITE_OTHER): Admitting: Nurse Practitioner

## 2024-07-29 ENCOUNTER — Encounter: Payer: Self-pay | Admitting: Nurse Practitioner

## 2024-07-29 DIAGNOSIS — J0110 Acute frontal sinusitis, unspecified: Secondary | ICD-10-CM | POA: Diagnosis not present

## 2024-07-29 MED ORDER — AZITHROMYCIN 250 MG PO TABS: ORAL_TABLET | ORAL | 0 refills | Status: AC

## 2024-07-29 MED ORDER — PREDNISONE 20 MG PO TABS
40.0000 mg | ORAL_TABLET | Freq: Every day | ORAL | 0 refills | Status: AC
Start: 2024-07-29 — End: 2024-08-03

## 2024-07-29 NOTE — Assessment & Plan Note (Signed)
 Acute and present one week with symptoms, not improving.  Will start Zpack and Prednisone . Educated patient on this.  Recommend: - Increased rest - Increasing Fluids - Acetaminophen  / ibuprofen as needed for fever/pain.  - Salt water gargling, chloraseptic spray and throat lozenges - Mucinex.  - Saline sinus flushes or a neti pot.  - Humidifying the air.

## 2024-07-29 NOTE — Progress Notes (Signed)
 LMP  (LMP Unknown)    Subjective:    Patient ID: Leslie Turner, female    DOB: 12-Mar-1956, 68 y.o.   MRN: [id number]  HPI: Leslie Turner is a 68 y.o. female  Chief Complaint  Patient presents with   URI    Started late Thursday. Positive for congestion, sinus headache, coughing and not able to sleep, sinus pressure, sneezing, post nasal drip, coughing up phlegm deep green in color, no fevers, no body aches/chills. Sudafed offered little help.    Virtual Visit via Video Note  I connected with Leslie Turner on 07/29/24 at  1:00 PM EDT by a video enabled telemedicine application and verified that I am speaking with the correct person using two identifiers.  Location: Patient: home Provider: work   I discussed the limitations of evaluation and management by telemedicine and the availability of in person appointments. The patient expressed understanding and agreed to proceed.  I discussed the assessment and treatment plan with the patient. The patient was provided an opportunity to ask questions and all were answered. The patient agreed with the plan and demonstrated an understanding of the instructions.   The patient was advised to call back or seek an in-person evaluation if the symptoms worsen or if the condition fails to improve as anticipated.  I provided 25 minutes of non-face-to-face time during this encounter.   Elva Breaker T Shaine Mount, NP   UPPER RESPIRATORY TRACT INFECTION Started about one week ago with drainage and sinus headache. Did not perform any Covid testing at home.  Her daughter had negative Covid testing recently.  She lives with them. Both grand kids were sick recently. Fever: no Cough: yes deep green and thick Shortness of breath: no Wheezing: no Chest pain: no Chest tightness: no Chest congestion: yes Nasal congestion: yes Runny nose: yes Post nasal drip: yes Sneezing: no Sore throat: no Swollen glands: no Sinus pressure: yes Headache: yes Face  pain: no Toothache: no Ear pain: none Ear pressure: none Eyes red/itching:no Eye drainage/crusting: no  Vomiting: no Rash: no Fatigue: yes Sick contacts: yes Strep contacts: no  Context: fluctuating Recurrent sinusitis: no Relief with OTC cold/cough medications: no  Treatments attempted: Sudafed, took one time and cut in half    Relevant past medical, surgical, family and social history reviewed and updated as indicated. Interim medical history since our last visit reviewed. Allergies and medications reviewed and updated.  Review of Systems  Constitutional:  Positive for fatigue. Negative for activity change, appetite change, chills and fever.  HENT:  Positive for congestion, postnasal drip, rhinorrhea, sinus pressure and sinus pain. Negative for ear discharge, ear pain, facial swelling, sneezing, sore throat and voice change.   Respiratory:  Positive for cough. Negative for chest tightness, shortness of breath and wheezing.   Cardiovascular:  Negative for chest pain, palpitations and leg swelling.  Gastrointestinal: Negative.   Neurological:  Positive for headaches. Negative for dizziness and numbness.  Psychiatric/Behavioral: Negative.     Per HPI unless specifically indicated above     Objective:    LMP  (LMP Unknown)   Wt Readings from Last 3 Encounters:  02/20/24 203 lb 3.2 oz (92.2 kg)  09/11/23 193 lb (87.5 kg)  08/21/23 203 lb 6.4 oz (92.3 kg)    Physical Exam Vitals and nursing note reviewed.  Constitutional:      General: She is awake. She is not in acute distress.    Appearance: She is well-developed and well-groomed. She is ill-appearing. She  is not toxic-appearing.  HENT:     Head: Normocephalic.     Right Ear: Hearing normal.     Left Ear: Hearing normal.  Eyes:     General: Lids are normal.        Right eye: No discharge.        Left eye: No discharge.     Conjunctiva/sclera: Conjunctivae normal.  Pulmonary:     Effort: Pulmonary effort is normal.  No accessory muscle usage or respiratory distress.  Musculoskeletal:     Cervical back: Normal range of motion.  Neurological:     Mental Status: She is alert and oriented to person, place, and time.  Psychiatric:        Attention and Perception: Attention normal.        Mood and Affect: Mood normal.        Behavior: Behavior normal. Behavior is cooperative.        Thought Content: Thought content normal.        Judgment: Judgment normal.     Results for orders placed or performed in visit on 07/25/24  [facility] MAMMOGRAPHY   Collection Time: 07/24/24  2:41 PM  Result Value Ref Range   HM Mammogram 0-4 Bi-Rad 0-4 Bi-Rad, Self Reported Normal      Assessment & Plan:   Problem List Items Addressed This Visit       Respiratory   Acute frontal sinusitis - Primary   Acute and present one week with symptoms, not improving.  Will start Zpack and Prednisone . Educated patient on this.  Recommend: - Increased rest - Increasing Fluids - Acetaminophen  / ibuprofen as needed for fever/pain.  - Salt water gargling, chloraseptic spray and throat lozenges - Mucinex.  - Saline sinus flushes or a neti pot.  - Humidifying the air.       Relevant Medications   azithromycin (ZITHROMAX) 250 MG tablet   predniSONE  (DELTASONE ) 20 MG tablet     Follow up plan: Return if symptoms worsen or fail to improve.

## 2024-08-18 NOTE — Patient Instructions (Signed)
 Be Involved in Caring For Your Health:  Taking Medications When medications are taken as directed, they can greatly improve your health. But if they are not taken as prescribed, they may not work. In some cases, not taking them correctly can be harmful. To help ensure your treatment remains effective and safe, understand your medications and how to take them. Bring your medications to each visit for review by your provider.  Your lab results, notes, and after visit summary will be available on My Chart. We strongly encourage you to use this feature. If lab results are abnormal the clinic will contact you with the appropriate steps. If the clinic does not contact you assume the results are satisfactory. You can always view your results on My Chart. If you have questions regarding your health or results, please contact the clinic during office hours. You can also ask questions on My Chart.  We at [facility] are grateful that you chose us  to provide your care. We strive to provide evidence-based and compassionate care and are always looking for feedback. If you get a survey from the clinic please complete this so we can hear your opinions.  DASH Eating Plan DASH stands for Dietary Approaches to Stop Hypertension. The DASH eating plan is a healthy eating plan that has been shown to: Lower high blood pressure (hypertension). Reduce your risk for type 2 diabetes, heart disease, and stroke. Help with weight loss. What are tips for following this plan? Reading food labels Check food labels for the amount of salt (sodium) per serving. Choose foods with less than 5 percent of the Daily Value (DV) of sodium. In general, foods with less than 300 milligrams (mg) of sodium per serving fit into this eating plan. To find whole grains, look for the word whole as the first word in the ingredient list. Shopping Buy products labeled as low-sodium or no salt added. Buy fresh foods. Avoid canned  foods and pre-made or frozen meals. Cooking Try not to add salt when you cook. Use salt-free seasonings or herbs instead of table salt or sea salt. Check with your health care provider or pharmacist before using salt substitutes. Do not fry foods. Cook foods in healthy ways, such as baking, boiling, grilling, roasting, or broiling. Cook using oils that are good for your heart. These include olive, canola, avocado, soybean, and sunflower oil. Meal planning  Eat a balanced diet. This should include: 4 or more servings of fruits and 4 or more servings of vegetables each day. Try to fill half of your plate with fruits and vegetables. 6-8 servings of whole grains each day. 6 or less servings of lean meat, poultry, or fish each day. 1 oz is 1 serving. A 3 oz (85 g) serving of meat is about the same size as the palm of your hand. One egg is 1 oz (28 g). 2-3 servings of low-fat dairy each day. One serving is 1 cup (237 mL). 1 serving of nuts, seeds, or beans 5 times each week. 2-3 servings of heart-healthy fats. Healthy fats called omega-3 fatty acids are found in foods such as walnuts, flaxseeds, fortified milks, and eggs. These fats are also found in cold-water fish, such as sardines, salmon, and mackerel. Limit how much you eat of: Canned or prepackaged foods. Food that is high in trans fat, such as fried foods. Food that is high in saturated fat, such as fatty meat. Desserts and other sweets, sugary drinks, and other foods with added sugar. Full-fat  dairy products. Do not salt foods before eating. Do not eat more than 4 egg yolks a week. Try to eat at least 2 vegetarian meals a week. Eat more home-cooked food and less restaurant, buffet, and fast food. Lifestyle When eating at a restaurant, ask if your food can be made with less salt or no salt. If you drink alcohol: Limit how much you have to: 0-1 drink a day if you are female. 0-2 drinks a day if you are female. Know how much alcohol is in  your drink. In the [place] one drink is one 12 oz bottle of beer (355 mL), one 5 oz glass of wine (148 mL), or one 1 oz glass of hard liquor (44 mL). General information Avoid eating more than 2,300 mg of salt a day. If you have hypertension, you may need to reduce your sodium intake to 1,500 mg a day. Work with your provider to stay at a healthy body weight or lose weight. Ask what the best weight range is for you. On most days of the week, get at least 30 minutes of exercise that causes your heart to beat faster. This may include walking, swimming, or biking. Work with your provider or dietitian to adjust your eating plan to meet your specific calorie needs. What foods should I eat? Fruits All fresh, dried, or frozen fruit. Canned fruits that are in their natural juice and do not have sugar added to them. Vegetables Fresh or frozen vegetables that are raw, steamed, roasted, or grilled. Low-sodium or reduced-sodium tomato and vegetable juice. Low-sodium or reduced-sodium tomato sauce and tomato paste. Low-sodium or reduced-sodium canned vegetables. Grains Whole-grain or whole-wheat bread. Whole-grain or whole-wheat pasta. Brown rice. [place] Madeira. Bulgur. Whole-grain and low-sodium cereals. Pita bread. Low-fat, low-sodium crackers. Whole-wheat flour tortillas. Meats and other proteins Skinless chicken or [place]. Ground chicken or [place]. Pork with fat trimmed off. Fish and seafood. Egg whites. Dried beans, peas, or lentils. Unsalted nuts, nut butters, and seeds. Unsalted canned beans. Lean cuts of beef with fat trimmed off. Low-sodium, lean precooked or cured meat, such as sausages or meat loaves. Dairy Low-fat (1%) or fat-free (skim) milk. Reduced-fat, low-fat, or fat-free cheeses. Nonfat, low-sodium ricotta or cottage cheese. Low-fat or nonfat yogurt. Low-fat, low-sodium cheese. Fats and oils Soft margarine without trans fats. Vegetable oil. Reduced-fat, low-fat, or light mayonnaise and salad  dressings (reduced-sodium). Canola, safflower, olive, avocado, soybean, and sunflower oils. Avocado. Seasonings and condiments Herbs. Spices. Seasoning mixes without salt. Other foods Unsalted popcorn and pretzels. Fat-free sweets. The items listed above may not be all the foods and drinks you can have. Talk to a dietitian to learn more. What foods should I avoid? Fruits Canned fruit in a light or heavy syrup. Fried fruit. Fruit in cream or butter sauce. Vegetables Creamed or fried vegetables. Vegetables in a cheese sauce. Regular canned vegetables that are not marked as low-sodium or reduced-sodium. Regular canned tomato sauce and paste that are not marked as low-sodium or reduced-sodium. Regular tomato and vegetable juices that are not marked as low-sodium or reduced-sodium. Dene. Olives. Grains Baked goods made with fat, such as croissants, muffins, or some breads. Dry pasta or rice meal packs. Meats and other proteins Fatty cuts of meat. Ribs. Fried meat. [place]. Bologna, salami, and other precooked or cured meats, such as sausages or meat loaves, that are not lean and low in sodium. Fat from the back of a pig (fatback). Bratwurst. Salted nuts and seeds. Canned beans with added salt. Canned  or smoked fish. Whole eggs or egg yolks. Chicken or [place] with skin. Dairy Whole or 2% milk, cream, and half-and-half. Whole or full-fat cream cheese. Whole-fat or sweetened yogurt. Full-fat cheese. Nondairy creamers. Whipped toppings. Processed cheese and cheese spreads. Fats and oils Butter. Stick margarine. Lard. Shortening. Ghee. Bacon fat. Tropical oils, such as coconut, palm kernel, or palm oil. Seasonings and condiments Onion salt, garlic salt, seasoned salt, table salt, and sea salt. Worcestershire sauce. Tartar sauce. Barbecue sauce. Teriyaki sauce. Soy sauce, including reduced-sodium soy sauce. Steak sauce. Canned and packaged gravies. Fish sauce. Oyster sauce. Cocktail sauce. Store-bought  horseradish. Ketchup. Mustard. Meat flavorings and tenderizers. Bouillon cubes. Hot sauces. Pre-made or packaged marinades. Pre-made or packaged taco seasonings. Relishes. Regular salad dressings. Other foods Salted popcorn and pretzels. The items listed above may not be all the foods and drinks you should avoid. Talk to a dietitian to learn more. Where to find more information National Heart, Lung, and Blood Institute (NHLBI): BuffaloDryCleaner.gl American Heart Association (AHA): heart.org Academy of Nutrition and Dietetics: eatright.org National Kidney Foundation (NKF): kidney.org This information is not intended to replace advice given to you by your health care provider. Make sure you discuss any questions you have with your health care provider. Document Revised: 11/02/2022 Document Reviewed: 11/02/2022 Elsevier Patient Education  2024 [employer].

## 2024-08-21 ENCOUNTER — Encounter: Payer: Self-pay | Admitting: Nurse Practitioner

## 2024-08-21 ENCOUNTER — Ambulatory Visit (INDEPENDENT_AMBULATORY_CARE_PROVIDER_SITE_OTHER): Payer: PRIVATE HEALTH INSURANCE | Admitting: Nurse Practitioner

## 2024-08-21 VITALS — BP 132/80 | HR 60 | Temp 98.0°F | Ht 63.7 in | Wt 203.6 lb

## 2024-08-21 DIAGNOSIS — I700 Atherosclerosis of aorta: Secondary | ICD-10-CM

## 2024-08-21 DIAGNOSIS — M65342 Trigger finger, left ring finger: Secondary | ICD-10-CM | POA: Insufficient documentation

## 2024-08-21 DIAGNOSIS — F411 Generalized anxiety disorder: Secondary | ICD-10-CM | POA: Diagnosis not present

## 2024-08-21 DIAGNOSIS — I10 Essential (primary) hypertension: Secondary | ICD-10-CM | POA: Diagnosis not present

## 2024-08-21 DIAGNOSIS — R399 Unspecified symptoms and signs involving the genitourinary system: Secondary | ICD-10-CM

## 2024-08-21 DIAGNOSIS — E063 Autoimmune thyroiditis: Secondary | ICD-10-CM

## 2024-08-21 DIAGNOSIS — E7800 Pure hypercholesterolemia, unspecified: Secondary | ICD-10-CM | POA: Diagnosis not present

## 2024-08-21 DIAGNOSIS — K219 Gastro-esophageal reflux disease without esophagitis: Secondary | ICD-10-CM

## 2024-08-21 DIAGNOSIS — F32A Depression, unspecified: Secondary | ICD-10-CM | POA: Diagnosis not present

## 2024-08-21 LAB — URINALYSIS, ROUTINE W REFLEX MICROSCOPIC
Bilirubin, UA: NEGATIVE
Glucose, UA: NEGATIVE
Ketones, UA: NEGATIVE
Leukocytes,UA: NEGATIVE
Nitrite, UA: NEGATIVE
Protein,UA: NEGATIVE
Specific Gravity, UA: 1.02 (ref 1.005–1.030)
Urobilinogen, Ur: 0.2 mg/dL (ref 0.2–1.0)
pH, UA: 7.5 (ref 5.0–7.5)

## 2024-08-21 LAB — MICROSCOPIC EXAMINATION
Bacteria, UA: NONE SEEN
WBC, UA: NONE SEEN /HPF (ref 0–5)

## 2024-08-21 MED ORDER — LEVOTHYROXINE SODIUM 112 MCG PO TABS: 112.0000 ug | ORAL_TABLET | Freq: Every day | ORAL | 4 refills | Status: AC

## 2024-08-21 MED ORDER — ATORVASTATIN CALCIUM 40 MG PO TABS: 40.0000 mg | ORAL_TABLET | Freq: Every day | ORAL | 4 refills | Status: AC

## 2024-08-21 MED ORDER — HYDROCHLOROTHIAZIDE 25 MG PO TABS: 12.5000 mg | ORAL_TABLET | Freq: Every day | ORAL | 4 refills | Status: AC

## 2024-08-21 MED ORDER — LOSARTAN POTASSIUM 50 MG PO TABS: 50.0000 mg | ORAL_TABLET | Freq: Every day | ORAL | 4 refills | Status: AC

## 2024-08-21 NOTE — Assessment & Plan Note (Signed)
Chronic, stable.  Continue current medication regimen and adjust as needed.  Obtain CMP and lipid panel today. 

## 2024-08-21 NOTE — Assessment & Plan Note (Signed)
 Chronic.  Continue OTC Omeprazole  for now and consider reduction in future. Risks of PPI use were discussed with patient including bone loss, C. Diff diarrhea, pneumonia, infections, CKD, electrolyte abnormalities. Verbalizes understanding and chooses to continue the medication. Mag level at physical.

## 2024-08-21 NOTE — Assessment & Plan Note (Signed)
 Chronic, stable.  BP at goal.  Continue current medication regimen and adjust as needed. Recommend checking BP at home three days a week + focus on DASH diet, could consider reduction of medication in future if BP stable.  LABS: BMP.  Urine ALB 07 February 2024, continue Losartan  for kidney protection.  Refills sent.  Return in 6 months.

## 2024-08-21 NOTE — Assessment & Plan Note (Signed)
 Chronic, stable.  Denies SI/HI.  Continue current medication regimen and adjust as needed, may reduce further in future if tolerate.  Check LFTs today.

## 2024-08-21 NOTE — Assessment & Plan Note (Signed)
 Chronic, stable.  Noted on lumbar imaging 11/04/21 -- continue statin daily and ASA daily for prevention.

## 2024-08-21 NOTE — Assessment & Plan Note (Signed)
 Chronic, stable.  Continue current medication regimen and adjust as needed.  Labs due at physical in April 2026.

## 2024-08-21 NOTE — Assessment & Plan Note (Signed)
Chronic, stable.  Continue Sertraline 50 MG daily and consider reduction in future.  Denies SI/HI.

## 2024-08-21 NOTE — Progress Notes (Signed)
 BP 132/80   Pulse 60   Temp 98 F (36.7 C) (Oral)   Ht 5' 3.7 (1.618 m)   Wt 203 lb 9.6 oz (92.4 kg)   LMP  (LMP Unknown)   SpO2 98%   BMI 35.28 kg/m    Subjective:    Patient ID: Leslie Turner, female    DOB: August 14, 1956, 68 y.o.   MRN: [id number]  HPI: Leslie Turner is a 68 y.o. female  Chief Complaint  Patient presents with   Hypertension   Hyperlipidemia   Hypothyroidism   PELVIC CONCERNS    Complains of pelvic pressure. Onset 2 days ago. Denies pain or dysuria.    Getting trigger finger on occasion to left 4th finger. Has taken ring off and not noticing it as much.  Was doing it two to three nights a week, but none since taking off ring.  HYPERTENSION / HYPERLIPIDEMIA Taking Losartan , HCTZ and Atorvastatin .  Aortic atherosclerosis noted on past imaging. Satisfied with current treatment? yes Duration of hypertension: chronic BP monitoring frequency: not checking BP range:  BP medication side effects: no Past BP meds: HCTZ and losartan  (cozaar ) Duration of hyperlipidemia: chronic Cholesterol medication side effects: no Cholesterol supplements: none Past cholesterol medications: atorvastatin  (lipitor) Medication compliance: good compliance Aspirin : no Recent stressors: no Recurrent headaches: no Visual changes: no Palpitations: no Dyspnea: no Chest pain: no Lower extremity edema: no Dizzy/lightheaded: occasional if gets up too fast   HYPOTHYROIDISM Taking Levothyroxine  112 MCG. Continues Omeprazole  20 MG, now has to take daily . Gets from OTC. This does offer benefit. Thyroid  control status:controlled Satisfied with current treatment? yes Medication side effects: no Medication compliance: good compliance Etiology of hypothyroidism: Hashimoto's Recent dose adjustment:no Fatigue: no Cold intolerance: no Heat intolerance: no Weight gain: no Weight loss: no Constipation: no Diarrhea/loose stools: no Palpitations: no Lower extremity edema:  no Anxiety/depressed mood: no   URINARY SYMPTOMS Been having pelvic pinch sensation over past two days. Dysuria: no Urinary frequency: no Urgency: no Small volume voids: no Symptom severity: no Urinary incontinence: sometimes Foul odor: no Hematuria: no Abdominal pain: pinching sensation on occasion Back pain: no Suprapubic pain/pressure: no Flank pain: no Fever:  no Vomiting: no Status: better Previous urinary tract infection: yes Sexual activity: monogamous History of sexually transmitted disease: no Treatments attempted: increasing fluids     ANXIETY/DEPRESSION Taking 50 MG Zoloft  at this time. Mood status: stable Satisfied with current treatment?: yes Symptom severity: moderate  Duration of current treatment : years Side effects: no Medication compliance: good compliance Psychotherapy/counseling: none Previous psychiatric medications: as above Depressed mood: no Anxious mood: no Anhedonia: no Significant weight loss or gain: no Insomnia: none Fatigue: yes Feelings of worthlessness or guilt: no Impaired concentration/indecisiveness: no Suicidal ideations: no Hopelessness: no Crying spells: no    08/21/2024    4:42 PM 07/29/2024    1:06 PM 02/20/2024   11:08 AM 09/11/2023   11:27 AM 08/21/2023    9:45 AM  Depression screen PHQ 2/9  Decreased Interest 0 0 0 0 0  Down, Depressed, Hopeless 0 0 0 0 0  PHQ - 2 Score 0 0 0 0 0  Altered sleeping 0  0 0 0  Tired, decreased energy 0  0 0 0  Change in appetite 0  0 0 0  Feeling bad or failure about yourself  0  0 0 0  Trouble concentrating 0  0 0 0  Moving slowly or fidgety/restless 0  0 0 0  Suicidal  thoughts 0  0 0 0  PHQ-9 Score 0  0 0 0  Difficult doing work/chores Not difficult at all  Not difficult at all Not difficult at all Not difficult at all       08/21/2024    4:42 PM 07/29/2024    1:06 PM 02/20/2024   11:08 AM 08/21/2023    9:46 AM  GAD 7 : Generalized Anxiety Score  Nervous, Anxious, on Edge  0 0 0 0  Control/stop worrying 0 0 0 0  Worry too much - different things 0 0 0 0  Trouble relaxing 0 0 0 0  Restless 0 0 0 0  Easily annoyed or irritable 0 0 0 0  Afraid - awful might happen 0 0 0 0  Total GAD 7 Score 0 0 0 0  Anxiety Difficulty Not difficult at all  Not difficult at all Not difficult at all   Relevant past medical, surgical, family and social history reviewed and updated as indicated. Interim medical history since our last visit reviewed. Allergies and medications reviewed and updated.  Review of Systems  Constitutional:  Negative for activity change, appetite change, diaphoresis, fatigue and fever.  Respiratory:  Negative for cough, chest tightness and shortness of breath.   Cardiovascular:  Negative for chest pain, palpitations and leg swelling.  Gastrointestinal: Negative.   Endocrine: Negative for cold intolerance and heat intolerance.  Neurological:  Negative for dizziness, syncope, weakness, light-headedness, numbness and headaches.  Psychiatric/Behavioral: Negative.     Per HPI unless specifically indicated above     Objective:    BP 132/80   Pulse 60   Temp 98 F (36.7 C) (Oral)   Ht 5' 3.7 (1.618 m)   Wt 203 lb 9.6 oz (92.4 kg)   LMP  (LMP Unknown)   SpO2 98%   BMI 35.28 kg/m   Wt Readings from Last 3 Encounters:  08/21/24 203 lb 9.6 oz (92.4 kg)  02/20/24 203 lb 3.2 oz (92.2 kg)  09/11/23 193 lb (87.5 kg)    Physical Exam Vitals and nursing note reviewed.  Constitutional:      General: She is awake. She is not in acute distress.    Appearance: She is well-developed and well-groomed. She is not ill-appearing or toxic-appearing.  HENT:     Head: Normocephalic.     Right Ear: Hearing and external ear normal.     Left Ear: Hearing and external ear normal.  Eyes:     General: Lids are normal.        Right eye: No discharge.        Left eye: No discharge.     Conjunctiva/sclera: Conjunctivae normal.     Pupils: Pupils are equal, round,  and reactive to light.  Neck:     Thyroid : No thyromegaly.     Vascular: No carotid bruit.  Cardiovascular:     Rate and Rhythm: Normal rate and regular rhythm.     Heart sounds: Normal heart sounds. No murmur heard.    No gallop.  Pulmonary:     Effort: Pulmonary effort is normal. No accessory muscle usage or respiratory distress.     Breath sounds: Normal breath sounds.  Abdominal:     General: Bowel sounds are normal. There is no distension.     Palpations: Abdomen is soft.     Tenderness: There is no abdominal tenderness.  Musculoskeletal:     Cervical back: Normal range of motion and neck supple.     Right  lower leg: No edema.     Left lower leg: No edema.  Lymphadenopathy:     Cervical: No cervical adenopathy.  Skin:    General: Skin is warm and dry.  Neurological:     Mental Status: She is alert and oriented to person, place, and time.     Deep Tendon Reflexes: Reflexes are normal and symmetric.     Reflex Scores:      Brachioradialis reflexes are 2+ on the right side and 2+ on the left side.      Patellar reflexes are 2+ on the right side and 2+ on the left side. Psychiatric:        Attention and Perception: Attention normal.        Mood and Affect: Mood normal.        Speech: Speech normal.        Behavior: Behavior normal. Behavior is cooperative.        Thought Content: Thought content normal.    Results for orders placed or performed in visit on 08/21/24  Microscopic Examination   Collection Time: 08/21/24 10:51 AM   Urine  Result Value Ref Range   WBC, UA None seen 0 - 5 /hpf   RBC, Urine 0-2 0 - 2 /hpf   Epithelial Cells (non renal) 0-10 0 - 10 /hpf   Bacteria, UA None seen None seen/Few  Urinalysis, Routine w reflex microscopic   Collection Time: 08/21/24 10:51 AM  Result Value Ref Range   Specific Gravity, UA 1.020 1.005 - 1.030   pH, UA 7.5 5.0 - 7.5   Color, UA Yellow Yellow   Appearance Ur Clear Clear   Leukocytes,UA Negative Negative    Protein,UA Negative Negative/Trace   Glucose, UA Negative Negative   Ketones, UA Negative Negative   RBC, UA Trace (A) Negative   Bilirubin, UA Negative Negative   Urobilinogen, Ur 0.2 0.2 - 1.0 mg/dL   Nitrite, UA Negative Negative   Microscopic Examination See below:       Assessment & Plan:   Problem List Items Addressed This Visit       Cardiovascular and Mediastinum   Essential hypertension, benign   Chronic, stable.  BP at goal.  Continue current medication regimen and adjust as needed. Recommend checking BP at home three days a week + focus on DASH diet, could consider reduction of medication in future if BP stable.  LABS: BMP.  Urine ALB 07 February 2024, continue Losartan  for kidney protection.  Refills sent.  Return in 6 months.      Relevant Medications   losartan  (COZAAR ) 50 MG tablet   hydrochlorothiazide  (HYDRODIURIL ) 25 MG tablet   atorvastatin  (LIPITOR) 40 MG tablet   Other Relevant Orders   Basic metabolic panel with GFR   Aortic atherosclerosis   Chronic, stable.  Noted on lumbar imaging 11/04/21 -- continue statin daily and ASA daily for prevention.      Relevant Medications   losartan  (COZAAR ) 50 MG tablet   hydrochlorothiazide  (HYDRODIURIL ) 25 MG tablet   atorvastatin  (LIPITOR) 40 MG tablet     Digestive   GERD (gastroesophageal reflux disease)   Chronic.  Continue OTC Omeprazole  for now and consider reduction in future. Risks of PPI use were discussed with patient including bone loss, C. Diff diarrhea, pneumonia, infections, CKD, electrolyte abnormalities. Verbalizes understanding and chooses to continue the medication. Mag level at physical.         Endocrine   Hashimoto's thyroiditis   Chronic, stable.  Continue current medication regimen and adjust as needed.  Labs due at physical in April 2026.      Relevant Medications   levothyroxine  (SYNTHROID ) 112 MCG tablet     Other   Hyperlipidemia   Chronic, stable.  Continue current medication regimen  and adjust as needed.  Obtain CMP and lipid panel today.      Relevant Medications   losartan  (COZAAR ) 50 MG tablet   hydrochlorothiazide  (HYDRODIURIL ) 25 MG tablet   atorvastatin  (LIPITOR) 40 MG tablet   Other Relevant Orders   Lipid Panel w/o Chol/HDL Ratio   Generalized anxiety disorder   Chronic, stable.  Continue Sertraline  50 MG daily and consider reduction in future.  Denies SI/HI.      Chronic depression - Primary   Chronic, stable.  Denies SI/HI.  Continue current medication regimen and adjust as needed, may reduce further in future if tolerate.  Check LFTs today.        Other Visit Diagnoses       Urinary symptom or sign       UA with trace BLD only, will send for culture but suspect this may be related to some pelvic floor dysfuncton. Discussed with her.   Relevant Orders   Urinalysis, Routine w reflex microscopic (Completed)   Urine Culture        Follow up plan: Return in about 6 months (around 02/19/2025) for Annual Physical after 02/19/25.

## 2024-08-22 ENCOUNTER — Ambulatory Visit: Payer: Self-pay | Admitting: Nurse Practitioner

## 2024-08-22 LAB — BASIC METABOLIC PANEL WITH GFR
BUN/Creatinine Ratio: 21 (ref 12–28)
BUN: 19 mg/dL (ref 8–27)
CO2: 26 mmol/L (ref 20–29)
Calcium: 9.2 mg/dL (ref 8.7–10.3)
Chloride: 101 mmol/L (ref 96–106)
Creatinine, Ser: 0.92 mg/dL (ref 0.57–1.00)
Glucose: 86 mg/dL (ref 70–99)
Potassium: 3.7 mmol/L (ref 3.5–5.2)
Sodium: 139 mmol/L (ref 134–144)
eGFR: 68 mL/min/1.73 (ref >59)

## 2024-08-22 LAB — LIPID PANEL W/O CHOL/HDL RATIO
Cholesterol, Total: 156 mg/dL (ref 100–199)
HDL: 53 mg/dL (ref >39)
LDL Chol Calc (NIH): 80 mg/dL (ref 0–99)
Triglycerides: 131 mg/dL (ref 0–149)
VLDL Cholesterol Cal: 23 mg/dL (ref 5–40)

## 2024-08-24 LAB — URINE CULTURE

## 2024-08-24 NOTE — Progress Notes (Signed)
Contacted via MyChart   Urine showing no infection:)

## 2024-10-28 ENCOUNTER — Ambulatory Visit (INDEPENDENT_AMBULATORY_CARE_PROVIDER_SITE_OTHER): Admitting: Emergency Medicine

## 2024-10-28 VITALS — BP 116/66 | Ht 64.5 in | Wt 202.0 lb

## 2024-10-28 DIAGNOSIS — Z0000 Encounter for general adult medical examination without abnormal findings: Secondary | ICD-10-CM

## 2024-10-28 NOTE — Patient Instructions (Signed)
 Ms. Reynolds,  Thank you for taking the time for your Medicare Wellness Visit. I appreciate your continued commitment to your health goals. Please review the care plan we discussed, and feel free to reach out if I can assist you further.  Please note that Annual Wellness Visits do not include a physical exam. Some assessments may be limited, especially if the visit was conducted virtually. If needed, we may recommend an in-person follow-up with your provider.  Ongoing Care Seeing your primary care provider every 3 to 6 months helps us  monitor your health and provide consistent, personalized care.   Referrals If a referral was made during today's visit and you haven't received any updates within two weeks, please contact the referred provider directly to check on the status.  Recommended Screenings:  Health Maintenance  Topic Date Due   COVID-19 Vaccine (9 - 2025-26 season) 08/07/2024   Medicare Annual Wellness Visit  09/10/2024   Breast Cancer Screening  07/24/2025   Cologuard (Stool DNA test)  09/08/2025   Osteoporosis screening with Bone Density Scan  01/24/2027   DTaP/Tdap/Td vaccine (5 - Td or Tdap) 06/21/2032   Pneumococcal Vaccine for age over 62  Completed   Flu Shot  Completed   Hepatitis C Screening  Completed   Zoster (Shingles) Vaccine  Completed   Meningitis B Vaccine  Aged Out   Colon Cancer Screening  Discontinued       10/28/2024    4:09 PM  Advanced Directives  Does Patient Have a Medical Advance Directive? No  Would patient like information on creating a medical advance directive? No - Patient declined    Vision: Annual vision screenings are recommended for early detection of glaucoma, cataracts, and diabetic retinopathy. These exams can also reveal signs of chronic conditions such as diabetes and high blood pressure.  Dental: Annual dental screenings help detect early signs of oral cancer, gum disease, and other conditions linked to overall health, including  heart disease and diabetes.  Please see the attached documents for additional preventive care recommendations.

## 2024-10-28 NOTE — Progress Notes (Signed)
 "  Chief Complaint  Patient presents with   Medicare Wellness     Subjective:   Leslie Turner is a 68 y.o. female who presents for a Medicare Annual Wellness Visit.  Visit info / Clinical Intake: Medicare Wellness Visit Type:: Subsequent Annual Wellness Visit Persons participating in visit and providing information:: patient Medicare Wellness Visit Mode:: In-person (required for WTM) Interpreter Needed?: No Pre-visit prep was completed: yes AWV questionnaire completed by patient prior to visit?: yes Date:: 10/24/24 Living arrangements:: lives with spouse/significant other Patient's Overall Health Status Rating: very good Typical amount of pain: none Does pain affect daily life?: no Are you currently prescribed opioids?: no  Dietary Habits and Nutritional Risks How many meals a day?: 2 Eats fruit and vegetables daily?: yes Most meals are obtained by: preparing own meals In the last 2 weeks, have you had any of the following?: none Diabetic:: no  Functional Status Activities of Daily Living (to include ambulation/medication): Independent Ambulation: Independent with device- listed below Home Assistive Devices/Equipment: Contact lenses Medication Administration: Independent Home Management (perform basic housework or laundry): Independent Manage your own finances?: yes Primary transportation is: driving Concerns about vision?: no *vision screening is required for WTM* Concerns about hearing?: no  Fall Screening Falls in the past year?: 0 Number of falls in past year: 0 Was there an injury with Fall?: 0 Fall Risk Category Calculator: 0 Patient Fall Risk Level: Low Fall Risk  Fall Risk Patient at Risk for Falls Due to: No Fall Risks Fall risk Follow up: Falls evaluation completed  Home and Transportation Safety: All rugs have non-skid backing?: yes All stairs or steps have railings?: yes Grab bars in the bathtub or shower?: yes Have non-skid surface in bathtub or  shower?: yes Good home lighting?: yes Regular seat belt use?: yes Hospital stays in the last year:: (!) yes How many hospital stays:: 2 Reason: 2 knee replacements  Cognitive Assessment Difficulty concentrating, remembering, or making decisions? : no Will 6CIT or Mini Cog be Completed: yes What year is it?: 0 points What month is it?: 0 points Give patient an address phrase to remember (5 components): [address] [place] About what time is it?: 0 points Count backwards from 20 to 1: 0 points Say the months of the year in reverse: 0 points Repeat the address phrase from earlier: 0 points 6 CIT Score: 0 points  Advance Directives (For Healthcare) Does Patient Have a Medical Advance Directive?: No Would patient like information on creating a medical advance directive?: No - Patient declined  Reviewed/Updated  Reviewed/Updated: Reviewed All (Medical, Surgical, Family, Medications, Allergies, Care Teams, Patient Goals)    Allergies (verified) Patient has no known allergies.   Current Medications (verified) Outpatient Encounter Medications as of 10/28/2024  Medication Sig   atorvastatin  (LIPITOR) 40 MG tablet Take 1 tablet (40 mg total) by mouth daily.   cholecalciferol (VITAMIN D ) 1000 UNITS tablet Take 1,000 Units by mouth daily.   hydrochlorothiazide  (HYDRODIURIL ) 25 MG tablet Take 0.5 tablets (12.5 mg total) by mouth daily.   levothyroxine  (SYNTHROID ) 112 MCG tablet Take 1 tablet (112 mcg total) by mouth daily before breakfast.   losartan  (COZAAR ) 50 MG tablet Take 1 tablet (50 mg total) by mouth daily.   Multiple Vitamins-Minerals (CENTRUM SILVER 50+WOMEN PO) Take 1 tablet by mouth daily.   omeprazole  (PRILOSEC) 20 MG capsule Take 20 mg by mouth daily.   sertraline  (ZOLOFT ) 100 MG tablet Take 0.5 tablets (50 mg total) by mouth daily.  No facility-administered encounter medications on file as of 10/28/2024.    History: Past Medical History:  Diagnosis Date   Anemia     Anxiety    Aortic atherosclerosis    Carotid stenosis    Depression    Dyspnea    GERD (gastroesophageal reflux disease)    Hyperlipidemia    Hypertension    Hypothyroidism    Metabolic syndrome    Microscopic hematuria    Pain in knee    Primary osteoarthritis of left knee    Vitamin D  deficiency    Past Surgical History:  Procedure Laterality Date   APPENDECTOMY     COLONOSCOPY W/ POLYPECTOMY     ENDOMETRIAL BIOPSY  06/15/2009   disordered prolif. phase   FOOT NEUROMA SURGERY Left    JOINT REPLACEMENT     Left knee 02-2024. Right  knee 06-2024   KNEE ARTHROPLASTY Left 03/02/2023   Procedure: COMPUTER ASSISTED TOTAL KNEE ARTHROPLASTY - RNFA;  Surgeon: Mardee Lynwood SQUIBB, MD;  Location: [facility] ORS;  Service: Orthopedics;  Laterality: Left;   KNEE ARTHROPLASTY Right 07/25/2023   Procedure: COMPUTER ASSISTED TOTAL KNEE ARTHROPLASTY - RNFA;  Surgeon: Mardee Lynwood SQUIBB, MD;  Location: [facility] OR[facility];  Service: Orthopedics;  Laterality: Right;   Family History  Problem Relation Age of Onset   Arthritis Mother    Cancer Mother 80       colon   Diabetes Father    Heart disease Father    Hypertension Father    Breast cancer Paternal Aunt    Heart disease Paternal Grandfather        MI   Heart disease Brother    Lung cancer Brother    Social History   Occupational History   Occupation: retired  Tobacco Use   Smoking status: Former    Current packs/day: 0.00    Average packs/day: 0.5 packs/day for 13.0 years (6.5 ttl pk-yrs)    Types: Cigarettes    Start date: 73    Quit date: 1986    Years since quitting: 40.0   Smokeless tobacco: Never   Tobacco comments:    smoked for 12-14 years and quit cold turkey  Vaping Use   Vaping status: Never Used  Substance and Sexual Activity   Alcohol use: Not Currently    Comment: rare   Drug use: No   Sexual activity: Not Currently    Partners: Male   Tobacco Counseling Counseling given: Not Answered Tobacco comments: smoked for  12-14 years and quit cold turkey  SDOH Screenings   Food Insecurity: No Food Insecurity (10/28/2024)  Housing: Low Risk (10/28/2024)  Transportation Needs: No Transportation Needs (10/28/2024)  Utilities: Not At Risk (10/28/2024)  Alcohol Screen: Low Risk (08/17/2024)  Depression (PHQ2-9): Low Risk (10/28/2024)  Financial Resource Strain: Low Risk (08/17/2024)  Physical Activity: Inactive (10/28/2024)  Social Connections: Moderately Integrated (10/28/2024)  Recent Concern: Social Connections - Moderately Isolated (08/17/2024)  Stress: No Stress Concern Present (10/28/2024)  Tobacco Use: Medium Risk (10/28/2024)  Health Literacy: Adequate Health Literacy (10/28/2024)   See flowsheets for full screening details  Depression Screen PHQ 2 & 9 Depression Scale- Over the past 2 weeks, how often have you been bothered by any of the following problems? Little interest or pleasure in doing things: 0 Feeling down, depressed, or hopeless (PHQ Adolescent also includes...irritable): 0 PHQ-2 Total Score: 0 Trouble falling or staying asleep, or sleeping too much: 0 Feeling tired or having little energy: 0 Poor appetite or overeating (PHQ Adolescent also  includes...weight loss): 0 Feeling bad about yourself - or that you are a failure or have let yourself or your family down: 0 Trouble concentrating on things, such as reading the newspaper or watching television (PHQ Adolescent also includes...like school work): 0 Moving or speaking so slowly that other people could have noticed. Or the opposite - being so fidgety or restless that you have been moving around a lot more than usual: 0 Thoughts that you would be better off dead, or of hurting yourself in some way: 0 PHQ-9 Total Score: 0 If you checked off any problems, how difficult have these problems made it for you to do your work, take care of things at home, or get along with other people?: Not difficult at all  Depression Treatment Depression  Interventions/Treatment : EYV7-0 Score <4 Follow-up Not Indicated; Currently on Treatment     Goals Addressed               This Visit's Progress     COMPLETED: Patient Stated (pt-stated)        Continue to stay active and exercise      Weight (lb) < 190 lb (86.2 kg) (pt-stated)   202 lb (91.6 kg)            Objective:    Today's Vitals   10/28/24 1557  BP: 116/66  Weight: 202 lb (91.6 kg)  Height: 5' 4.5 (1.638 m)   Body mass index is 34.14 kg/m.  Hearing/Vision screen Hearing Screening - Comments:: Denies hearing loss  Vision Screening - Comments:: UTD w/ Dr. Prentice Sick, [facility]cal[facility] Immunizations and Health Maintenance Health Maintenance  Topic Date Due   COVID-19 Vaccine [phone] - 2025-26 season) 08/07/2024   Mammogram  07/24/2025   Fecal DNA (Cologuard)  09/08/2025   Medicare Annual Wellness (AWV)  10/28/2025   Bone Density Scan  01/24/2027   DTaP/Tdap/Td (5 - Td or Tdap) 06/21/2032   Pneumococcal Vaccine: 50+ Years  Completed   Influenza Vaccine  Completed   Hepatitis C Screening  Completed   Zoster Vaccines- Shingrix  Completed   Meningococcal B Vaccine  Aged Out   Colonoscopy  Discontinued        Assessment/Plan:  This is a routine wellness examination for [facility].  Patient Care Team: Valerio Melanie DASEN, NP as PCP - General (Nurse Practitioner) Mardee Lynwood SQUIBB, MD (Orthopedic Surgery) Dr. Prentice Sick [facility])  I have personally reviewed and noted the following in the patients chart:   Medical and social history Use of alcohol, tobacco or illicit drugs  Current medications and supplements including opioid prescriptions. Functional ability and status Nutritional status Physical activity Advanced directives List of other physicians Hospitalizations, surgeries, and ER visits in previous 12 months Vitals Screenings to include cognitive, depression, and falls Referrals and appointments  No orders of the defined types were  placed in this encounter.  In addition, I have reviewed and discussed with patient certain preventive protocols, quality metrics, and best practice recommendations. A written personalized care plan for preventive services as well as general preventive health recommendations were provided to patient.   Vina Ned, CMA   10/28/2024   Return in 53 weeks (on 11/03/2025) for Medicare Annual Wellness Visit.  After Visit Summary: (In Person-Declined) Patient declined AVS at this time.  Nurse Notes:  No voiced or noted concerns at this time  "

## 2024-11-27 ENCOUNTER — Encounter: Payer: Self-pay | Admitting: Nurse Practitioner

## 2024-11-28 ENCOUNTER — Telehealth: Admitting: Nurse Practitioner

## 2024-11-28 ENCOUNTER — Encounter: Payer: Self-pay | Admitting: Nurse Practitioner

## 2024-11-28 DIAGNOSIS — Z20828 Contact with and (suspected) exposure to other viral communicable diseases: Secondary | ICD-10-CM | POA: Diagnosis not present

## 2024-11-28 MED ORDER — OSELTAMIVIR PHOSPHATE 75 MG PO CAPS: 75.0000 mg | ORAL_CAPSULE | Freq: Two times a day (BID) | ORAL | 0 refills | Status: AC

## 2024-11-28 NOTE — Assessment & Plan Note (Signed)
 To grand daughter who has influenza A. She is having mild symptoms. Will start Tamiflu  BID for 5 days. Educated her on this medication and side effects. Strict ER precautions given. Ensure plenty of rest and fluids + remain inside until symptoms improve.

## 2024-11-28 NOTE — Patient Instructions (Signed)
 The Flu (Influenza) in Adults: What to Know Influenza is also called the flu. It's an infection that affects your respiratory tract. This includes your nose, throat, windpipe, and lungs. The flu is contagious. This means it spreads easily from person to person. It causes symptoms that are like a cold. It can also cause a high fever and body aches. What are the causes? The flu is caused by the influenza virus. You can get it by: Breathing in droplets that are in the air after an infected person coughs or sneezes. Touching something that has the virus on it and then touching your mouth, nose, or eyes. What increases the risk? You may be more likely to get the flu if: You don't wash your hands often. You're near a lot of people during cold and flu season. You touch your mouth, eyes, or nose without washing your hands first. You don't get a flu shot each year. You may also be more at risk for the flu and serious problems, such as a lung infection called pneumonia, if: You're older than 65. You're pregnant. Your immune system is weak. Your immune system is your body's defense system. You have a long-term, or chronic, condition, such as: Heart, kidney, or lung disease. Diabetes. A liver disorder. Asthma. You're very overweight. You have anemia. This is when you don't have enough red blood cells in your body. What are the signs or symptoms? Flu symptoms often start all of a sudden. They may last 4-14 days and include: Fever and chills. Headaches, body aches, or muscle aches. Sore throat. Cough. Runny or stuffy nose. Discomfort in your chest. Not wanting to eat as much as normal. Feeling weak or tired. Feeling dizzy. Nausea or vomiting. How is this diagnosed? The flu may be diagnosed based on your symptoms and medical history. You may also have a physical exam. A swab may be taken from your nose or throat and tested for the virus. How is this treated? If the flu is found early, you can  be treated with antiviral medicine. This may be given to you by mouth or through an IV. It can help you feel less sick and get better faster. Taking care of yourself at home can also help your symptoms get better. Your health care provider may tell you to: Take over-the-counter medicines. Drink lots of fluids. The flu often goes away on its own. If you have very bad symptoms or problems caused by the flu, you may need to be treated in a hospital. Follow these instructions at home: Activity Rest as needed. Get lots of sleep. Stay home from work or school as told by your provider. Leave home only to go see your provider. Do not leave home for other reasons until you don't have a fever for 24 hours without taking medicine. Eating and drinking Take an oral rehydration solution (ORS). This is a drink that is sold at pharmacies and stores. Drink enough fluid to keep your pee pale yellow. Try to drink small amounts of clear fluids. These include water, ice chips, fruit juice mixed with water, and low-calorie sports drinks. Try to eat bland foods that are easy to digest. These include bananas, applesauce, rice, lean meats, toast, and crackers. Avoid drinks that have a lot of sugar or caffeine in them. These include energy drinks, regular sports drinks, and soda. Do not drink alcohol. Do not eat spicy or fatty foods. General instructions     Take your medicines only as told by your  provider. Use a cool mist humidifier to add moisture to the air in your home. This can make it easier for you to breathe. You should also clean the humidifier every day. To do so: Empty the water. Pour clean water in. Cover your mouth and nose when you cough or sneeze. Wash your hands with soap and water often and for at least 20 seconds. It's extra important to do so after you cough or sneeze. If you can't use soap and water, use hand sanitizer. How is this prevented?  Get a flu shot every year. Ask your provider  when you should get your flu shot. Stay away from people who are sick during fall and winter. Fall and winter are cold and flu season. Contact a health care provider if: You get new symptoms. You have chest pain. You have watery poop, also called diarrhea. You have a fever. Your cough gets worse. You start to have more mucus. You feel like you may vomit, or you vomit. Get help right away if: You become short of breath or have trouble breathing. Your skin or nails turn blue. You have very bad pain or stiffness in your neck. You get a sudden headache or pain in your face or ear. You vomit each time you eat or drink. These symptoms may be an emergency. Call 911 right away. Do not wait to see if the symptoms will go away. Do not drive yourself to the hospital. This information is not intended to replace advice given to you by your health care provider. Make sure you discuss any questions you have with your health care provider. Document Revised: 08/26/2024 Document Reviewed: 11/23/2022 Elsevier Patient Education  2025 [employer].

## 2024-11-28 NOTE — Progress Notes (Signed)
 "  LMP  (LMP Unknown)    Subjective:    Patient ID: Leslie Turner, female    DOB: [birth year], 69 y.o.   MRN: [id number]  HPI: Leslie Turner is a 69 y.o. female  Chief Complaint  Patient presents with   Flu Exposure    Patient states she has been exposed to her grand daughter who was diagnosed with the flu yesterday, no current symptoms per patient   Virtual Visit via Video Note  I connected with DANICA CAMARENA on 11/28/24 at 10:20 AM EST by a video enabled telemedicine application and verified that I am speaking with the correct person using two identifiers.  Location: Patient: home Provider: work   I discussed the limitations of evaluation and management by telemedicine and the availability of in person appointments. The patient expressed understanding and agreed to proceed.  I discussed the assessment and treatment plan with the patient. The patient was provided an opportunity to ask questions and all were answered. The patient agreed with the plan and demonstrated an understanding of the instructions.   The patient was advised to call back or seek an in-person evaluation if the symptoms worsen or if the condition fails to improve as anticipated.  I provided 25 minutes of non-face-to-face time during this encounter.   Matthe Sloane T Magdalyn Arenivas, NP   FLU EXPOSURE Was exposed to grand daughter who has the flu A. No current symptoms, with exception of mild sore throat earlier. Fever: no Cough: mild Shortness of breath: no Wheezing: no Chest pain: no Chest tightness: no Chest congestion: no Nasal congestion: no Runny nose: no Post nasal drip: no Sneezing: no Sore throat: mild earlier this morning Swollen glands: no Sinus pressure: no Headache: no Face pain: no Toothache: no Ear pain: none Ear pressure: none Eyes red/itching:no Eye drainage/crusting: no  Vomiting: no Rash: no Fatigue: no Sick contacts: yes Strep contacts: no  Context: stable Recurrent sinusitis:  no  Relevant past medical, surgical, family and social history reviewed and updated as indicated. Interim medical history since our last visit reviewed. Allergies and medications reviewed and updated.  Review of Systems  Constitutional:  Negative for activity change, appetite change, diaphoresis, fatigue and fever.  HENT:  Positive for sore throat.   Respiratory:  Positive for cough. Negative for chest tightness, shortness of breath and wheezing.   Cardiovascular:  Negative for chest pain, palpitations and leg swelling.  Gastrointestinal: Negative.   Endocrine: Negative for cold intolerance and heat intolerance.  Neurological:  Negative for dizziness, syncope, weakness, light-headedness, numbness and headaches.  Psychiatric/Behavioral: Negative.      Per HPI unless specifically indicated above     Objective:    LMP  (LMP Unknown)   Wt Readings from Last 3 Encounters:  10/28/24 202 lb (91.6 kg)  08/21/24 203 lb 9.6 oz (92.4 kg)  02/20/24 203 lb 3.2 oz (92.2 kg)    Physical Exam Vitals and nursing note reviewed.  Constitutional:      General: She is awake. She is not in acute distress.    Appearance: She is well-developed. She is not ill-appearing.  HENT:     Head: Normocephalic.     Right Ear: Hearing normal.     Left Ear: Hearing normal.  Eyes:     General: Lids are normal.        Right eye: No discharge.        Left eye: No discharge.     Conjunctiva/sclera: Conjunctivae normal.  Pulmonary:  Effort: Pulmonary effort is normal. No accessory muscle usage or respiratory distress.  Musculoskeletal:     Cervical back: Normal range of motion.  Neurological:     Mental Status: She is alert and oriented to person, place, and time.  Psychiatric:        Attention and Perception: Attention normal.        Mood and Affect: Mood normal.        Behavior: Behavior normal. Behavior is cooperative.        Thought Content: Thought content normal.        Judgment: Judgment normal.      Results for orders placed or performed in visit on 08/21/24  Microscopic Examination   Collection Time: 08/21/24 10:51 AM   Urine  Result Value Ref Range   WBC, UA None seen 0 - 5 /hpf   RBC, Urine 0-2 0 - 2 /hpf   Epithelial Cells (non renal) 0-10 0 - 10 /hpf   Bacteria, UA None seen None seen/Few  Urinalysis, Routine w reflex microscopic   Collection Time: 08/21/24 10:51 AM  Result Value Ref Range   Specific Gravity, UA 1.020 1.005 - 1.030   pH, UA 7.5 5.0 - 7.5   Color, UA Yellow Yellow   Appearance Ur Clear Clear   Leukocytes,UA Negative Negative   Protein,UA Negative Negative/Trace   Glucose, UA Negative Negative   Ketones, UA Negative Negative   RBC, UA Trace (A) Negative   Bilirubin, UA Negative Negative   Urobilinogen, Ur 0.2 0.2 - 1.0 mg/dL   Nitrite, UA Negative Negative   Microscopic Examination See below:   Lipid Panel w/o Chol/HDL Ratio   Collection Time: 08/21/24 10:56 AM  Result Value Ref Range   Cholesterol, Total 156 100 - 199 mg/dL   Triglycerides 868 0 - 149 mg/dL   HDL 53 >60 mg/dL   VLDL Cholesterol Cal 23 5 - 40 mg/dL   LDL Chol Calc (NIH) 80 0 - 99 mg/dL  Basic metabolic panel with GFR   Collection Time: 08/21/24 10:56 AM  Result Value Ref Range   Glucose 86 70 - 99 mg/dL   BUN 19 8 - 27 mg/dL   Creatinine, Ser 9.07 0.57 - 1.00 mg/dL   eGFR 68 >40 fO/fpw/8.26   BUN/Creatinine Ratio 21 12 - 28   Sodium 139 134 - 144 mmol/L   Potassium 3.7 3.5 - 5.2 mmol/L   Chloride 101 96 - 106 mmol/L   CO2 26 20 - 29 mmol/L   Calcium  9.2 8.7 - 10.3 mg/dL  Urine Culture   Collection Time: 08/22/24  8:25 AM   Specimen: Urine   UR  Result Value Ref Range   Urine Culture, Routine Final report    Organism ID, Bacteria Comment       Assessment & Plan:   Problem List Items Addressed This Visit       Other   Exposure to the flu - Primary   To grand daughter who has influenza A. She is having mild symptoms. Will start Tamiflu  BID for 5 days.  Educated her on this medication and side effects. Strict ER precautions given. Ensure plenty of rest and fluids + remain inside until symptoms improve.        Follow up plan: Return if symptoms worsen or fail to improve.      "

## 2024-12-03 ENCOUNTER — Encounter: Payer: Self-pay | Admitting: Nurse Practitioner

## 2024-12-03 MED ORDER — AZITHROMYCIN 250 MG PO TABS: ORAL_TABLET | ORAL | 0 refills | Status: AC

## 2025-02-20 ENCOUNTER — Ambulatory Visit: Admitting: Nurse Practitioner

## 2025-11-03 ENCOUNTER — Ambulatory Visit
